# Patient Record
Sex: Female | Born: 1945 | Race: Black or African American | Hispanic: No | Marital: Married | Smoking: Never smoker
Health system: Southern US, Community
[De-identification: ages and names within clinical notes are randomized; demographics above are authoritative.]

## PROBLEM LIST (undated history)

## (undated) DIAGNOSIS — J189 Pneumonia, unspecified organism: Secondary | ICD-10-CM

## (undated) HISTORY — PX: ABDOMINAL HYSTERECTOMY: SHX81

---

## 1999-01-11 ENCOUNTER — Ambulatory Visit (HOSPITAL_COMMUNITY): Admission: RE | Admit: 1999-01-11 | Discharge: 1999-01-11 | Payer: Self-pay | Admitting: Family Medicine

## 1999-01-11 ENCOUNTER — Encounter: Payer: Self-pay | Admitting: Family Medicine

## 2013-07-12 ENCOUNTER — Emergency Department (HOSPITAL_COMMUNITY): Payer: Medicare Other

## 2013-07-12 ENCOUNTER — Inpatient Hospital Stay (HOSPITAL_COMMUNITY)
Admission: EM | Admit: 2013-07-12 | Discharge: 2013-07-14 | DRG: 195 | Disposition: A | Discharge status: Home / Self Care | Payer: Medicare Other | Attending: Internal Medicine | Admitting: Internal Medicine

## 2013-07-12 ENCOUNTER — Encounter (HOSPITAL_COMMUNITY): Payer: Self-pay | Admitting: Emergency Medicine

## 2013-07-12 ENCOUNTER — Observation Stay (HOSPITAL_COMMUNITY): Payer: Medicare Other

## 2013-07-12 DIAGNOSIS — Z8701 Personal history of pneumonia (recurrent): Secondary | ICD-10-CM

## 2013-07-12 DIAGNOSIS — I498 Other specified cardiac arrhythmias: Secondary | ICD-10-CM | POA: Diagnosis present

## 2013-07-12 DIAGNOSIS — Z8249 Family history of ischemic heart disease and other diseases of the circulatory system: Secondary | ICD-10-CM

## 2013-07-12 DIAGNOSIS — Z833 Family history of diabetes mellitus: Secondary | ICD-10-CM

## 2013-07-12 DIAGNOSIS — R7881 Bacteremia: Secondary | ICD-10-CM

## 2013-07-12 DIAGNOSIS — J189 Pneumonia, unspecified organism: Principal | ICD-10-CM

## 2013-07-12 DIAGNOSIS — Z823 Family history of stroke: Secondary | ICD-10-CM

## 2013-07-12 DIAGNOSIS — E876 Hypokalemia: Secondary | ICD-10-CM

## 2013-07-12 DIAGNOSIS — D649 Anemia, unspecified: Secondary | ICD-10-CM

## 2013-07-12 HISTORY — DX: Pneumonia, unspecified organism: J18.9

## 2013-07-12 LAB — COMPREHENSIVE METABOLIC PANEL
ALT: 12 U/L (ref 0–35)
AST: 17 U/L (ref 0–37)
Albumin: 3.2 g/dL — ABNORMAL LOW (ref 3.5–5.2)
Alkaline Phosphatase: 81 U/L (ref 39–117)
BUN: 18 mg/dL (ref 6–23)
CO2: 22 mEq/L (ref 19–32)
Calcium: 8.6 mg/dL (ref 8.4–10.5)
Chloride: 107 mEq/L (ref 96–112)
Creatinine, Ser: 0.75 mg/dL (ref 0.50–1.10)
GFR calc Af Amer: >90 mL/min (ref >90)
GFR calc non Af Amer: 87 mL/min — ABNORMAL LOW (ref >90)
Glucose, Bld: 142 mg/dL — ABNORMAL HIGH (ref 70–99)
Potassium: 3.3 mEq/L — ABNORMAL LOW (ref 3.5–5.1)
Sodium: 140 mEq/L (ref 135–145)
Total Bilirubin: 0.7 mg/dL (ref 0.3–1.2)
Total Protein: 7.3 g/dL (ref 6.0–8.3)

## 2013-07-12 LAB — CBC WITH DIFFERENTIAL/PLATELET
Basophils Absolute: 0 10*3/uL (ref 0.0–0.1)
Basophils Relative: 0 % (ref 0–1)
Eosinophils Absolute: 0 10*3/uL (ref 0.0–0.7)
Eosinophils Relative: 0 % (ref 0–5)
HCT: 32.6 % — ABNORMAL LOW (ref 36.0–46.0)
Hemoglobin: 11 g/dL — ABNORMAL LOW (ref 12.0–15.0)
Lymphocytes Relative: 6 % — ABNORMAL LOW (ref 12–46)
Lymphs Abs: 0.6 10*3/uL — ABNORMAL LOW (ref 0.7–4.0)
MCH: 29.3 pg (ref 26.0–34.0)
MCHC: 33.7 g/dL (ref 30.0–36.0)
MCV: 86.9 fL (ref 78.0–100.0)
Monocytes Absolute: 0.4 10*3/uL (ref 0.1–1.0)
Monocytes Relative: 4 % (ref 3–12)
Neutro Abs: 10.2 10*3/uL — ABNORMAL HIGH (ref 1.7–7.7)
Neutrophils Relative %: 91 % — ABNORMAL HIGH (ref 43–77)
Platelets: 192 10*3/uL (ref 150–400)
RBC: 3.75 MIL/uL — ABNORMAL LOW (ref 3.87–5.11)
RDW: 13.8 % (ref 11.5–15.5)
WBC: 11.2 10*3/uL — ABNORMAL HIGH (ref 4.0–10.5)

## 2013-07-12 LAB — INFLUENZA PANEL BY PCR (TYPE A & B)
Influenza A By PCR: NEGATIVE
Influenza B By PCR: NEGATIVE

## 2013-07-12 LAB — CG4 I-STAT (LACTIC ACID): Lactic Acid, Venous: 1.16 mmol/L (ref 0.5–2.2)

## 2013-07-12 LAB — HIV ANTIBODY (ROUTINE TESTING W REFLEX): HIV: NONREACTIVE

## 2013-07-12 LAB — TROPONIN I: Troponin I: <0.3 ng/mL (ref <0.30)

## 2013-07-12 MED ORDER — AZITHROMYCIN 250 MG PO TABS
500.0000 mg | ORAL_TABLET | Freq: Once | ORAL | Status: AC
  Administered 2013-07-12: 500 mg via ORAL
  Filled 2013-07-12: qty 2

## 2013-07-12 MED ORDER — SODIUM CHLORIDE 0.9 % IV SOLN
INTRAVENOUS | Status: AC
  Administered 2013-07-12: 23:00:00 via INTRAVENOUS

## 2013-07-12 MED ORDER — AZITHROMYCIN 500 MG PO TABS
500.0000 mg | ORAL_TABLET | ORAL | Status: DC
  Administered 2013-07-13 – 2013-07-14 (×2): 500 mg via ORAL
  Filled 2013-07-12 (×2): qty 1

## 2013-07-12 MED ORDER — SODIUM CHLORIDE 0.9 % IV BOLUS (SEPSIS)
500.0000 mL | Freq: Once | INTRAVENOUS | Status: AC
  Administered 2013-07-12: 500 mL via INTRAVENOUS

## 2013-07-12 MED ORDER — SODIUM CHLORIDE 0.9 % IV SOLN
INTRAVENOUS | Status: AC
  Administered 2013-07-12: 12:00:00 via INTRAVENOUS

## 2013-07-12 MED ORDER — NAPROXEN 500 MG PO TABS
500.0000 mg | ORAL_TABLET | Freq: Two times a day (BID) | ORAL | Status: DC
  Administered 2013-07-12 – 2013-07-14 (×5): 500 mg via ORAL
  Filled 2013-07-12 (×8): qty 1

## 2013-07-12 MED ORDER — ENOXAPARIN SODIUM 40 MG/0.4ML ~~LOC~~ SOLN
40.0000 mg | SUBCUTANEOUS | Status: DC
  Administered 2013-07-12 – 2013-07-14 (×3): 40 mg via SUBCUTANEOUS
  Filled 2013-07-12 (×3): qty 0.4

## 2013-07-12 MED ORDER — POTASSIUM CHLORIDE CRYS ER 20 MEQ PO TBCR
40.0000 meq | EXTENDED_RELEASE_TABLET | Freq: Once | ORAL | Status: AC
  Administered 2013-07-12: 40 meq via ORAL
  Filled 2013-07-12: qty 2

## 2013-07-12 MED ORDER — ONDANSETRON HCL 4 MG/2ML IJ SOLN
4.0000 mg | Freq: Once | INTRAMUSCULAR | Status: AC
  Administered 2013-07-12: 4 mg via INTRAVENOUS
  Filled 2013-07-12: qty 2

## 2013-07-12 MED ORDER — ALBUTEROL SULFATE HFA 108 (90 BASE) MCG/ACT IN AERS
2.0000 | INHALATION_SPRAY | Freq: Four times a day (QID) | RESPIRATORY_TRACT | Status: AC
  Administered 2013-07-12 (×2): 2 via RESPIRATORY_TRACT
  Filled 2013-07-12 (×2): qty 6.7

## 2013-07-12 MED ORDER — DEXTROSE 5 % IV SOLN
1.0000 g | INTRAVENOUS | Status: DC
  Filled 2013-07-12: qty 10

## 2013-07-12 MED ORDER — DEXTROSE 5 % IV SOLN
1.0000 g | Freq: Once | INTRAVENOUS | Status: AC
  Administered 2013-07-12: 1 g via INTRAVENOUS
  Filled 2013-07-12: qty 10

## 2013-07-12 MED ORDER — PNEUMOCOCCAL VAC POLYVALENT 25 MCG/0.5ML IJ INJ
0.5000 mL | INJECTION | INTRAMUSCULAR | Status: DC
  Filled 2013-07-12: qty 0.5

## 2013-07-12 MED ORDER — MORPHINE SULFATE 2 MG/ML IJ SOLN
2.0000 mg | Freq: Once | INTRAMUSCULAR | Status: AC
  Administered 2013-07-12: 2 mg via INTRAVENOUS
  Filled 2013-07-12: qty 1

## 2013-07-12 NOTE — Progress Notes (Signed)
Pharmacy consult to renal adjust antibiotics.  Patient currently on Rocephin and Azithromycin for pneumonia. No need for renal adjustment and SCr remains stable. All other medications are appropriately dosed.   Pharmacy will sign off. Please re- consult if needed.   Link Snuffer, PharmD, BCPS Clinical Pharmacist [phone] 07/12/2013, 11:24 AM

## 2013-07-12 NOTE — ED Provider Notes (Signed)
CSN: [id number]     Arrival date & time 07/12/13  0604 History   First MD Initiated Contact with Patient 07/12/13 [phone]     Chief Complaint  Patient presents with  . Chest Pain   (Consider location/radiation/quality/duration/timing/severity/associated sxs/prior Treatment) Patient is a 67 y.o. female presenting with chest pain. The history is provided by the patient.  Chest Pain Pain location:  Substernal area Pain quality: pressure and tightness   Pain radiates to:  Does not radiate Pain radiates to the back: no   Pain severity:  Moderate Onset quality:  Gradual Duration:  8 hours Timing:  Constant Progression:  Unchanged Chronicity:  New Context: breathing, movement and at rest   Relieved by:  Nothing Worsened by:  Nothing tried Ineffective treatments:  None tried Associated symptoms: cough, fever, nausea, shortness of breath and vomiting   Associated symptoms: no abdominal pain, no anxiety, no dizziness, no dysphagia, no headache, no numbness, no orthopnea, no syncope and no weakness    Patient brought to the ED by EMS with complains of chest pain with SOB, nausea and vomiting that started last night.  PMH is significant for abdominal hysterectomy. She denies having chronic medical conditions for which she takes medications for on a daily basis.  History reviewed. No pertinent past medical history. Past Surgical History  Procedure Laterality Date  . Abdominal hysterectomy     No family history on file. History  Substance Use Topics  . Smoking status: Never Smoker   . Smokeless tobacco: Not on file  . Alcohol Use: No   OB History   Grav Para Term Preterm Abortions TAB SAB Ect Mult Living                 Review of Systems  Constitutional: Positive for fever.  HENT: Negative for trouble swallowing.   Respiratory: Positive for cough and shortness of breath.   Cardiovascular: Positive for chest pain. Negative for orthopnea and syncope.  Gastrointestinal: Positive for  nausea and vomiting. Negative for abdominal pain.  Neurological: Negative for dizziness, weakness, numbness and headaches.    Allergies  Review of patient's allergies indicates no known allergies.  Home Medications  No current outpatient prescriptions on file. BP 112/61  Pulse 102  Temp(Src) 100.3 F (37.9 C) (Oral)  Resp 14  SpO2 92% Physical Exam  Nursing note and vitals reviewed. Constitutional: She appears well-developed and well-nourished. She appears distressed.  HENT:  Head: Normocephalic and atraumatic.  Eyes: Pupils are equal, round, and reactive to light.  Neck: Normal range of motion. Neck supple.  Cardiovascular: Regular rhythm.  Tachycardia present.   Abdominal: Soft. There is no tenderness. There is no guarding.  Neurological: She is alert.  Skin: Skin is warm and dry.    ED Course  Procedures (including critical care time) Labs Review Labs Reviewed  CBC WITH DIFFERENTIAL - Abnormal; Notable for the following:    WBC 11.2 (*)    RBC 3.75 (*)    Hemoglobin 11.0 (*)    HCT 32.6 (*)    Neutrophils Relative % 91 (*)    Neutro Abs 10.2 (*)    Lymphocytes Relative 6 (*)    Lymphs Abs 0.6 (*)    All other components within normal limits  COMPREHENSIVE METABOLIC PANEL - Abnormal; Notable for the following:    Potassium 3.3 (*)    Glucose, Bld 142 (*)    Albumin 3.2 (*)    GFR calc non Af Amer 87 (*)    All  other components within normal limits  PRO B NATRIURETIC PEPTIDE - Abnormal; Notable for the following:    Pro B Natriuretic peptide (BNP) 179.6 (*)    All other components within normal limits  CULTURE, BLOOD (ROUTINE X 2)  CULTURE, BLOOD (ROUTINE X 2)  TROPONIN I  URINALYSIS, ROUTINE W REFLEX MICROSCOPIC  INFLUENZA PANEL BY PCR  CG4 I-STAT (LACTIC ACID)   Imaging Review Dg Chest Port 1 View  07/12/2013   CLINICAL DATA:  Shortness of breath, cough, fever  EXAM: PORTABLE CHEST - 1 VIEW  COMPARISON:  None.  FINDINGS: Cardiac and mediastinal  silhouettes are within normal limits.  Lungs are normally inflated. Asymmetric parenchymal opacity is present within the left lower lobe, worrisome for pneumonia. A small left parapneumonic effusion is suspected. The right lung is clear. No pulmonary edema. No pneumothorax.  Osseous structures are within normal limits.  IMPRESSION: 1. Left lower lobe infiltrate, worrisome for pneumonia. 2. Suspect small left parapneumonic effusion.   Electronically Signed   By: Rise Mu M.D.   On: 07/12/2013 07:03    EKG Interpretation   None       MDM   1. CAP (community acquired pneumonia)    ALISA, STJAMES ZO:[id number] 12-Jul-2013 06:08:30 [facility]/ED ROUTINE RECORD Sinus tachycardia Otherwise normal ECG 43mm/s 56mm/mV 100Hz  8.0.1 12SL 237 CID: 2 Vent. rate 105 BPM PR interval 138 ms QRS duration 68 ms QT/QTc 318/420 ms P-R-T axes 72 63 74  Pt seen by Dr. Ranae Palms upon arrival and orders placed.  When I evaluated patient she was doing much better after treatment of Morphine, fluids, Zofran.  Labs and chest xray consistent with LLL pneumonia.   Patient looks significantly better after treatments. Admit as observations to Internal Medicine (unassigned) to Dr. Dalphine Handing. abx ordered in ED to cover for CAP.  Dorthula Matas, PA-C 07/12/13 [id number]  Dorthula Matas, PA-C 07/12/13 [id number]  Dorthula Matas, PA-C 07/12/13 [phone]

## 2013-07-12 NOTE — H&P (Signed)
Date: 07/12/2013               Patient Name:  Samantha [name] MRN: [id number]  DOB: 10/31/1945 Age / Sex: 67 y.o., female   PCP: No primary provider on file.         Medical Service: Internal Medicine Teaching Service         Attending Physician: Dr. Madalyn Rob    First Contact: Dr. Mariea Clonts Pager: [phone]  Second Contact: Dr. Zada Girt Pager: [phone]       After Hours (After 5p/  First Contact Pager: [phone]  weekends / holidays): Second Contact Pager: [phone]   Chief Complaint: Cough with chest pain  History of Present Illness:  Samantha [name] is a 67 yo woman without significant medical history who presents with clear sputum producing cough for the last 3-4 days accompanied with SOB and wheezing.  No fever or chills at home. No DOE. She vomited once yesterday after taking a "mixuture of meds" (lemon tea mixture for cold?), it was NB/NB.  No longer feels nauseous. No diarrhea/constipation.  No congestion or HA.  She notes associated worsening left sided, upper lumbar/lower thoracic back pain that started 2 days ago.  Not tender to palpation. Described as stabbing, 10/10 at worst, 7/10 now, tried tylenol & supporting back with pillow with minimal relief, worse with walking down the stairs.  She reports similar symptoms years ago when she had pneumonia.  She does not smoke. No flu shot this year.  Of note, the symptoms that she presented to me were described differently than symptoms presented to the EDP (substernal pressure/tightness that gradually worsened over the last 8 hours)  Meds: Current Facility-Administered Medications  Medication Dose Route Frequency Provider Last Rate Last Dose  . potassium chloride SA (K-DUR,KLOR-CON) CR tablet 40 mEq  40 mEq Oral Once Belia Heman, MD       Current Outpatient Prescriptions  Medication Sig Dispense Refill  . aspirin-sod bicarb-citric acid (ALKA-SELTZER) 325 MG TBEF tablet Take 325 mg by mouth every 6 (six) hours as  needed.        Allergies: Allergies as of 07/12/2013  . (No Known Allergies)   History reviewed. No pertinent past medical history.  Past Surgical History  Procedure Laterality Date  . Abdominal hysterectomy      20 years ago, due to fibroids    Family History  Problem Relation Age of Onset  . Stroke Mother     deceased  . Diabetes Mellitus II Mother     deceased  . Hypertension Mother   . Prostate cancer Father     History   Social History  . Marital Status: Married    Spouse Name: N/A    Number of Children: 3  . Years of Education: N/A   Occupational History  . Event Coordinator     [employer]   Social History Main Topics  . Smoking status: Never Smoker   . Smokeless tobacco: Not on file  . Alcohol Use: No  . Drug Use: No  . Sexual Activity: Not on file   Other Topics Concern  . Not on file   Social History Narrative   Has a biological son and daughter, also has an adopted son.  She lives with her husband. No pets.    Review of Systems: Constitutional: Denies appetite change; +fatigue.  HEENT: Denies photophobia, eye pain, redness, hearing loss, ear pain, congestion, sore throat, rhinorrhea, sneezing, mouth sores, trouble swallowing, neck  pain, neck stiffness and tinnitus.  Respiratory: per HPI Cardiovascular: Denies palpitations and leg swelling.  Gastrointestinal: per HPI Genitourinary: Denies dysuria, urgency, frequency, hematuria, flank pain and difficulty urinating.  Musculoskeletal: Denies myalgias, back pain, joint swelling, arthralgias and gait problem.  Skin: Denies pallor, rash and wound.  Neurological: Denies dizziness, seizures, syncope, weakness, lightheadedness, numbness and headaches.   Physical Exam: Blood pressure 112/61, pulse 102, temperature 100.3 F (37.9 C), temperature source Oral, resp. rate 14, SpO2 92.00%. General: resting in bed, appears as stated age  HEENT: PERRL, EOMI, no scleral icterus, dry MM Cardiac:  tachycardic, regular, no rubs, murmurs or gallops Pulm: soft upper airway expiratory wheezing with soft rales over LLL Abd: soft, nontender, nondistended, BS normoactive Ext/MSK: warm and well perfused, no pedal edema; no tenderness to palpation , palpable mass, or muscle contracture over left lower thoracic/upper lumbar region Neuro: alert and oriented X3, cranial nerves II-XII grossly intact   Lab results: Basic Metabolic Panel:  Recent Labs  16/10/96 0725  NA 140  K 3.3*  CL 107  CO2 22  GLUCOSE 142*  BUN 18  CREATININE 0.75  CALCIUM 8.6  AG 11  iStat Lactic Acid 1.16   Liver Function Tests:  Recent Labs  07/12/13 0725  AST 17  ALT 12  ALKPHOS 81  BILITOT 0.7  PROT 7.3  ALBUMIN 3.2*   CBC:  Recent Labs  07/12/13 0725  WBC 11.2*  NEUTROABS 10.2*  HGB 11.0*  HCT 32.6*  MCV 86.9  PLT 192   Cardiac Enzymes:  Recent Labs  07/12/13 0725  TROPONINI <0.30   BNP:  Recent Labs  07/12/13 0725  PROBNP 179.6*   Misc. Labs: Influenza panel - negative Blood cx - pending UA/micro: pending  Imaging results:  Dg Chest Port 1 View  07/12/2013   CLINICAL DATA:  Shortness of breath, cough, fever  EXAM: PORTABLE CHEST - 1 VIEW  COMPARISON:  None.  FINDINGS: Cardiac and mediastinal silhouettes are within normal limits.  Lungs are normally inflated. Asymmetric parenchymal opacity is present within the left lower lobe, worrisome for pneumonia. A small left parapneumonic effusion is suspected. The right lung is clear. No pulmonary edema. No pneumothorax.  Osseous structures are within normal limits.  IMPRESSION: 1. Left lower lobe infiltrate, worrisome for pneumonia. 2. Suspect small left parapneumonic effusion.   Electronically Signed   By: Rise Mu M.D.   On: 07/12/2013 07:03    Other results: EKG: sinus, regular, normal axis, HR 105, no significant ST/T wave changes, QTc 420, no prior for comparison  Assessment & Plan by Problem: 67 yo F admitted  on 07/12/13 without PMH with complaints of left back pain and cough.  #Community Acquired PNA: suggested by history, low grade fever & CXR.  Patient's PSI score is 65 (class II, 0.6% mortality) & CURB 65 is 1 (low severity, <3%).  Not hypoxic, but concerning pleural effusion on CXR. Patient with h/o PNA in the past.  No recent healthcare assoc exposures.  Influenza panel negative. -Admit to medsurg (obs) -Azithro/Ceftriaxone x 5d (consider d/c with just azithro) -Follow HIV & urine legionella/strep, blood cx, sputum gram stain results -Albuterol q6h x 2 given mild upper airway wheezing -Repeat 2V CXR, if pleural effusion persists, acquire lateral decubitus -Pneumovax  #Left sided back pain: Possibly referred from PNA/effusion.  Not TTP, so query MSK etiology.  UA pending to r/o UTI.  Symptoms have been persistent for >24h, and initial CE was negative, not suggestive of cardiac origin.  -  Follow UA results -Naproxen 500 BID  #Hypokalemia: mild. Not on medications that would cause this. No diarrhea. Possibly related to mild dehydration. Repleted. -Hydrate with NS @ 100cc/h -AM labs, if persistent, further workup with Mg  #VTE ppx: lovenox  Dispo: Disposition is deferred at this time, awaiting improvement of current medical problems. Anticipated discharge in approximately 1 day(s).   The patient does not have a current PCP (No primary provider on file.) and does need an [facility] hospital follow-up appointment after discharge.  The patient does not have transportation limitations that hinder transportation to clinic appointments.  Signed: Belia Heman, MD 07/12/2013, 8:33 AM

## 2013-07-12 NOTE — ED Notes (Signed)
Pt. reports mid chest pain onset last night with SOB , nausea and vomitting .

## 2013-07-12 NOTE — ED Notes (Signed)
Patient has been having a cough for several days.  Last night she started with pain to the left side up under her breast.  Respirations increased at times when she moves around.

## 2013-07-12 NOTE — H&P (Signed)
Internal Medicine Attending Admission Note Date: 07/12/2013  Patient name: Samantha [name] Medical record number: [id number] Date of birth: 12-24-1945 Age: 67 y.o. Gender: female  I saw and evaluated the patient and discussed her care with house staff. I reviewed the resident's note and I agree with the resident's findings and plan as documented in the resident's note, with the following additional comments.  Chief Complaint(s): Cough; left lateral chest pain aggravated by deep breathing or cough  History - key components related to admission: Patient is a 67 year old woman with past medical history of pneumonia admitted with three-day history of nonproductive cough with left-sided chest pain that started around 2 days ago.  The chest pain is aggravated by deep inspiration and cough; she has no pain when she is lying still.   Physical Exam - key components related to admission:  Filed Vitals:   07/12/13 0900 07/12/13 0916 07/12/13 0945 07/12/13 1351  BP: 119/65  134/70 99/45  Pulse: 89  90 92  Temp:  98.9 F (37.2 C) 99.5 F (37.5 C) 99.3 F (37.4 C)  TempSrc:  Oral Oral Oral  Resp: 32  20 22  SpO2: 96%  97% 96%    General: Alert, no distress Lungs: Few left basilar crackles Heart: Regular; no S3, no S4, no murmurs Abdomen: Bowel sounds present, soft, nontender Extremities: No edema  Lab results:   Basic Metabolic Panel:  Recent Labs  40/98/11 0725  NA 140  K 3.3*  CL 107  CO2 22  GLUCOSE 142*  BUN 18  CREATININE 0.75  CALCIUM 8.6    Liver Function Tests:  Recent Labs  07/12/13 0725  AST 17  ALT 12  ALKPHOS 81  BILITOT 0.7  PROT 7.3  ALBUMIN 3.2*    CBC:  Recent Labs  07/12/13 0725  WBC 11.2*  HGB 11.0*  HCT 32.6*  MCV 86.9  PLT 192    Recent Labs  07/12/13 0725  NEUTROABS 10.2*  LYMPHSABS 0.6*  MONOABS 0.4  EOSABS 0.0  BASOSABS 0.0    Cardiac Enzymes:  Recent Labs  07/12/13 0725  TROPONINI <0.30    BNP:  Recent Labs  07/12/13 0725  PROBNP 179.6*     Imaging results:  Dg Chest Port 1 View  07/12/2013   CLINICAL DATA:  Shortness of breath, cough, fever  EXAM: PORTABLE CHEST - 1 VIEW  COMPARISON:  None.  FINDINGS: Cardiac and mediastinal silhouettes are within normal limits.  Lungs are normally inflated. Asymmetric parenchymal opacity is present within the left lower lobe, worrisome for pneumonia. A small left parapneumonic effusion is suspected. The right lung is clear. No pulmonary edema. No pneumothorax.  Osseous structures are within normal limits.  IMPRESSION: 1. Left lower lobe infiltrate, worrisome for pneumonia. 2. Suspect small left parapneumonic effusion.   Electronically Signed   By: Rise Mu M.D.   On: 07/12/2013 07:03    Other results: EKG: Sinus tachycardia, rate 105, otherwise normal.  Assessment & Plan by Problem:  1.  Community-acquired pneumonia.  Plan is treat with ceftriaxone and azithromycin; await blood culture results; follow O2 saturations and supplement as indicated; get 2 view chest x-ray.  2.  Hypokalemia, mild.  Plan is replace potassium and follow.  3.  Left lateral chest pain.  Likely due to pneumonia.  Plan is treat symptomatically; repeat chest x-ray as above.

## 2013-07-13 LAB — URINALYSIS, ROUTINE W REFLEX MICROSCOPIC
Glucose, UA: NEGATIVE mg/dL
Hgb urine dipstick: NEGATIVE
Ketones, ur: 15 mg/dL — AB
Leukocytes, UA: NEGATIVE
Nitrite: NEGATIVE
Protein, ur: 30 mg/dL — AB
Specific Gravity, Urine: 1.035 — ABNORMAL HIGH (ref 1.005–1.030)
Urobilinogen, UA: 4 mg/dL — ABNORMAL HIGH (ref 0.0–1.0)
pH: 7 (ref 5.0–8.0)

## 2013-07-13 LAB — BASIC METABOLIC PANEL
CO2: 24 mEq/L (ref 19–32)
Chloride: 110 mEq/L (ref 96–112)
Creatinine, Ser: 0.76 mg/dL (ref 0.50–1.10)
Glucose, Bld: 96 mg/dL (ref 70–99)
Potassium: 4.6 mEq/L (ref 3.5–5.1)

## 2013-07-13 LAB — EXPECTORATED SPUTUM ASSESSMENT W GRAM STAIN, RFLX TO RESP C

## 2013-07-13 LAB — STREP PNEUMONIAE URINARY ANTIGEN: Strep Pneumo Urinary Antigen: POSITIVE — AB

## 2013-07-13 LAB — CBC
HCT: 29.9 % — ABNORMAL LOW (ref 36.0–46.0)
MCH: 29 pg (ref 26.0–34.0)
MCV: 89.5 fL (ref 78.0–100.0)
RDW: 14 % (ref 11.5–15.5)
WBC: 7.2 10*3/uL (ref 4.0–10.5)

## 2013-07-13 LAB — URINE MICROSCOPIC-ADD ON

## 2013-07-13 MED ORDER — DEXTROSE 5 % IV SOLN
2.0000 g | INTRAVENOUS | Status: DC
  Administered 2013-07-13 – 2013-07-14 (×2): 2 g via INTRAVENOUS
  Filled 2013-07-13 (×3): qty 2

## 2013-07-13 MED ORDER — VANCOMYCIN HCL IN DEXTROSE 1-5 GM/200ML-% IV SOLN
1000.0000 mg | Freq: Two times a day (BID) | INTRAVENOUS | Status: DC
  Administered 2013-07-13 – 2013-07-14 (×3): 1000 mg via INTRAVENOUS
  Filled 2013-07-13 (×4): qty 200

## 2013-07-13 NOTE — Progress Notes (Signed)
ANTIBIOTIC CONSULT NOTE - INITIAL  Pharmacy Consult for vancomycin Indication: rule out pneumonia  No Known Allergies  Patient Measurements: Height: 5' (152.4 cm) Weight: 160 lb (72.576 kg) IBW/kg (Calculated) : 45.5   Vital Signs: Temp: 97.6 F (36.4 C) (12/13 0544) Temp src: Oral (12/13 0544) BP: 108/56 mmHg (12/13 0544) Pulse Rate: 69 (12/13 0544) Intake/Output from previous day: 12/12 0701 - 12/13 0700 In: 850 [P.O.:120; IV Piggyback:50] Out: 500 [Urine:500] Intake/Output from this shift: Total I/O In: 240 [P.O.:240] Out: -   Labs:  Recent Labs  07/12/13 0725 07/13/13 0530  WBC 11.2* 7.2  HGB 11.0* 9.7*  PLT 192 208  CREATININE 0.75 0.76   Estimated Creatinine Clearance: 62.3 ml/min (by C-G formula based on Cr of 0.76). No results found for this basename: VANCOTROUGH, Leodis Binet, VANCORANDOM, GENTTROUGH, GENTPEAK, GENTRANDOM, TOBRATROUGH, TOBRAPEAK, TOBRARND, AMIKACINPEAK, AMIKACINTROU, AMIKACIN,  in the last 72 hours   Microbiology: Recent Results (from the past 720 hour(s))  CULTURE, BLOOD (ROUTINE X 2)     Status: None   Collection Time    07/12/13  7:15 AM      Result Value Range Status   Specimen Description BLOOD LEFT ANTECUBITAL   Final   Special Requests BOTTLES DRAWN AEROBIC AND ANAEROBIC   Final   Culture  Setup Time     Final   Value: 07/12/2013 13:56     Performed at [facility]   Culture     Final   Value: STREPTOCOCCUS SPECIES     0148 Note: Gram Stain Report Called to,Read Back By and Verified With: Leodis Binet 07/13/13 [facility]     Performed at [facility]   Report Status PENDING   Incomplete  CULTURE, BLOOD (ROUTINE X 2)     Status: None   Collection Time    07/12/13  7:30 AM      Result Value Range Status   Specimen Description BLOOD LEFT HAND   Final   Special Requests BOTTLES DRAWN AEROBIC AND ANAEROBIC   Final   Culture  Setup Time     Final   Value: 07/12/2013 13:56     Performed at [facility]   Culture     Final   Value: STREPTOCOCCUS SPECIES     0148 Note: Gram Stain Report Called to,Read Back By and Verified With: Leodis Binet 07/13/13 [facility]     Performed at [facility]   Report Status PENDING   Incomplete    Medical History: Past Medical History  Diagnosis Date  . Pneumonia 07/12/2013    Assessment: 67 YOF who was originally started on azithromycin 500mg  IV q24h and ceftriaxone 1g IV q24h for CAP. Now with positive blood cultures with strep species- IM spoke with ID and they recommended starting vancomycin and increasing ceftriaxone to 2g IV q24h and then narrowing as per sensitivities. SCr 0.76, CrCl ~45mL/min. WBC decr to 7.2 this morning from 11.2. Afebrile.  Goal of Therapy:  Vancomycin trough level 15-20 mcg/ml  Plan:  1. Vancomycin 1000mg  IV q12h 2. Azithromycin 500mg  PO q24 per MD 3. Ceftriaxone 2g IV q24 per MD 4. Follow up c/s, LOT, renal function, clinical progression, trough at [facility]  Shere Eisenhart D. Tyresa Prindiville, PharmD, BCPS Clinical Pharmacist Pager: [phone] 07/13/2013 11:37 AM

## 2013-07-13 NOTE — Progress Notes (Signed)
  I have seen and examined the patient myself, and I have reviewed the note by Floy Sabina, MS and was present during the interview and physical exam.  Please see my separate H&P for additional findings, assessment, and plan.   Signed: Kennis Carina, MD 07/13/2013, 12:30 PM

## 2013-07-13 NOTE — Progress Notes (Addendum)
Subjective: No complaints today. Lying in bed comfortably. Says chest pain has completely resolved. Cough has reduced in severity. No SOB or febrile episodes overnight. Slept well through the night.   Objective: Vital signs in last 24 hours: Filed Vitals:   07/12/13 1351 07/12/13 2124 07/12/13 2243 07/13/13 0544  BP: 99/45  113/58 108/56  Pulse: 92  78 69  Temp: 99.3 F (37.4 C)  97.8 F (36.6 C) 97.6 F (36.4 C)  TempSrc: Oral  Oral Oral  Resp: 22  20 20   Height:  5' (1.524 m)    Weight:  160 lb (72.576 kg)    SpO2: 96% 97% 98% 96%   Weight change:   Intake/Output Summary (Last 24 hours) at 07/13/13 1034 Last data filed at 07/13/13 0900  Gross per 24 hour  Intake   1090 ml  Output    500 ml  Net    590 ml   General appearance: alert, cooperative, appears as stated age and in no distress Lungs: clear to auscultation bilaterally Heart: regular rate and rhythm, S1, S2 normal, no murmur, click, rub or gallop Abdomen: soft, non-tender; bowel sounds normal; no masses,  no organomegaly Extremities: extremities normal, atraumatic, no cyanosis or edema Pulses: 2+ and symmetric, no pedal edema.  Lab Results: Basic Metabolic Panel:  Recent Labs Lab 07/12/13 0725 07/13/13 0530  NA 140 142  K 3.3* 4.6  CL 107 110  CO2 22 24  GLUCOSE 142* 96  BUN 18 18  CREATININE 0.75 0.76  CALCIUM 8.6 8.5   Liver Function Tests:  Recent Labs Lab 07/12/13 0725  AST 17  ALT 12  ALKPHOS 81  BILITOT 0.7  PROT 7.3  ALBUMIN 3.2*   CBC:  Recent Labs Lab 07/12/13 0725 07/13/13 0530  WBC 11.2* 7.2  NEUTROABS 10.2*  --   HGB 11.0* 9.7*  HCT 32.6* 29.9*  MCV 86.9 89.5  PLT 192 208   Cardiac Enzymes:  Recent Labs Lab 07/12/13 0725  TROPONINI <0.30   BNP:  Recent Labs Lab 07/12/13 0725  PROBNP 179.6*   Micro Results: Recent Results (from the past 240 hour(s))  CULTURE, BLOOD (ROUTINE X 2)     Status: None   Collection Time    07/12/13  7:15 AM      Result  Value Range Status   Specimen Description BLOOD LEFT ANTECUBITAL   Final   Special Requests BOTTLES DRAWN AEROBIC AND ANAEROBIC   Final   Culture  Setup Time     Final   Value: 07/12/2013 13:56     Performed at [facility]   Culture     Final   Value: STREPTOCOCCUS SPECIES     0148 Note: Gram Stain Report Called to,Read Back By and Verified With: Leodis Binet 07/13/13 [facility]     Performed at [facility]   Report Status PENDING   Incomplete  CULTURE, BLOOD (ROUTINE X 2)     Status: None   Collection Time    07/12/13  7:30 AM      Result Value Range Status   Specimen Description BLOOD LEFT HAND   Final   Special Requests BOTTLES DRAWN AEROBIC AND ANAEROBIC   Final   Culture  Setup Time     Final   Value: 07/12/2013 13:56     Performed at [facility]   Culture     Final   Value: STREPTOCOCCUS SPECIES     0148 Note: Gram Stain Report Called  to,Read Back By and Verified With: Leodis Binet 07/13/13 [facility]     Performed at [facility]   Report Status PENDING   Incomplete   Studies/Results: Dg Chest 2 View  07/12/2013   CLINICAL DATA:  Cough, pneumonia, pleural effusion  EXAM: CHEST  2 VIEW  COMPARISON.  IMPRESSION: Left lower lobe pneumonia with suspect small left pleural effusion.   Electronically Signed   By: Ulyses Southward M.D.   On: 07/12/2013 16:10   Dg Chest Port 1 View  07/12/2013   CLINICAL DATA:  Shortness of breath, cough, fever  EXAM: PORTABLE CHEST -   IMPRESSION: 1. Left lower lobe infiltrate, worrisome for pneumonia. 2. Suspect small left parapneumonic effusion.   Electronically Signed   By: Rise Mu M.D.   On: 07/12/2013 07:03   Medications: I have reviewed the patient's current medications. Scheduled Meds: . azithromycin  500 mg Oral Q24H  . cefTRIAXone (ROCEPHIN)  IV  2 g Intravenous Q24H  . enoxaparin (LOVENOX) injection  40 mg Subcutaneous Q24H  . naproxen  500 mg Oral BID WC  . pneumococcal 23 valent vaccine   0.5 mL Intramuscular Tomorrow-1000   Continuous Infusions:  PRN Meds:. Assessment/Plan: Principal Problem:   Community acquired pneumonia Active Problems:   Hypokalemia   Normocytic anemia   CAP (community acquired pneumonia)  #Community Acquired PNA: Supported by low grade fever, cough and chest xray findings. Patient's PSI score is 65 (class II, 0.6% mortality) & CURB 65 is 1 (low severity, <3%). Not hypoxic. Patient with h/o PNA in the past- ~15 years ago. No recent healthcare assoc exposures. Influenza panel negative. Blood cultures were positive for Gram positives in pairs. Pt was started on IV ceftriaxone-1g daily and oral Azithromycin -500mg  daily. But this was changed in the light of positive blood cultures and therefore bacteremia- Increased dose of ceftriazone to 2g daily. ID was also called to ask their opinion on antibiotics, recs were to commence Vanc, and then await sensitivity results to aid in narrowing coverage. SPO2- 96 % on room air.  -Continue Azithro, 500mg  daily, increase dose of Ceftriaxone to 2 g daily. - Start Vanc- as per pharmacy. - Repeat blood cultures tomorrow, and consider if TTE will be necessary though no murmurs heard on exam. - HIV  Neg. - Urine legionella/strep- Pending - Sputum gram stain pending.  - Albuterol q6h x 2 given mild upper airway wheezing  -Pneumovax  Vaccine before discharge. - Regular diet.  #Left sided back pain: Possibly referred from PNA . Resolved.. Symptoms have been persistent for >24h, and initial CE was negative, not suggestive of cardiac origin.  -Follow UA results- Pending. -Naproxen 500 BID   #Hypokalemia: mild, K- 3.3. Not on medications that would cause this. No diarrhea. Possibly related to mild dehydration. Repleted. BMP today- K- 4.6. -Taking orally so D/c NS @ 100cc/h.   #VTE ppx: lovenox  Dispo: Disposition is deferred at this time, awaiting improvement of current medical problems.   The patient does not know have  a current PCP (No primary provider on file.) and does not know need an [facility] hospital follow-up appointment after discharge.  The patient does not know have transportation limitations that hinder transportation to clinic appointments.  .Services Needed at time of discharge: Y = Yes, Blank = No PT:   OT:   RN:   Equipment:   Other:     LOS: 1 day   Samantha Carina, MD 07/13/2013, 10:34 AM

## 2013-07-13 NOTE — Progress Notes (Signed)
Subjective: Patient reports that she is feeling better today. She slept well overnight and reports that pain in left side has improved since yesterday. She she denies fever, chills, cough, SOB or chest pain. No dysuria, urinary frequency or urgency. No sore throat. BM are normal.   Objective: Vital signs in last 24 hours: Filed Vitals:   07/12/13 1351 07/12/13 2124 07/12/13 2243 07/13/13 0544  BP: 99/45  113/58 108/56  Pulse: 92  78 69  Temp: 99.3 F (37.4 C)  97.8 F (36.6 C) 97.6 F (36.4 C)  TempSrc: Oral  Oral Oral  Resp: 22  20 20   Height:  5' (1.524 m)    Weight:  72.576 kg (160 lb)    SpO2: 96% 97% 98% 96%   Weight change:   Intake/Output Summary (Last 24 hours) at 07/13/13 0947 Last data filed at 07/13/13 0600  Gross per 24 hour  Intake    850 ml  Output    500 ml  Net    350 ml   Physical Exam:  General: no acute distress. Resting in bed and eating breakfast. HEENT: pupils equal and reactive to light. Pink conjunctiva. Moist mucus membranes. No tosillar erythema or exudate.  CV: regular rate and rhythm. No murmurs.  Resp: Lungs clear to ascultation with crackles at bases of LLL. Breath sounds equal throughout. No wheezing or rhonchi. No increased work of breathing noted.  Abd: soft and non-distended. Non-tender. Bowel sounds present.  Skin: warm, dry and intact  Ext: no pretibial edema. 2+ radial or dorsalis pedis pulses bilaterally.   Lab Results: CBC    Component Value Date/Time   WBC 7.2 07/13/2013 0530   RBC 3.34* 07/13/2013 0530   HGB 9.7* 07/13/2013 0530   HCT 29.9* 07/13/2013 0530   PLT 208 07/13/2013 0530   MCV 89.5 07/13/2013 0530   MCH 29.0 07/13/2013 0530   MCHC 32.4 07/13/2013 0530   RDW 14.0 07/13/2013 0530   LYMPHSABS 0.6* 07/12/2013 0725   MONOABS 0.4 07/12/2013 0725   EOSABS 0.0 07/12/2013 0725   BASOSABS 0.0 07/12/2013 0725   BMET    Component Value Date/Time   NA 142 07/13/2013 0530   K 4.6 07/13/2013 0530   CL 110 07/13/2013  0530   CO2 24 07/13/2013 0530   GLUCOSE 96 07/13/2013 0530   BUN 18 07/13/2013 0530   CREATININE 0.76 07/13/2013 0530   CALCIUM 8.5 07/13/2013 0530   GFRNONAA 87* 07/13/2013 0530   GFRAA >90 07/13/2013 0530    Micro Results: Recent Results (from the past 240 hour(s))  CULTURE, BLOOD (ROUTINE X 2)     Status: None   Collection Time    07/12/13  7:15 AM      Result Value Range Status   Specimen Description BLOOD LEFT ANTECUBITAL   Final   Special Requests BOTTLES DRAWN AEROBIC AND ANAEROBIC   Final   Culture  Setup Time     Final   Value: 07/12/2013 13:56     Performed at [facility]   Culture     Final   Value: GRAM POSITIVE COCCI IN PAIRS     0148 Note: Gram Stain Report Called to,Read Back By and Verified With: Leodis Binet 07/13/13 [facility]     Performed at [facility]   Report Status PENDING   Incomplete  CULTURE, BLOOD (ROUTINE X 2)     Status: None   Collection Time    07/12/13  7:30 AM  Result Value Range Status   Specimen Description BLOOD LEFT HAND   Final   Special Requests BOTTLES DRAWN AEROBIC AND ANAEROBIC   Final   Culture  Setup Time     Final   Value: 07/12/2013 13:56     Performed at [facility]   Culture     Final   Value: GRAM POSITIVE COCCI IN PAIRS     0148 Note: Gram Stain Report Called to,Read Back By and Verified With: Leodis Binet 07/13/13 [facility]     Performed at [facility]   Report Status PENDING   Incomplete   Studies/Results: Dg Chest 2 View  07/12/2013   CLINICAL DATA:  Cough, pneumonia, pleural effusion  EXAM: CHEST  2 VIEW  COMPARISON:  Earlier portable exam of 07/12/2013 at 0638 hr  FINDINGS: Normal heart size, mediastinal contours, and pulmonary vascularity.  Mild peribronchial thickening.  Left lower lobe consolidation consistent with pneumonia.  Probable coexistent small left pleural effusion.  Remaining lungs clear.  No pneumothorax or acute osseous findings.  IMPRESSION: Left lower lobe  pneumonia with suspect small left pleural effusion.   Electronically Signed   By: Ulyses Southward M.D.   On: 07/12/2013 16:10   Dg Chest Port 1 View  07/12/2013   CLINICAL DATA:  Shortness of breath, cough, fever  EXAM: PORTABLE CHEST - 1 VIEW  COMPARISON:  None.  FINDINGS: Cardiac and mediastinal silhouettes are within normal limits.  Lungs are normally inflated. Asymmetric parenchymal opacity is present within the left lower lobe, worrisome for pneumonia. A small left parapneumonic effusion is suspected. The right lung is clear. No pulmonary edema. No pneumothorax.  Osseous structures are within normal limits.  IMPRESSION: 1. Left lower lobe infiltrate, worrisome for pneumonia. 2. Suspect small left parapneumonic effusion.   Electronically Signed   By: Rise Mu M.D.   On: 07/12/2013 07:03   Medications: I have reviewed the patient's current medications. Scheduled Meds: . azithromycin  500 mg Oral Q24H  . cefTRIAXone (ROCEPHIN)  IV  2 g Intravenous Q24H  . enoxaparin (LOVENOX) injection  40 mg Subcutaneous Q24H  . naproxen  500 mg Oral BID WC  . pneumococcal 23 valent vaccine  0.5 mL Intramuscular Tomorrow-1000   Continuous Infusions:  PRN Meds:. Assessment/Plan:  Samantha [name] is 67 y/o female with no significant PMH who presented with left sided pleuritic chest pain and cough, SOB of 3 days duration.   1) Community Acquired PNA: This is suggested by history of cough, SOB, low grade fever. CXR with left lower lobe infiltrate supports this diagnosis. Patient PSI score was 65 (class II, 0.6% mortality) & CURB 65 is 1 (low severity, <3%). Patient with has history PNA in the past. She has no recent healthcare assoc exposures. Influenza panel was negative. Patient is not hypoxic and remains on room air. She is not hypotensive and appears to be improving clinically.  - Continue azithromycin PO and ceftriaxone IV (started 07/13/13). See below. - HIV test was negative - Sputum gram stain  results pending  -Repeat 2V CXR, if pleural effusion persists, acquire lateral decubitus  -Pneumovax   2) Bacteremia: Preliminary blood culture results show gram positive cocci in pairs (x2). This suggests pneumococcal bacteremia. Speciation and antibiotic sensitivities are still pending. Patient had mild leukocytosis (11.2) on admission that has improved today (7.2)  after 2 doses ceftriaxone IV and 2 doses azithromycin PO. Patient remains afebrile, normotensive and clinically stable. Will continue supportive care and current antibiotic regimen as  patient continues to improve. Will follow blood cultures and sensitives and adjust antibiotics as necessary.  -Continue ceftriaxone IV 2 g/day (day 2)  -Continue azithromycin 500 mg/day (day 2) - Will add vancomycin pending sensitivities   3) Hypokalemia: Resolved. Potassium 3.3 on admission and has improved to 4.6 today after 40 mEq of potassium chloride (K-DUR) yesterday . Likely related to mild dehydration prior to admission. Not on medications (insulin, diuretics) that would cause this. No diarrhea.   FEN:  - Regular diet - Replete electrolytes as needed.   VTE ppx: lovenox  Dispo: Disposition is deferred at this time, awaiting improvement of current medical problems and need for IV antibiotics. Anticipated discharge in 1-3 days.   This is a Psychologist, occupational Note.  The care of the patient was discussed with Dr. Mariea Clonts and the assessment and plan formulated with their assistance.  Please see their attached note for official documentation of the daily encounter.   LOS: 1 day   Floy Sabina, Med Student 07/13/2013, 9:47 AM

## 2013-07-13 NOTE — Progress Notes (Signed)
    Day 1 of stay      Patient name: Samantha [name]  Medical record number: [id number]  Date of birth: [birth year]  67 y.o. female with left lower lobe pneumonia, with bacteremia (gram positive cocci in pairs). The patient appears to be doing well. She has no distress, no shortness of breath, not febrile, eating breakfast. Lungs have crackles over LLL. Heart not tachycardic, no murmurs. In view of bacteremia, I would continue IV antibiotics, and continue to keep her in the hospital, at least until the final results show up. I have seen and evaluated this patient and discussed it with my IM resident team.  Please see the rest of the plan per resident note from today.   Astria Jordahl 07/13/2013, 9:18 AM.

## 2013-07-13 NOTE — Progress Notes (Signed)
[facility] called to notify that blood cultures came back with gram + cocci in pairs.  2 aerobic and 2 anaerobic.  Teaching service made aware and gave orders to start Rocephin at 0300.

## 2013-07-13 NOTE — Care Management Note (Signed)
CARE MANAGEMENT NOTE 07/13/2013  Patient:  Samantha [name], Samantha [name]   Account Number:  [id number]  Date Initiated:  07/13/2013  Documentation initiated by:  Ricahrd Schwager  Subjective/Objective Assessment:   67 yo female admitted with CAP. No PCP on record.     Action/Plan:   Home when stable   Anticipated DC Date:     Anticipated DC Plan:        DC Planning Services  CM consult      Choice offered to / List presented to:     DME arranged  NA      DME agency  NA     HH arranged  NA      HH agency  NA   Status of service:  In process, will continue to follow Medicare Important Message given?   (If response is "NO", the following Medicare IM given date fields will be blank) Date Medicare IM given:   Date Additional Medicare IM given:    Discharge Disposition:    Per UR Regulation:  Reviewed for med. necessity/level of care/duration of stay  If discussed at Long Length of Stay Meetings, dates discussed:    Comments:  07/13/13 1257 Maryn Freelove,RN,MSN [phone] Chart reviewed for utilization of services.

## 2013-07-14 LAB — CULTURE, BLOOD (ROUTINE X 2)

## 2013-07-14 MED ORDER — CEFUROXIME AXETIL 500 MG PO TABS: 500.0000 mg | ORAL_TABLET | Freq: Two times a day (BID) | ORAL | Status: DC

## 2013-07-14 MED ORDER — PNEUMOCOCCAL VAC POLYVALENT 25 MCG/0.5ML IJ INJ
0.5000 mL | INJECTION | Freq: Once | INTRAMUSCULAR | Status: AC
  Administered 2013-07-14: 0.5 mL via INTRAMUSCULAR
  Filled 2013-07-14: qty 0.5

## 2013-07-14 NOTE — Discharge Summary (Signed)
Internal Medicine Teaching [facility] [place] Discharge Note  Name: Samantha [name] MRN: [id number] DOB: [birth year] 67 y.o.  Date of Admission: 07/12/2013  6:20 AM Date of Discharge: 07/14/2013 Attending Physician: Aletta Edouard, MD  Discharge Diagnosis: Principal Problem:   Community acquired pneumonia Active Problems:   Hypokalemia   Normocytic anemia   CAP (community acquired pneumonia)   Discharge Medications:   Medication List    STOP taking these medications       aspirin-sod bicarb-citric acid 325 MG Tbef tablet  Commonly known as:  ALKA-SELTZER      TAKE these medications       cefUROXime 500 MG tablet  Commonly known as:  CEFTIN  Take 1 tablet (500 mg total) by mouth 2 (two) times daily with a meal.        Disposition and follow-up:   [name] was discharged from [facility] in Good condition.  At the hospital follow up visit please address:  1. Please evaluate compliance with antibiotics 2. Consider chest x ray after 6 weeks around 08/22/2013. 3. Please follow up repeat blood cultures    Follow-up Appointments:  Discharge Orders   Future Orders Complete By Expires   Call MD for:  difficulty breathing, headache or visual disturbances  As directed    Call MD for:  persistant nausea and vomiting  As directed    Call MD for:  redness, tenderness, or signs of infection (pain, swelling, redness, odor or green/yellow discharge around incision site)  As directed    Call MD for:  severe uncontrolled pain  As directed    Call MD for:  temperature >100.4  As directed    Diet - low sodium heart healthy  As directed    Increase activity slowly  As directed       Consultations:    Procedures Performed:  Dg Chest 2 View  07/12/2013   CLINICAL DATA:  Cough, pneumonia, pleural effusion  EXAM: CHEST  2 VIEW  COMPARISON:  Earlier portable exam of 07/12/2013 at 0638 hr  FINDINGS: Normal heart size, mediastinal contours, and pulmonary  vascularity.  Mild peribronchial thickening.  Left lower lobe consolidation consistent with pneumonia.  Probable coexistent small left pleural effusion.  Remaining lungs clear.  No pneumothorax or acute osseous findings.  IMPRESSION: Left lower lobe pneumonia with suspect small left pleural effusion.   Electronically Signed   By: Ulyses Southward M.D.   On: 07/12/2013 16:10   Dg Chest Port 1 View  07/12/2013   CLINICAL DATA:  Shortness of breath, cough, fever  EXAM: PORTABLE CHEST - 1 VIEW  COMPARISON:  None.  FINDINGS: Cardiac and mediastinal silhouettes are within normal limits.  Lungs are normally inflated. Asymmetric parenchymal opacity is present within the left lower lobe, worrisome for pneumonia. A small left parapneumonic effusion is suspected. The right lung is clear. No pulmonary edema. No pneumothorax.  Osseous structures are within normal limits.  IMPRESSION: 1. Left lower lobe infiltrate, worrisome for pneumonia. 2. Suspect small left parapneumonic effusion.   Electronically Signed   By: Rise Mu M.D.   On: 07/12/2013 07:03     Admission HPI: Chief Complaint: Cough with chest pain  History of Present Illness:  Ms. Samantha [name] is a 67 yo woman without significant medical history who presents with clear sputum producing cough for the last 3-4 days accompanied with SOB and wheezing. No fever or chills at home. No DOE. She vomited once yesterday after taking a "mixuture  of meds" (lemon tea mixture for cold?), it was NB/NB. No longer feels nauseous. No diarrhea/constipation. No congestion or HA. She notes associated worsening left sided, upper lumbar/lower thoracic back pain that started 2 days ago. Not tender to palpation. Described as stabbing, 10/10 at worst, 7/10 now, tried tylenol & supporting back with pillow with minimal relief, worse with walking down the stairs. She reports similar symptoms years ago when she had pneumonia. She does not smoke. No flu shot this year.  Of note,  the symptoms that she presented to me were described differently than symptoms presented to the EDP (substernal pressure/tightness that gradually worsened over the last 8 hours)   Review of Systems:  Constitutional: Denies appetite change; +fatigue.  HEENT: Denies photophobia, eye pain, redness, hearing loss, ear pain, congestion, sore throat, rhinorrhea, sneezing, mouth sores, trouble swallowing, neck pain, neck stiffness and tinnitus.  Respiratory: per HPI  Cardiovascular: Denies palpitations and leg swelling.  Gastrointestinal: per HPI  Genitourinary: Denies dysuria, urgency, frequency, hematuria, flank pain and difficulty urinating.  Musculoskeletal: Denies myalgias, back pain, joint swelling, arthralgias and gait problem.  Skin: Denies pallor, rash and wound.  Neurological: Denies dizziness, seizures, syncope, weakness, lightheadedness, numbness and headaches.  Physical Exam:  Blood pressure 112/61, pulse 102, temperature 100.3 F (37.9 C), temperature source Oral, resp. rate 14, SpO2 92.00%.  General: resting in bed, appears as stated age  HEENT: PERRL, EOMI, no scleral icterus, dry MM  Cardiac: tachycardic, regular, no rubs, murmurs or gallops  Pulm: soft upper airway expiratory wheezing with soft rales over LLL  Abd: soft, nontender, nondistended, BS normoactive  Ext/MSK: warm and well perfused, no pedal edema; no tenderness to palpation , palpable mass, or muscle contracture over left lower thoracic/upper lumbar region  Neuro: alert and oriented X3, cranial nerves II-XII grossly intact  Hospital Course by problem list: Principal Problem:   Community acquired pneumonia Active Problems:   Hypokalemia   Normocytic anemia   CAP (community acquired pneumonia)  # Bacteremic CAP: Patient presented with low grade fever, cough and chest x-ray findings with left lower lobar infiltrates. She was not hypoxic. No recent healthcare assoc exposures. Influenza panel negative. Blood cultures  positive for S. pneumoniae which was pansensitive. Pt had been started on IV ceftriaxone-1g daily and oral Azithromycin 500mg  daily prior to blood culture results. Following blood culture results ceftriazone was increased to 2g daily and vancomycin was added after consultation with Dr Drue Second of infectious diseases. Patient clinically improved. Repeat blood cultures on day of discharge are pending. Again, discussed with Dr Drue Second after sensitivity results and she recommended switching to oral Cefuroxime 500 mg bid for a 14 days course. End day 07/23/2013. Repeat chest x ray for complete resolution of pneumonia can be done in outpatient in mid January 2015. She will follow up in Internal Medicine within 5 days. I will send a message to the front desk to contact patient for appointment.   Discharge Vitals:  BP 131/69  Pulse 76  Temp(Src) 98.6 F (37 C) (Oral)  Resp 20  Ht 5' (1.524 m)  Wt 160 lb (72.576 kg)  BMI 31.25 kg/m2  SpO2 97%   Signed: Dow Adolph 07/14/2013, 3:25 PM   Time Spent on Discharge: 45 minutes Services Ordered on Discharge: None  Equipment Ordered on Discharge: none

## 2013-07-14 NOTE — Progress Notes (Signed)
Subjective: No complaints today. Feeling better.   Objective: Vital signs in last 24 hours: Filed Vitals:   07/13/13 1409 07/13/13 2216 07/14/13 0634 07/14/13 1318  BP: 108/56 124/54 136/67 131/69  Pulse: 71 78 65 76  Temp: 98.2 F (36.8 C) 98.8 F (37.1 C) 98.3 F (36.8 C) 98.6 F (37 C)  TempSrc: Oral Oral Oral Oral  Resp: 18 20 22 20   Height:      Weight:      SpO2: 98% 96% 99% 97%   Weight change:   Intake/Output Summary (Last 24 hours) at 07/14/13 1524 Last data filed at 07/14/13 1400  Gross per 24 hour  Intake   1010 ml  Output      0 ml  Net   1010 ml   General appearance: alert, cooperative, appears as stated age and in no distress Lungs: clear to auscultation bilaterally Heart: regular rate and rhythm, S1, S2 normal, no murmur, click, rub or gallop Abdomen: soft, non-tender; bowel sounds normal; no masses,  no organomegaly Extremities: extremities normal, atraumatic, no cyanosis or edema Pulses: 2+ and symmetric, no pedal edema.  Lab Results: Basic Metabolic Panel:  Recent Labs Lab 07/12/13 0725 07/13/13 0530  NA 140 142  K 3.3* 4.6  CL 107 110  CO2 22 24  GLUCOSE 142* 96  BUN 18 18  CREATININE 0.75 0.76  CALCIUM 8.6 8.5   Liver Function Tests:  Recent Labs Lab 07/12/13 0725  AST 17  ALT 12  ALKPHOS 81  BILITOT 0.7  PROT 7.3  ALBUMIN 3.2*   CBC:  Recent Labs Lab 07/12/13 0725 07/13/13 0530  WBC 11.2* 7.2  NEUTROABS 10.2*  --   HGB 11.0* 9.7*  HCT 32.6* 29.9*  MCV 86.9 89.5  PLT 192 208   Cardiac Enzymes:  Recent Labs Lab 07/12/13 0725  TROPONINI <0.30   BNP:  Recent Labs Lab 07/12/13 0725  PROBNP 179.6*   Micro Results: Recent Results (from the past 240 hour(s))  CULTURE, BLOOD (ROUTINE X 2)     Status: None   Collection Time    07/12/13  7:15 AM      Result Value Range Status   Specimen Description BLOOD LEFT ANTECUBITAL   Final   Special Requests BOTTLES DRAWN AEROBIC AND ANAEROBIC   Final   Culture  Setup Time     Final   Value: 07/12/2013 13:56     Performed at [facility]   Culture     Final   Value: STREPTOCOCCUS PNEUMONIAE     Note: SUSCEPTIBILITIES PERFORMED ON PREVIOUS CULTURE WITHIN THE LAST 5 DAYS.     0148 Note: Gram Stain Report Called to,Read Back By and Verified With: Leodis Binet 07/13/13 [facility]     Performed at [facility]   Report Status 07/14/2013 FINAL   Final  CULTURE, BLOOD (ROUTINE X 2)     Status: None   Collection Time    07/12/13  7:30 AM      Result Value Range Status   Specimen Description BLOOD LEFT HAND   Final   Special Requests BOTTLES DRAWN AEROBIC AND ANAEROBIC   Final   Culture  Setup Time     Final   Value: 07/12/2013 13:56     Performed at [facility]   Culture     Final   Value: STREPTOCOCCUS PNEUMONIAE     0148 Note: Gram Stain Report Called to,Read Back By and Verified With: Leodis Binet 07/13/13 [facility]  Performed at [facility]   Report Status 07/14/2013 FINAL   Final   Organism ID, Bacteria STREPTOCOCCUS PNEUMONIAE   Final  CULTURE, EXPECTORATED SPUTUM-ASSESSMENT     Status: None   Collection Time    07/13/13 10:07 AM      Result Value Range Status   Specimen Description SPUTUM   Final   Special Requests NONE   Final   Sputum evaluation     Final   Value: THIS SPECIMEN IS ACCEPTABLE. RESPIRATORY CULTURE REPORT TO FOLLOW.   Report Status 07/13/2013 FINAL   Final  CULTURE, RESPIRATORY (NON-EXPECTORATED)     Status: None   Collection Time    07/13/13 10:07 AM      Result Value Range Status   Specimen Description SPUTUM   Final   Special Requests NONE   Final   Gram Stain PENDING   Incomplete   Culture     Final   Value: Culture reincubated for better growth     Performed at [facility]   Report Status PENDING   Incomplete   Studies/Results: Dg Chest 2 View  07/12/2013   CLINICAL DATA:  Cough, pneumonia, pleural effusion  EXAM: CHEST  2 VIEW  COMPARISON.  IMPRESSION: Left  lower lobe pneumonia with suspect small left pleural effusion.   Electronically Signed   By: Ulyses Southward M.D.   On: 07/12/2013 16:10   Dg Chest Port 1 View  07/12/2013   CLINICAL DATA:  Shortness of breath, cough, fever  EXAM: PORTABLE CHEST -   IMPRESSION: 1. Left lower lobe infiltrate, worrisome for pneumonia. 2. Suspect small left parapneumonic effusion.   Electronically Signed   By: Rise Mu M.D.   On: 07/12/2013 07:03   Medications: I have reviewed the patient's current medications. Scheduled Meds: . cefTRIAXone (ROCEPHIN)  IV  2 g Intravenous Q24H  . enoxaparin (LOVENOX) injection  40 mg Subcutaneous Q24H  . naproxen  500 mg Oral BID WC  . pneumococcal 23 valent vaccine  0.5 mL Intramuscular Once   Continuous Infusions:  PRN Meds:. Assessment/Plan: Principal Problem:   Community acquired pneumonia Active Problems:   Hypokalemia   Normocytic anemia   CAP (community acquired pneumonia)  # Bacteremic Community Acquired PNA: Supported by low grade fever, cough and chest xray findings. Patient's PSI score is 65 (class II, 0.6% mortality) & CURB 65 is 1 (low severity, <3%). Not hypoxic. Patient with h/o PNA in the past- ~15 years ago. No recent healthcare assoc exposures. Influenza panel negative. Blood cultures S. pneumoniae which was pansensitive. Pt had been started on IV ceftriaxone-1g daily and oral Azithromycin 500mg  daily prior to blood culture results. Following blood culture results ceftriazone was increased to 2g daily on per recommendation from vancomycin was added. Patient clinically improved after one day of antibiotics.   Plan  -discussed with Dr Anne Hahn of ID who recommended switching to oral Cefuroxiome for 14 days today course  - pt will be discharged  - Repeat blood cultures pending  - Urine strep- Positive - Albuterol q6h x 2 given mild upper airway wheezing  -Pneumovax  Vaccine before discharge. - Regular diet.  #Left sided back pain: Possibly referred  from PNA . Resolved.. Symptoms have been persistent for >24h, and initial CE was negative, not suggestive of cardiac origin.  -Follow UA results- Pending. -Naproxen 500 BID   #Hypokalemia- resolved. mild, K- 3.3. Not on medications that would cause this. No diarrhea. Possibly related to mild dehydration. Repleted.  #VTE ppx:  lovenox  Dispo: Disposition is deferred at this time, awaiting improvement of current medical problems.   The patient does not know have a current PCP (No primary provider on file.) and does not know need an [facility] hospital follow-up appointment after discharge.  The patient does not know have transportation limitations that hinder transportation to clinic appointments.  .Services Needed at time of discharge: Y = Yes, Blank = No PT:   OT:   RN:   Equipment:   Other:     LOS: 2 days   Dow Adolph, MD 07/14/2013, 3:24 PM

## 2013-07-15 LAB — CULTURE, RESPIRATORY

## 2013-07-15 LAB — LEGIONELLA ANTIGEN, URINE: Legionella Antigen, Urine: NEGATIVE

## 2013-07-15 LAB — CULTURE, RESPIRATORY W GRAM STAIN: Culture: NORMAL

## 2013-07-17 NOTE — Discharge Summary (Signed)
I evaluated Samantha [name] on the discharge day. I agree with the exam, assessment and plan of Dr. Zada Girt. I agree with the documentation in the note. Dr. Zada Girt has called the [facility] for setting up a discharge follow up appointment for the patient since she does not have a current PCP.

## 2013-07-20 LAB — CULTURE, BLOOD (ROUTINE X 2): Culture: NO GROWTH

## 2013-07-20 NOTE — ED Provider Notes (Signed)
Medical screening examination/treatment/procedure(s) were conducted as a shared visit with non-physician practitioner(s) and myself.  I personally evaluated the patient during the encounter 67 year old female presenting with chest pain, productive cough, fever and shortness of breath. Workup initiated for community acquired pneumonia. Will require hospitalization  Loren Racer, MD 07/20/13 [phone]

## 2013-07-23 ENCOUNTER — Encounter: Payer: Self-pay | Admitting: Internal Medicine

## 2013-07-23 ENCOUNTER — Ambulatory Visit (INDEPENDENT_AMBULATORY_CARE_PROVIDER_SITE_OTHER): Payer: Medicare Other | Admitting: Internal Medicine

## 2013-07-23 VITALS — BP 148/84 | HR 78 | Temp 97.3°F | Ht 60.0 in | Wt 162.1 lb

## 2013-07-23 DIAGNOSIS — D649 Anemia, unspecified: Secondary | ICD-10-CM

## 2013-07-23 DIAGNOSIS — J189 Pneumonia, unspecified organism: Secondary | ICD-10-CM

## 2013-07-23 NOTE — Patient Instructions (Signed)
-  You're doing great! Return in 1 month for routine follow up for regular health maintenance.  At that time, we will order a repeat Chest X-ray to make sure your pneumonia cleared.  Please be sure to bring all of your medications with you to every visit.  Should you have any new or worsening symptoms, please be sure to call the clinic at [phone].

## 2013-07-23 NOTE — Assessment & Plan Note (Signed)
Refer for colonoscopy at routine visit.

## 2013-07-23 NOTE — Assessment & Plan Note (Addendum)
Was complicated by strep pneum bacteremia, but repeat cx were negative.  ID rec ceftin, patient to complete tx tomorrow. Symptoms improving.  Return in 1 month for routine health maintenance and follow up CXR

## 2013-07-23 NOTE — Progress Notes (Signed)
   Subjective:   Patient ID: Samantha [name] female   DOB: 1946-02-22 67 y.o.   MRN: [id number]  HPI: [name] is a 67 y.o. woman with no PMH that was recently admitted 07/12/13-07/14/13 for LLL CAP.  Some persistent coughing with minimal sputum production, but improving overall. No night sweats, fevers or chills. Feels back to baseline.  Review of Systems: Constitutional: Denies diaphoresis, appetite change and fatigue.  HEENT: Denies photophobia, eye pain, redness, hearing loss, ear pain, congestion, sore throat, rhinorrhea, sneezing, mouth sores, trouble swallowing, neck pain, neck stiffness and tinnitus.  Respiratory: Denies SOB, DOE, chest tightness, and wheezing.  Cardiovascular: Denies chest pain, palpitations and leg swelling.  Gastrointestinal: Denies nausea, vomiting, abdominal pain, diarrhea, constipation,blood in stool and abdominal distention.  Genitourinary: Denies dysuria, urgency, frequency, hematuria, flank pain and difficulty urinating.  Musculoskeletal: Denies myalgias, back pain, joint swelling, arthralgias and gait problem.  Skin: Denies pallor, rash and wound.  Neurological: Denies dizziness, seizures, syncope, weakness, lightheadedness, numbness and headaches.    Past Medical History  Diagnosis Date  . Pneumonia 07/12/2013   Current Outpatient Prescriptions  Medication Sig Dispense Refill  . cefUROXime (CEFTIN) 500 MG tablet Take 1 tablet (500 mg total) by mouth 2 (two) times daily with a meal.  22 tablet  0   No current facility-administered medications for this visit.   Family History  Problem Relation Age of Onset  . Stroke Mother     deceased  . Diabetes Mellitus II Mother     deceased  . Hypertension Mother   . Prostate cancer Father    History   Social History  . Marital Status: Married    Spouse Name: N/A    Number of Children: 3  . Years of Education: N/A   Occupational History  . Event Coordinator     [employer]    Social History Main Topics  . Smoking status: Never Smoker   . Smokeless tobacco: Never Used  . Alcohol Use: No  . Drug Use: No  . Sexual Activity: None   Other Topics Concern  . None   Social History Narrative   Has a biological son and daughter, also has an adopted son.  She lives with her husband. No pets.     Objective:  Physical Exam: Filed Vitals:   07/23/13 1633  BP: 148/84  Pulse: 78  Temp: 97.3 F (36.3 C)  TempSrc: Oral  Height: 5' (1.524 m)  Weight: 162 lb 1.6 oz (73.528 kg)  SpO2: 99%   General: NAD, appears younger than stated age HEENT: PERRL, EOMI, no scleral icterus Cardiac: RRR, no rubs, murmurs or gallops Pulm: clear to auscultation bilaterally, moving normal volumes of air Abd: soft, nontender, nondistended, BS normoactive Ext: warm and well perfused, no pedal edema Neuro: alert and oriented X3, cranial nerves II-XII grossly intact  Assessment & Plan:  Case and care discussed with Dr. Josem Kaufmann.  Please see problem oriented charting for further details. Patient to return in 1 month for routine health maintenance follow up and CXR repeat.

## 2013-07-28 NOTE — Progress Notes (Signed)
Case discussed with Dr. Sharda soon after the resident saw the patient.  We reviewed the resident's history and exam and pertinent patient test results.  I agree with the assessment, diagnosis and plan of care documented in the resident's note. 

## 2016-05-18 ENCOUNTER — Emergency Department (HOSPITAL_COMMUNITY)
Admission: EM | Admit: 2016-05-18 | Discharge: 2016-05-18 | Disposition: A | Discharge status: Home / Self Care | Payer: Medicare HMO | Attending: Emergency Medicine | Admitting: Emergency Medicine

## 2016-05-18 ENCOUNTER — Encounter (HOSPITAL_COMMUNITY): Payer: Self-pay | Admitting: Emergency Medicine

## 2016-05-18 DIAGNOSIS — R112 Nausea with vomiting, unspecified: Secondary | ICD-10-CM | POA: Insufficient documentation

## 2016-05-18 DIAGNOSIS — R030 Elevated blood-pressure reading, without diagnosis of hypertension: Secondary | ICD-10-CM | POA: Diagnosis not present

## 2016-05-18 DIAGNOSIS — R42 Dizziness and giddiness: Secondary | ICD-10-CM | POA: Diagnosis not present

## 2016-05-18 MED ORDER — MECLIZINE HCL 25 MG PO TABS
25.0000 mg | ORAL_TABLET | Freq: Once | ORAL | Status: AC
  Administered 2016-05-18: 25 mg via ORAL
  Filled 2016-05-18: qty 1

## 2016-05-18 MED ORDER — MECLIZINE HCL 25 MG PO TABS: 25.0000 mg | ORAL_TABLET | Freq: Three times a day (TID) | ORAL | 0 refills | Status: DC | PRN

## 2016-05-18 NOTE — ED Provider Notes (Signed)
[place] DEPT Provider Note   CSN: [id number] Arrival date & time: 05/18/16  1054     History   Chief Complaint Chief Complaint  Patient presents with  . Vomiting    HPI RUE VALLADARES is a 70 y.o. female.  HPI complains of dizziness i.e. sensation of room spinning similar to vertigo that she's had in the past. Symptoms onset 6:30 AM today Symptoms accompanied by one episode of vomiting this morning. And nausea present. Symptoms worse with changing positions improved with remaining still. No treatment prior to coming here no other associated symptoms no treatment prior to coming here. No visual changes no focal numbness or weakness no difficulty with speech  Past Medical History:  Diagnosis Date  . Pneumonia 07/12/2013    Patient Active Problem List   Diagnosis Date Noted  . Community acquired pneumonia 07/12/2013  . Normocytic anemia 07/12/2013    Past Surgical History:  Procedure Laterality Date  . ABDOMINAL HYSTERECTOMY     20 years ago, due to fibroids     OB History    No data available       Home Medications    Prior to Admission medications   Medication Sig Start Date End Date Taking? Authorizing Provider  cefUROXime (CEFTIN) 500 MG tablet Take 1 tablet (500 mg total) by mouth 2 (two) times daily with a meal. 07/14/13   Jessee Avers, MD    Family History Family History  Problem Relation Age of Onset  . Stroke Mother     deceased  . Diabetes Mellitus II Mother     deceased  . Hypertension Mother   . Prostate cancer Father     Social History Social History  Substance Use Topics  . Smoking status: Never Smoker  . Smokeless tobacco: Never Used  . Alcohol use No     Allergies   Review of patient's allergies indicates no known allergies.   Review of Systems Review of Systems  Constitutional: Negative.   HENT: Negative.   Respiratory: Negative.   Cardiovascular: Negative.   Gastrointestinal: Positive for nausea and vomiting.    Musculoskeletal: Negative.   Skin: Negative.   Neurological: Positive for dizziness.  Psychiatric/Behavioral: Negative.   All other systems reviewed and are negative.    Physical Exam Updated Vital Signs BP 167/81 (BP Location: Right Arm)   Pulse 71   Temp 97.8 F (36.6 C) (Oral)   Resp 16   Ht '5\' 1"'$  (1.549 m)   Wt 173 lb (78.5 kg)   SpO2 97%   BMI 32.69 kg/m   Physical Exam  Constitutional: She is oriented to person, place, and time. She appears well-developed and well-nourished. No distress.  HENT:  Head: Normocephalic and atraumatic.  Bilateral tympanic membranes normal  Eyes: Conjunctivae are normal. Pupils are equal, round, and reactive to light.  Neck: Neck supple. No tracheal deviation present. No thyromegaly present.  Cardiovascular: Normal rate and regular rhythm.   No murmur heard. Pulmonary/Chest: Effort normal and breath sounds normal.  Abdominal: Soft. Bowel sounds are normal. She exhibits no distension. There is no tenderness.  Obese  Musculoskeletal: Normal range of motion. She exhibits no edema or tenderness.  Neurological: She is alert and oriented to person, place, and time. No cranial nerve deficit. Coordination normal.  DTRs symmetric bilaterally at knee jerk ankle jerk and biceps historical and bilaterally. Gait normal Romberg normal pronator drift normal finger to nose normal. Motor strength 5 over 5 overall  Skin: Skin is warm and dry.  No rash noted.  Psychiatric: She has a normal mood and affect.  Nursing note and vitals reviewed.    ED Treatments / Results  Labs (all labs ordered are listed, but only abnormal results are displayed) Labs Reviewed - No data to display  EKG  EKG Interpretation None       Radiology No results found.  Procedures Procedures (including critical care time)  Medications Ordered in ED Medications  meclizine (ANTIVERT) tablet 25 mg (25 mg Oral Given 05/18/16 1127)     Initial Impression / Assessment  and Plan / ED Course  I have reviewed the triage vital signs and the nursing notes.  Pertinent labs & imaging results that were available during my care of the patient were reviewed by me and considered in my medical decision making (see chart for details).  Clinical Course    Symptoms consistent with vertigo, peripheral in etiology Plan prescription meclizine. Referral Dr.Wolicki,ENT, referral [place]. Blood pressure recheck 3 weeks Final Clinical Impressions(s) / ED Diagnoses  Diagnosis #1 vertigo #2 elevated blood pressure Final diagnoses:  None    New Prescriptions New Prescriptions   No medications on file     Orlie Dakin, MD 05/18/16 1137

## 2016-05-18 NOTE — Discharge Instructions (Signed)
Take the medication prescribed as needed for dizziness. Call Dr.Wolicki to arrange to be seen in the office if not feeling better by next week. Call the [facility] today to get a primary care physician. Your blood pressure should be rechecked within the next 3 weeks. Today's was elevated at 162/63

## 2016-05-18 NOTE — ED Triage Notes (Signed)
States feels dizzyzy after vomiting today x 1 has an inner ear problem she states, no blood in vomit  Had eaten chips and crackers

## 2017-07-29 ENCOUNTER — Ambulatory Visit (HOSPITAL_COMMUNITY)
Admission: EM | Admit: 2017-07-29 | Discharge: 2017-07-29 | Disposition: A | Discharge status: Home / Self Care | Payer: Medicare HMO | Attending: Orthopedic Surgery | Admitting: Orthopedic Surgery

## 2017-07-29 ENCOUNTER — Encounter (HOSPITAL_COMMUNITY): Payer: Self-pay | Admitting: Emergency Medicine

## 2017-07-29 ENCOUNTER — Other Ambulatory Visit: Payer: Self-pay

## 2017-07-29 ENCOUNTER — Ambulatory Visit (HOSPITAL_COMMUNITY): Payer: Medicare HMO

## 2017-07-29 DIAGNOSIS — M25511 Pain in right shoulder: Secondary | ICD-10-CM

## 2017-07-29 DIAGNOSIS — M899 Disorder of bone, unspecified: Secondary | ICD-10-CM

## 2017-07-29 MED ORDER — TRAMADOL HCL 50 MG PO TABS: 50.0000 mg | ORAL_TABLET | Freq: Four times a day (QID) | ORAL | 0 refills | Status: DC | PRN

## 2017-07-29 NOTE — ED Triage Notes (Signed)
Pt states one week ago she woke up and her R arm was hurting. Painful with ROM, pain from wrist up to shoulder. No numbness.

## 2017-07-29 NOTE — ED Provider Notes (Signed)
[facility]    CSN: [id number] Arrival date & time: 07/29/17  1201     History   Chief Complaint Chief Complaint  Patient presents with  . Arm Pain    HPI Samantha [name] is a 71 y.o. female presents to the urgent care facility for evaluation of right shoulder pain.  She describes nontraumatic right shoulder pain is been present for 2 weeks.  She describes the pain as a deep ache from the shoulder down to the elbow.  No numbness tingling or radicular symptoms.  Patient has not been taking any medications for pain.  She decided to come in today because of pain last night.  She denies any chest pain, shortness of breath, fevers, redness or swelling.  No history of diabetes.  HPI  Past Medical History:  Diagnosis Date  . Pneumonia 07/12/2013    Patient Active Problem List   Diagnosis Date Noted  . Community acquired pneumonia 07/12/2013  . Normocytic anemia 07/12/2013    Past Surgical History:  Procedure Laterality Date  . ABDOMINAL HYSTERECTOMY     20 years ago, due to fibroids     OB History    No data available       Home Medications    Prior to Admission medications   Medication Sig Start Date End Date Taking? Authorizing Provider  cefUROXime (CEFTIN) 500 MG tablet Take 1 tablet (500 mg total) by mouth 2 (two) times daily with a meal. 07/14/13   Jessee Avers, MD  meclizine (ANTIVERT) 25 MG tablet Take 1 tablet (25 mg total) by mouth 3 (three) times daily as needed for dizziness. 05/18/16   Orlie Dakin, MD  traMADol (ULTRAM) 50 MG tablet Take 1 tablet (50 mg total) by mouth every 6 (six) hours as needed. 07/29/17   Duanne Guess, PA-C    Family History Family History  Problem Relation Age of Onset  . Stroke Mother        deceased  . Diabetes Mellitus II Mother        deceased  . Hypertension Mother   . Prostate cancer Father     Social History Social History   Tobacco Use  . Smoking status: Never Smoker  . Smokeless tobacco:  Never Used  Substance Use Topics  . Alcohol use: No  . Drug use: No     Allergies   Patient has no known allergies.   Review of Systems Review of Systems  Constitutional: Negative for fever.  Respiratory: Negative for shortness of breath.   Cardiovascular: Negative for chest pain.  Gastrointestinal: Negative for abdominal pain.  Genitourinary: Negative for difficulty urinating, dysuria and urgency.  Musculoskeletal: Positive for arthralgias. Negative for back pain and myalgias.  Skin: Negative for rash.  Neurological: Negative for dizziness and headaches.     Physical Exam Triage Vital Signs ED Triage Vitals [07/29/17 1214]  Enc Vitals Group     BP (!) 157/83     Pulse Rate 88     Resp 14     Temp 98.3 F (36.8 C)     Temp src      SpO2 97 %     Weight      Height      Head Circumference      Peak Flow      Pain Score 10     Pain Loc      Pain Edu?      Excl. in [place]?    No data found.  Updated Vital Signs BP (!) 157/83   Pulse 88   Temp 98.3 F (36.8 C)   Resp 14   SpO2 97%   Visual Acuity Right Eye Distance:   Left Eye Distance:   Bilateral Distance:    Right Eye Near:   Left Eye Near:    Bilateral Near:     Physical Exam  Constitutional: She appears well-developed and well-nourished.  HENT:  Head: Normocephalic and atraumatic.  Eyes: Conjunctivae and EOM are normal. Right eye exhibits no discharge. Left eye exhibits no discharge.  Neck: Normal range of motion.  Cardiovascular: Normal rate.  Pulmonary/Chest: Effort normal. No stridor. No respiratory distress. She has no wheezes. She has no rales. She exhibits no tenderness.  Musculoskeletal:  Examination of the right shoulder shows patient has tenderness along the shoulder, subacromial space and lateral deltoid.  She has limited active range of motion with full passive range of motion.  She has a positive drop arm test.  She has no warmth erythema noted.  She has painful and limited external  rotation.  She has full range of motion of the elbow wrist and digits.  She is neurovascular intact right upper extremity with normal sensation and 2+ radial pulse.  No swelling or edema throughout the upper extremity.  Nursing note and vitals reviewed.    UC Treatments / Results  Labs (all labs ordered are listed, but only abnormal results are displayed) Labs Reviewed - No data to display  EKG  EKG Interpretation None       Radiology Dg Shoulder Right  Result Date: 07/29/2017 CLINICAL DATA:  Right shoulder pain and limited range of motion for 2 weeks. No known injury. EXAM: RIGHT SHOULDER - 2+ VIEW COMPARISON:  None. FINDINGS: There is no evidence of acute fracture or dislocation. Generalized osteopenia is noted. A lytic bone lesion is seen involving the proximal humeral metadiaphysis, suspicious for bone metastasis. Lateral downsloping and degenerative spurring is seen involving the acromion process. 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 Electronically Signed   By: Earle Gell M.D.   On: 07/29/2017 12:58    Procedures Procedures (including critical care time)  Medications Ordered in UC Medications - No data to display   Initial Impression / Assessment and Plan / UC Course  I have reviewed the triage vital signs and the nursing notes.  Pertinent labs & imaging results that were available during my care of the patient were reviewed by me and considered in my medical decision making (see chart for details).     71 year old female with lytic lesion to the right proximal humerus.  No history of cancer.  Patient will contact oncologist Monday morning to schedule follow-up appointment.  Concerns and questions were addressed today in the urgent care facility.  She will return for any worsening symptoms or urgent changes in her health.  Final Clinical Impressions(s) / UC Diagnoses   Final diagnoses:  Acute pain of  right shoulder  Lytic lesion of bone on x-ray    ED Discharge Orders        Ordered    traMADol (ULTRAM) 50 MG tablet  Every 6 hours PRN     07/29/17 1308        Duanne Guess, [place] 07/29/17 1312

## 2017-07-29 NOTE — Discharge Instructions (Signed)
Please call Dr. Lindi Adie office first thing Monday morning to schedule follow-up appointment.  Please take tramadol as needed for pain.  Return to the emergency department or urgent care for any worsening symptoms or urgent changes in your health.

## 2017-08-07 ENCOUNTER — Other Ambulatory Visit: Payer: Self-pay

## 2017-08-07 ENCOUNTER — Encounter (HOSPITAL_COMMUNITY): Payer: Self-pay

## 2017-08-07 ENCOUNTER — Emergency Department (HOSPITAL_COMMUNITY)
Admission: EM | Admit: 2017-08-07 | Discharge: 2017-08-07 | Disposition: A | Discharge status: Home / Self Care | Payer: Medicare HMO | Attending: Emergency Medicine | Admitting: Emergency Medicine

## 2017-08-07 DIAGNOSIS — N390 Urinary tract infection, site not specified: Secondary | ICD-10-CM | POA: Insufficient documentation

## 2017-08-07 DIAGNOSIS — R110 Nausea: Secondary | ICD-10-CM | POA: Diagnosis present

## 2017-08-07 DIAGNOSIS — M899 Disorder of bone, unspecified: Secondary | ICD-10-CM | POA: Insufficient documentation

## 2017-08-07 LAB — CBC WITH DIFFERENTIAL/PLATELET
BASOS ABS: 0 10*3/uL (ref 0.0–0.1)
BASOS PCT: 0 %
Eosinophils Absolute: 0.1 10*3/uL (ref 0.0–0.7)
Eosinophils Relative: 1 %
HCT: 38.9 % (ref 36.0–46.0)
HEMOGLOBIN: 12.7 g/dL (ref 12.0–15.0)
Lymphocytes Relative: 36 %
Lymphs Abs: 2.7 10*3/uL (ref 0.7–4.0)
MCH: 28.8 pg (ref 26.0–34.0)
MCHC: 32.6 g/dL (ref 30.0–36.0)
MCV: 88.2 fL (ref 78.0–100.0)
Monocytes Absolute: 0.7 10*3/uL (ref 0.1–1.0)
Monocytes Relative: 9 %
NEUTROS PCT: 54 %
Neutro Abs: 4.1 10*3/uL (ref 1.7–7.7)
Platelets: 251 10*3/uL (ref 150–400)
RBC: 4.41 MIL/uL (ref 3.87–5.11)
RDW: 13.5 % (ref 11.5–15.5)
WBC: 7.6 10*3/uL (ref 4.0–10.5)

## 2017-08-07 LAB — I-STAT CHEM 8, ED
BUN: 17 mg/dL (ref 6–20)
CALCIUM ION: 1.21 mmol/L (ref 1.15–1.40)
CHLORIDE: 106 mmol/L (ref 101–111)
Creatinine, Ser: 0.6 mg/dL (ref 0.44–1.00)
GLUCOSE: 108 mg/dL — AB (ref 65–99)
HCT: 40 % (ref 36.0–46.0)
Hemoglobin: 13.6 g/dL (ref 12.0–15.0)
POTASSIUM: 3.9 mmol/L (ref 3.5–5.1)
Sodium: 141 mmol/L (ref 135–145)
TCO2: 22 mmol/L (ref 22–32)

## 2017-08-07 LAB — COMPREHENSIVE METABOLIC PANEL
ALT: 15 U/L (ref 14–54)
AST: 17 U/L (ref 15–41)
Albumin: 4 g/dL (ref 3.5–5.0)
Alkaline Phosphatase: 107 U/L (ref 38–126)
Anion gap: 7 (ref 5–15)
BUN: 17 mg/dL (ref 6–20)
CALCIUM: 9.6 mg/dL (ref 8.9–10.3)
CO2: 22 mmol/L (ref 22–32)
CREATININE: 0.74 mg/dL (ref 0.44–1.00)
Chloride: 108 mmol/L (ref 101–111)
GFR calc Af Amer: >60 mL/min (ref >60)
GFR calc non Af Amer: >60 mL/min (ref >60)
Glucose, Bld: 109 mg/dL — ABNORMAL HIGH (ref 65–99)
Potassium: 3.9 mmol/L (ref 3.5–5.1)
Sodium: 137 mmol/L (ref 135–145)
Total Bilirubin: 0.6 mg/dL (ref 0.3–1.2)
Total Protein: 7.9 g/dL (ref 6.5–8.1)

## 2017-08-07 LAB — URINALYSIS, ROUTINE W REFLEX MICROSCOPIC
Bilirubin Urine: NEGATIVE
GLUCOSE, UA: NEGATIVE mg/dL
Ketones, ur: 5 mg/dL — AB
NITRITE: NEGATIVE
PROTEIN: NEGATIVE mg/dL
Specific Gravity, Urine: 1.027 (ref 1.005–1.030)
pH: 5 (ref 5.0–8.0)

## 2017-08-07 LAB — I-STAT TROPONIN, ED: Troponin i, poc: 0 ng/mL (ref 0.00–0.08)

## 2017-08-07 MED ORDER — CEPHALEXIN 500 MG PO CAPS: 500.0000 mg | ORAL_CAPSULE | Freq: Three times a day (TID) | ORAL | 0 refills | Status: AC

## 2017-08-07 MED ORDER — ONDANSETRON 4 MG PO TBDP
4.0000 mg | ORAL_TABLET | Freq: Once | ORAL | Status: AC
  Administered 2017-08-07: 4 mg via ORAL
  Filled 2017-08-07: qty 1

## 2017-08-07 MED ORDER — ONDANSETRON 4 MG PO TBDP: 4.0000 mg | ORAL_TABLET | Freq: Three times a day (TID) | ORAL | 0 refills | Status: AC | PRN

## 2017-08-07 NOTE — ED Provider Notes (Signed)
[facility] DEPT Provider Note   CSN: [id number] Arrival date & time: 08/07/17  1011     History   Chief Complaint Chief Complaint  Patient presents with  . Arm Pain  . Nausea    HPI Samantha [name] is a 72 y.o. female.  HPI   72 year old female who presents with a concern for right shoulder pain and nausea.  She presented approximately 1 week ago to urgent care with the same concern.  Given x-ray at that time that had showed lytic lesion of the bone, for which they recommended outpatient work up given concern for possible metastatic disease. Reports pain has been present for 2-3 weeks, worse with palpation and movement.  Beginning approx 3 days ago she has had nausea.  No vomiting or diarrhea. No fevers. No neck pain. No weakness.  Tried tramadol but it made her feel sick.  No abdominal pain, chest pain or dyspnea.   Past Medical History:  Diagnosis Date  . Pneumonia 07/12/2013    Patient Active Problem List   Diagnosis Date Noted  . Community acquired pneumonia 07/12/2013  . Normocytic anemia 07/12/2013    Past Surgical History:  Procedure Laterality Date  . ABDOMINAL HYSTERECTOMY     20 years ago, due to fibroids     OB History    No data available       Home Medications    Prior to Admission medications   Medication Sig Start Date End Date Taking? Authorizing Provider  acetaminophen (TYLENOL) 500 MG tablet Take 500 mg by mouth every 6 (six) hours as needed for moderate pain.   Yes [provider]  cefUROXime (CEFTIN) 500 MG tablet Take 1 tablet (500 mg total) by mouth 2 (two) times daily with a meal. Patient not taking: Reported on 08/07/2017 07/14/13   Jessee Avers, MD  cephALEXin (KEFLEX) 500 MG capsule Take 1 capsule (500 mg total) by mouth 3 (three) times daily for 7 days. 08/07/17 08/14/17  Gareth Morgan, MD  meclizine (ANTIVERT) 25 MG tablet Take 1 tablet (25 mg total) by mouth 3 (three) times daily as needed  for dizziness. Patient not taking: Reported on 08/07/2017 05/18/16   Orlie Dakin, MD  ondansetron (ZOFRAN ODT) 4 MG disintegrating tablet Take 1 tablet (4 mg total) by mouth every 8 (eight) hours as needed for nausea or vomiting. 08/07/17   Gareth Morgan, MD  traMADol (ULTRAM) 50 MG tablet Take 1 tablet (50 mg total) by mouth every 6 (six) hours as needed. Patient not taking: Reported on 08/07/2017 07/29/17   Duanne Guess, PA-C    Family History Family History  Problem Relation Age of Onset  . Stroke Mother        deceased  . Diabetes Mellitus II Mother        deceased  . Hypertension Mother   . Prostate cancer Father     Social History Social History   Tobacco Use  . Smoking status: Never Smoker  . Smokeless tobacco: Never Used  Substance Use Topics  . Alcohol use: No  . Drug use: No     Allergies   Patient has no known allergies.   Review of Systems Review of Systems  Constitutional: Negative for fever.  HENT: Negative for sore throat.   Eyes: Negative for visual disturbance.  Respiratory: Negative for cough and shortness of breath.   Cardiovascular: Negative for chest pain.  Gastrointestinal: Positive for nausea and vomiting (on saturday). Negative for abdominal pain.  Genitourinary: Negative for difficulty urinating.  Musculoskeletal: Positive for arthralgias. Negative for back pain and neck pain.  Skin: Negative for rash.  Neurological: Negative for syncope and headaches.     Physical Exam Updated Vital Signs BP (!) 141/69   Pulse 86   Temp 98.7 F (37.1 C) (Oral)   Resp (!) 28   Ht 5\' 1"  (1.549 m)   Wt 78.5 kg (173 lb)   SpO2 100%   BMI 32.69 kg/m   Physical Exam  Constitutional: She is oriented to person, place, and time. She appears well-developed and well-nourished. No distress.  HENT:  Head: Normocephalic and atraumatic.  Eyes: Conjunctivae and EOM are normal.  Neck: Normal range of motion.  Cardiovascular: Normal rate, regular  rhythm, normal heart sounds and intact distal pulses. Exam reveals no gallop and no friction rub.  No murmur heard. Pulmonary/Chest: Effort normal and breath sounds normal. No respiratory distress. She has no wheezes. She has no rales.  Abdominal: Soft. She exhibits no distension. There is no tenderness. There is no guarding.  Musculoskeletal: She exhibits no edema or tenderness.       Right shoulder: She exhibits bony tenderness.  Neurological: She is alert and oriented to person, place, and time.  Normal UE strength and sensation  Skin: Skin is warm and dry. No rash noted. She is not diaphoretic. No erythema.  Nursing note and vitals reviewed.    ED Treatments / Results  Labs (all labs ordered are listed, but only abnormal results are displayed) Labs Reviewed  COMPREHENSIVE METABOLIC PANEL - Abnormal; Notable for the following components:      Result Value   Glucose, Bld 109 (*)    All other components within normal limits  URINALYSIS, ROUTINE W REFLEX MICROSCOPIC - Abnormal; Notable for the following components:   Hgb urine dipstick MODERATE (*)    Ketones, ur 5 (*)    Leukocytes, UA SMALL (*)    Bacteria, UA RARE (*)    Squamous Epithelial / LPF 0-5 (*)    All other components within normal limits  I-STAT CHEM 8, ED - Abnormal; Notable for the following components:   Glucose, Bld 108 (*)    All other components within normal limits  URINE CULTURE  CBC WITH DIFFERENTIAL/PLATELET  I-STAT TROPONIN, ED    EKG  EKG Interpretation  Date/Time:  Monday August 07 2017 19:52:11 EST Ventricular Rate:  84 PR Interval:    QRS Duration: 78 QT Interval:  360 QTC Calculation: 426 R Axis:   47 Text Interpretation:  Sinus rhythm Abnormal R-wave progression, early transition No significant change since last tracing Confirmed by Gareth Morgan [phone]) on 08/07/2017 8:20:11 PM       Radiology No results found.  Procedures Procedures (including critical care time)  Medications  Ordered in ED Medications  ondansetron (ZOFRAN-ODT) disintegrating tablet 4 mg (4 mg Oral Given 08/07/17 2141)     Initial Impression / Assessment and Plan / ED Course  I have reviewed the triage vital signs and the nursing notes.  Pertinent labs & imaging results that were available during my care of the patient were reviewed by me and considered in my medical decision making (see chart for details).     72 year old female presents with a concern for right shoulder pain and nausea.  No abdominal pain to suggest intraabdominal etiology of shoulder pain. No chest pain, no dyspnea, EKG without acute changes and troponin negative and doubt ACS. No headache to suggest intracranial etiology of nausea.  XR last week showed concern for possible metastatic disease with lytic lesion to shoulder and feel this is likely etiology of the pain.  Labs show no other acute process.  Urinalysis concerning for UTI as etiology of nausea.  Recommend outpatient follow up for lytic lesion, arm pain and nausea. Given rx for keflex and zofran. Patient discharged in stable condition with understanding of reasons to return.   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

## 2017-08-07 NOTE — ED Triage Notes (Signed)
Patient c/o right arm pain x 3 weeks. fpatient states she woke up 3 weeks ago and arm has been hurting since. Patient denies any injury or heavy lifting. Patient also c/o nausea, but no vomiting x 3 days.

## 2017-08-07 NOTE — ED Notes (Addendum)
Pt aware of need for urine specimen but does not need to go at this time. RN aware.

## 2017-08-09 LAB — URINE CULTURE

## 2017-08-21 ENCOUNTER — Other Ambulatory Visit: Payer: Self-pay | Admitting: Internal Medicine

## 2017-08-24 ENCOUNTER — Other Ambulatory Visit: Payer: Self-pay | Admitting: Internal Medicine

## 2017-08-24 DIAGNOSIS — M899 Disorder of bone, unspecified: Secondary | ICD-10-CM

## 2017-08-30 ENCOUNTER — Telehealth: Payer: Self-pay | Admitting: Oncology

## 2017-08-30 ENCOUNTER — Encounter: Payer: Self-pay | Admitting: Oncology

## 2017-08-30 NOTE — Telephone Encounter (Signed)
Spoke to the pt's granddaughter to schedule an appt for her grandmother to see Dr. Alen Blew on 2/20 at 2pm. She's aware to arrive 30 minutes early. Letter mailed to the pt and faxed to the referring office.

## 2017-08-31 ENCOUNTER — Other Ambulatory Visit: Payer: Self-pay

## 2017-08-31 ENCOUNTER — Other Ambulatory Visit: Payer: Medicare HMO

## 2017-08-31 ENCOUNTER — Inpatient Hospital Stay (HOSPITAL_COMMUNITY)
Admission: EM | Admit: 2017-08-31 | Discharge: 2017-09-15 | DRG: 181 | Disposition: A | Discharge status: Hospice | Payer: Medicare HMO | Attending: Internal Medicine | Admitting: Internal Medicine

## 2017-08-31 ENCOUNTER — Encounter (HOSPITAL_COMMUNITY): Payer: Self-pay | Admitting: Emergency Medicine

## 2017-08-31 ENCOUNTER — Emergency Department (HOSPITAL_COMMUNITY): Payer: Medicare HMO

## 2017-08-31 DIAGNOSIS — Z823 Family history of stroke: Secondary | ICD-10-CM

## 2017-08-31 DIAGNOSIS — C3491 Malignant neoplasm of unspecified part of right bronchus or lung: Principal | ICD-10-CM | POA: Diagnosis present

## 2017-08-31 DIAGNOSIS — Z515 Encounter for palliative care: Secondary | ICD-10-CM | POA: Diagnosis present

## 2017-08-31 DIAGNOSIS — Z09 Encounter for follow-up examination after completed treatment for conditions other than malignant neoplasm: Secondary | ICD-10-CM

## 2017-08-31 DIAGNOSIS — Z8042 Family history of malignant neoplasm of prostate: Secondary | ICD-10-CM | POA: Diagnosis not present

## 2017-08-31 DIAGNOSIS — C7931 Secondary malignant neoplasm of brain: Secondary | ICD-10-CM | POA: Diagnosis present

## 2017-08-31 DIAGNOSIS — R112 Nausea with vomiting, unspecified: Secondary | ICD-10-CM | POA: Diagnosis not present

## 2017-08-31 DIAGNOSIS — Z8249 Family history of ischemic heart disease and other diseases of the circulatory system: Secondary | ICD-10-CM | POA: Diagnosis not present

## 2017-08-31 DIAGNOSIS — Z90710 Acquired absence of both cervix and uterus: Secondary | ICD-10-CM | POA: Diagnosis not present

## 2017-08-31 DIAGNOSIS — Z8701 Personal history of pneumonia (recurrent): Secondary | ICD-10-CM

## 2017-08-31 DIAGNOSIS — J90 Pleural effusion, not elsewhere classified: Secondary | ICD-10-CM

## 2017-08-31 DIAGNOSIS — M8580 Other specified disorders of bone density and structure, unspecified site: Secondary | ICD-10-CM | POA: Diagnosis not present

## 2017-08-31 DIAGNOSIS — R51 Headache: Secondary | ICD-10-CM | POA: Diagnosis not present

## 2017-08-31 DIAGNOSIS — C7949 Secondary malignant neoplasm of other parts of nervous system: Secondary | ICD-10-CM | POA: Diagnosis present

## 2017-08-31 DIAGNOSIS — Z9889 Other specified postprocedural states: Secondary | ICD-10-CM | POA: Diagnosis not present

## 2017-08-31 DIAGNOSIS — M899 Disorder of bone, unspecified: Secondary | ICD-10-CM | POA: Diagnosis not present

## 2017-08-31 DIAGNOSIS — G939 Disorder of brain, unspecified: Secondary | ICD-10-CM | POA: Diagnosis not present

## 2017-08-31 DIAGNOSIS — C7951 Secondary malignant neoplasm of bone: Secondary | ICD-10-CM | POA: Diagnosis present

## 2017-08-31 DIAGNOSIS — C779 Secondary and unspecified malignant neoplasm of lymph node, unspecified: Secondary | ICD-10-CM | POA: Diagnosis not present

## 2017-08-31 DIAGNOSIS — R918 Other nonspecific abnormal finding of lung field: Secondary | ICD-10-CM | POA: Diagnosis not present

## 2017-08-31 DIAGNOSIS — C3490 Malignant neoplasm of unspecified part of unspecified bronchus or lung: Secondary | ICD-10-CM | POA: Diagnosis not present

## 2017-08-31 DIAGNOSIS — R0902 Hypoxemia: Secondary | ICD-10-CM | POA: Diagnosis not present

## 2017-08-31 DIAGNOSIS — Z79899 Other long term (current) drug therapy: Secondary | ICD-10-CM | POA: Diagnosis not present

## 2017-08-31 DIAGNOSIS — J910 Malignant pleural effusion: Secondary | ICD-10-CM | POA: Diagnosis present

## 2017-08-31 DIAGNOSIS — Z7901 Long term (current) use of anticoagulants: Secondary | ICD-10-CM

## 2017-08-31 DIAGNOSIS — G9619 Other disorders of meninges, not elsewhere classified: Secondary | ICD-10-CM | POA: Diagnosis not present

## 2017-08-31 DIAGNOSIS — Z66 Do not resuscitate: Secondary | ICD-10-CM | POA: Diagnosis present

## 2017-08-31 DIAGNOSIS — G44209 Tension-type headache, unspecified, not intractable: Secondary | ICD-10-CM

## 2017-08-31 DIAGNOSIS — I82409 Acute embolism and thrombosis of unspecified deep veins of unspecified lower extremity: Secondary | ICD-10-CM | POA: Diagnosis not present

## 2017-08-31 DIAGNOSIS — Z7189 Other specified counseling: Secondary | ICD-10-CM

## 2017-08-31 DIAGNOSIS — Z86718 Personal history of other venous thrombosis and embolism: Secondary | ICD-10-CM

## 2017-08-31 DIAGNOSIS — R599 Enlarged lymph nodes, unspecified: Secondary | ICD-10-CM | POA: Diagnosis not present

## 2017-08-31 DIAGNOSIS — C3431 Malignant neoplasm of lower lobe, right bronchus or lung: Secondary | ICD-10-CM | POA: Diagnosis not present

## 2017-08-31 DIAGNOSIS — C799 Secondary malignant neoplasm of unspecified site: Secondary | ICD-10-CM | POA: Diagnosis not present

## 2017-08-31 DIAGNOSIS — R519 Headache, unspecified: Secondary | ICD-10-CM

## 2017-08-31 LAB — COMPREHENSIVE METABOLIC PANEL
ALT: 14 U/L (ref 14–54)
ANION GAP: 13 (ref 5–15)
AST: 20 U/L (ref 15–41)
Albumin: 3.9 g/dL (ref 3.5–5.0)
Alkaline Phosphatase: 107 U/L (ref 38–126)
BUN: 12 mg/dL (ref 6–20)
CHLORIDE: 103 mmol/L (ref 101–111)
CO2: 20 mmol/L — AB (ref 22–32)
Calcium: 9.6 mg/dL (ref 8.9–10.3)
Creatinine, Ser: 0.79 mg/dL (ref 0.44–1.00)
GFR calc Af Amer: >60 mL/min (ref >60)
Glucose, Bld: 115 mg/dL — ABNORMAL HIGH (ref 65–99)
POTASSIUM: 4.1 mmol/L (ref 3.5–5.1)
SODIUM: 136 mmol/L (ref 135–145)
Total Bilirubin: 0.6 mg/dL (ref 0.3–1.2)
Total Protein: 7.3 g/dL (ref 6.5–8.1)

## 2017-08-31 LAB — CBC
HCT: 39.1 % (ref 36.0–46.0)
HEMOGLOBIN: 13.1 g/dL (ref 12.0–15.0)
MCH: 29.6 pg (ref 26.0–34.0)
MCHC: 33.5 g/dL (ref 30.0–36.0)
MCV: 88.3 fL (ref 78.0–100.0)
Platelets: 229 10*3/uL (ref 150–400)
RBC: 4.43 MIL/uL (ref 3.87–5.11)
RDW: 13.5 % (ref 11.5–15.5)
WBC: 7.9 10*3/uL (ref 4.0–10.5)

## 2017-08-31 LAB — I-STAT CG4 LACTIC ACID, ED: Lactic Acid, Venous: 0.76 mmol/L (ref 0.5–1.9)

## 2017-08-31 LAB — LIPASE, BLOOD: Lipase: 26 U/L (ref 11–51)

## 2017-08-31 MED ORDER — OXYCODONE HCL 5 MG PO TABS
5.0000 mg | ORAL_TABLET | ORAL | Status: DC | PRN
  Administered 2017-09-01 – 2017-09-07 (×13): 5 mg via ORAL
  Filled 2017-08-31 (×13): qty 1

## 2017-08-31 MED ORDER — ONDANSETRON 4 MG PO TBDP
4.0000 mg | ORAL_TABLET | Freq: Once | ORAL | Status: AC | PRN
  Administered 2017-08-31: 4 mg via ORAL
  Filled 2017-08-31: qty 1

## 2017-08-31 MED ORDER — ALBUTEROL SULFATE (2.5 MG/3ML) 0.083% IN NEBU
2.5000 mg | INHALATION_SOLUTION | Freq: Four times a day (QID) | RESPIRATORY_TRACT | Status: DC
  Filled 2017-08-31: qty 3

## 2017-08-31 MED ORDER — IOPAMIDOL (ISOVUE-370) INJECTION 76%
INTRAVENOUS | Status: AC
  Administered 2017-08-31: 100 mL
  Filled 2017-08-31: qty 100

## 2017-08-31 MED ORDER — ACETAMINOPHEN 650 MG RE SUPP: 650.0000 mg | Freq: Four times a day (QID) | RECTAL | Status: DC | PRN

## 2017-08-31 MED ORDER — SODIUM CHLORIDE 0.9% FLUSH
3.0000 mL | INTRAVENOUS | Status: DC | PRN
  Administered 2017-09-14: 3 mL via INTRAVENOUS
  Filled 2017-08-31: qty 3

## 2017-08-31 MED ORDER — ALBUTEROL SULFATE (2.5 MG/3ML) 0.083% IN NEBU: 2.5000 mg | INHALATION_SOLUTION | RESPIRATORY_TRACT | Status: DC | PRN

## 2017-08-31 MED ORDER — ONDANSETRON HCL 4 MG/2ML IJ SOLN
4.0000 mg | Freq: Four times a day (QID) | INTRAMUSCULAR | Status: DC | PRN
  Filled 2017-08-31 (×2): qty 2

## 2017-08-31 MED ORDER — IPRATROPIUM BROMIDE 0.02 % IN SOLN
0.5000 mg | Freq: Four times a day (QID) | RESPIRATORY_TRACT | Status: DC
  Filled 2017-08-31: qty 2.5

## 2017-08-31 MED ORDER — HYDROMORPHONE HCL 1 MG/ML IJ SOLN
0.5000 mg | Freq: Once | INTRAMUSCULAR | Status: AC
  Administered 2017-08-31: 0.5 mg via INTRAVENOUS
  Filled 2017-08-31: qty 1

## 2017-08-31 MED ORDER — SODIUM CHLORIDE 0.9 % IV SOLN: 250.0000 mL | INTRAVENOUS | Status: DC | PRN

## 2017-08-31 MED ORDER — MORPHINE SULFATE (PF) 4 MG/ML IV SOLN
2.0000 mg | INTRAVENOUS | Status: DC | PRN
  Administered 2017-09-02 – 2017-09-10 (×15): 2 mg via INTRAVENOUS
  Filled 2017-08-31 (×15): qty 1

## 2017-08-31 MED ORDER — ONDANSETRON 4 MG PO TBDP
4.0000 mg | ORAL_TABLET | Freq: Three times a day (TID) | ORAL | Status: DC | PRN
  Filled 2017-08-31: qty 1

## 2017-08-31 MED ORDER — ZOLPIDEM TARTRATE 5 MG PO TABS
5.0000 mg | ORAL_TABLET | Freq: Every evening | ORAL | Status: DC | PRN
Start: 2017-08-31 — End: 2017-09-08
  Administered 2017-09-07: 5 mg via ORAL
  Filled 2017-08-31: qty 1

## 2017-08-31 MED ORDER — ACETAMINOPHEN 500 MG PO TABS: 500.0000 mg | ORAL_TABLET | Freq: Four times a day (QID) | ORAL | Status: DC | PRN

## 2017-08-31 MED ORDER — ACETAMINOPHEN 325 MG PO TABS: 650.0000 mg | ORAL_TABLET | Freq: Four times a day (QID) | ORAL | Status: DC | PRN

## 2017-08-31 MED ORDER — ONDANSETRON HCL 4 MG PO TABS: 4.0000 mg | ORAL_TABLET | Freq: Four times a day (QID) | ORAL | Status: DC | PRN

## 2017-08-31 MED ORDER — SODIUM CHLORIDE 0.9% FLUSH
3.0000 mL | Freq: Two times a day (BID) | INTRAVENOUS | Status: DC
  Administered 2017-09-01 – 2017-09-12 (×19): 3 mL via INTRAVENOUS

## 2017-08-31 NOTE — ED Notes (Signed)
Samantha [name] family member phone number [phone] call if pt gets a bed.

## 2017-08-31 NOTE — ED Triage Notes (Signed)
Pt was sent here from MRI -- was supposed to have on e on right arm, -- pt is vomiting, has had chills since this am.

## 2017-08-31 NOTE — H&P (Signed)
[facility]                                                                                    Patient Demographics  Samantha [name], is a 72 y.o. female  CSN: [id number]  MRN: [id number]  DOB - Dec 15, 1945  Admit Date - 08/31/2017  Outpatient Primary MD for the patient is Patient, No Pcp Per   With History of -  Past Medical History:  Diagnosis Date  . Pneumonia 07/12/2013      Past Surgical History:  Procedure Laterality Date  . ABDOMINAL HYSTERECTOMY     20 years ago, due to fibroids     in for   Chief Complaint  Patient presents with  . Emesis     HPI  Samantha [name]  is a 71 y.o. 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  Patient could not make it to her MRI appointment due to the pain and weakness and had a CT of the chest and abdomen and the emergency room which showed right pleural effusion probably obscuring bronchogenic cancer which is the source of the right shoulder lytic lesion and other lesions noted in the spine and the pelvis.  I was called to admit for pain control and intractable nausea and vomiting.    Review of Systems    In addition to the HPI above,  No Fever  No Headache, No changes with Vision or hearing, No problems swallowing food or Liquids, No Chest pain,  No Abdominal pain, Bowel movements are regular, No Blood in stool or Urine, No dysuria, No new skin rashes or bruises,  No new weakness, tingling, numbness in any extremity, No recent weight gain or loss, No polyuria, polydypsia or polyphagia, No significant Mental Stressors.  A full 10 point Review of Systems was done, except as stated above, all other Review of Systems were negative.   Social History Social History   Tobacco Use  . Smoking status: Never Smoker  . Smokeless tobacco: Never Used  Substance Use Topics  . Alcohol use: No      Family History Family History  Problem Relation Age of Onset  . Stroke Mother        deceased  . Diabetes Mellitus II Mother        deceased  . Hypertension Mother   . Prostate cancer Father      Prior to Admission medications   Medication Sig Start Date End Date Taking? Authorizing Provider  acetaminophen (TYLENOL) 500 MG tablet Take 500 mg by mouth every 6 (six) hours as needed for moderate pain.   Yes [provider]  ondansetron (ZOFRAN ODT) 4 MG disintegrating tablet Take 1 tablet (4 mg total) by mouth every 8 (eight) hours as needed for nausea or vomiting. 08/07/17  Yes Gareth Morgan, MD  oxyCODONE-acetaminophen (PERCOCET/ROXICET) 5-325 MG tablet Take 1 tablet by mouth 2 (two) times daily as needed for moderate pain or severe pain.   Yes [provider]  Rivaroxaban 15 & 20 MG TBPK Take 1 tablet by mouth 2 (two)  times daily. Take as directed on package: Start with one 15mg  tablet by mouth twice a day with food. On Day 22, switch to one 20mg  tablet once a day with food.   Yes [provider]    No Known Allergies  Physical Exam  Vitals  Blood pressure (!) 151/77, pulse 81, temperature 98.2 F (36.8 C), temperature source Oral, resp. rate (!) 23, height 5\' 1"  (1.549 m), weight 78.5 kg (173 lb), SpO2 98 %.   1. General well-developed, extremely pleasant female  2. Normal affect and insight, Not Suicidal or Homicidal, Awake Alert, Oriented X 3.  3. No F.N deficits, grossly, patient moving all extremities.  4. Ears and Eyes appear Normal, Conjunctivae clear, PERRLA. Moist Oral Mucosa.  5. Supple Neck, No JVD, No cervical lymphadenopathy appriciated, No Carotid Bruits.  6. Symmetrical Chest wall movement, Good air movement bilaterally, CTAB.  7. RRR, No Gallops, Rubs or Murmurs, No Parasternal Heave.  8. Positive Bowel Sounds, Abdomen Soft, Non tender, No organomegaly appriciated,No rebound -guarding or rigidity.  9.  No Cyanosis,  Normal Skin Turgor, No Skin Rash or Bruise.  10. Good muscle tone,  joints appear normal , no effusions, Normal ROM.    Data Review  CBC Recent Labs  Lab 08/31/17 1446  WBC 7.9  HGB 13.1  HCT 39.1  PLT 229  MCV 88.3  MCH 29.6  MCHC 33.5  RDW 13.5   ------------------------------------------------------------------------------------------------------------------  Chemistries  Recent Labs  Lab 08/31/17 1446  NA 136  K 4.1  CL 103  CO2 20*  GLUCOSE 115*  BUN 12  CREATININE 0.79  CALCIUM 9.6  AST 20  ALT 14  ALKPHOS 107  BILITOT 0.6   ------------------------------------------------------------------------------------------------------------------ estimated creatinine clearance is 63 mL/min (by C-G formula based on SCr of 0.79 mg/dL). ------------------------------------------------------------------------------------------------------------------ No results for input(s): TSH, T4TOTAL, T3FREE, THYROIDAB in the last 72 hours.  Invalid input(s): FREET3   Coagulation profile No results for input(s): INR, PROTIME in the last 168 hours. ------------------------------------------------------------------------------------------------------------------- No results for input(s): DDIMER in the last 72 hours. -------------------------------------------------------------------------------------------------------------------  Cardiac Enzymes No results for input(s): CKMB, TROPONINI, MYOGLOBIN in the last 168 hours.  Invalid input(s): CK ------------------------------------------------------------------------------------------------------------------ Invalid input(s): POCBNP   ---------------------------------------------------------------------------------------------------------------  Urinalysis    Component Value Date/Time   COLORURINE YELLOW 08/07/2017 2134   APPEARANCEUR CLEAR 08/07/2017 2134   LABSPEC 1.027 08/07/2017 2134   PHURINE 5.0 08/07/2017 2134    GLUCOSEU NEGATIVE 08/07/2017 2134   HGBUR MODERATE (A) 08/07/2017 2134   BILIRUBINUR NEGATIVE 08/07/2017 2134   KETONESUR 5 (A) 08/07/2017 2134   PROTEINUR NEGATIVE 08/07/2017 2134   UROBILINOGEN 4.0 (H) 07/13/2013 0634   NITRITE NEGATIVE 08/07/2017 2134   LEUKOCYTESUR SMALL (A) 08/07/2017 2134    ----------------------------------------------------------------------------------------------------------------  Imaging results:   Ct Angio Chest Pe W And/or Wo Contrast  Result Date: 08/31/2017 CLINICAL DATA:  Vomiting, chills since this morning. Right-sided body pain. Pulmonary embolism suspected, high pretest probability. EXAM: CT ANGIOGRAPHY CHEST CT ABDOMEN AND PELVIS WITH CONTRAST TECHNIQUE: Multidetector CT imaging of the chest was performed using the standard protocol during bolus administration of intravenous contrast. Multiplanar CT image reconstructions and MIPs were obtained to evaluate the vascular anatomy. Multidetector CT imaging of the abdomen and pelvis was performed using the standard protocol during bolus administration of intravenous contrast. CONTRAST:  164mL ISOVUE-370 IOPAMIDOL (ISOVUE-370) INJECTION 76% COMPARISON:  None. FINDINGS: CTA CHEST FINDINGS Cardiovascular: Some of the most peripheral segmental and subsegmental pulmonary arteries are difficult to definitively characterize due to mild patient  breathing motion artifact, however, there is no pulmonary embolism identified within the main, lobar or central segmental pulmonary arteries bilaterally. Heart size is normal. No pericardial effusion. Thoracic aorta is normal in caliber and configuration. No aneurysm or evidence of dissection. Mediastinum/Nodes: Scattered small and mildly prominent lymph nodes within the mediastinum and right hilum. Esophagus appears normal. Trachea and central bronchi are unremarkable. Lungs/Pleura: Right pleural effusion, moderate to large in size, with adjacent compressive atelectasis. Associated  edema within the right upper lobe and right lower lobe. Left lung is clear. Musculoskeletal: Destructive changes of the proximal right humerus, incompletely imaged. Additional lytic foci within the T6 and T7 vertebral bodies, consistent with metastases. Probable additional lytic lesion within the medial aspects of the left tenth rib. Review of the MIP images confirms the above findings. CT ABDOMEN and PELVIS FINDINGS Hepatobiliary: Tiny hypodense foci within the left liver lobe, much too small to definitively characterize, most likely small cysts. Gallbladder appears normal. No bile duct dilatation. Pancreas: Unremarkable. No pancreatic ductal dilatation or surrounding inflammatory changes. Spleen: Normal in size without focal abnormality. Adrenals/Urinary Tract: Adrenal glands are unremarkable. Kidneys are normal, without renal calculi, focal lesion, or hydronephrosis. Bladder is unremarkable. Stomach/Bowel: Bowel is normal in caliber. No bowel wall thickening or evidence of bowel wall inflammation. Appendix is normal. Stomach is unremarkable, partially decompressed. Vascular/Lymphatic: No significant vascular findings are present. No enlarged abdominal or pelvic lymph nodes. Reproductive: Status post hysterectomy. No adnexal masses. Other: No free fluid or abscess collection. No free intraperitoneal air. No soft tissue mass seen. Musculoskeletal: Subtle small lucency within the right inferior pubic ramus, suspicious for additional metastasis. Similar lytic lesion within the upper sacrum, near the midline, again suspicious for metastasis. More convincing metastases are seen within the L4 vertebral body and right iliac bone. Review of the MIP images confirms the above findings. IMPRESSION: 1. Right pleural effusion, moderate to large in size, with adjacent compressive atelectasis. Associated pulmonary edema within the right upper lobe and right lower lobe. Left lung is clear. 2. Heterogeneous low-density areas  within the atelectasis at the right lung base, suspicious for neoplastic mass obscured by the surrounding atelectasis, a possible primary carcinoma for the osseous metastases described below. 3. No pulmonary embolism. 4. Destructive changes of the proximal right humerus, almost certainly neoplastic, metastatic versus primary carcinoma, incompletely imaged. Additional metastases identified within the thoracic spine, lumbar spine and osseous pelvis, as detailed above. 5. Mild lymphadenopathy within the mediastinum and right hilum, most likely reactive, but metastatic lymphadenopathy certainly not excluded given the osseous metastases described above. 6. No acute intra-abdominal or intrapelvic abnormality. No bowel obstruction or evidence of bowel wall inflammation. No evidence of acute solid organ abnormality. Appendix is normal. These results were called by telephone at the time of interpretation on 08/31/2017 at 8:14 pm to Dr. Blanchie Dessert , who verbally acknowledged these results. Electronically Signed   By: Franki Cabot M.D.   On: 08/31/2017 20:14   Ct Abdomen Pelvis W Contrast  Result Date: 08/31/2017 CLINICAL DATA:  Vomiting, chills since this morning. Right-sided body pain. Pulmonary embolism suspected, high pretest probability. EXAM: CT ANGIOGRAPHY CHEST CT ABDOMEN AND PELVIS WITH CONTRAST TECHNIQUE: Multidetector CT imaging of the chest was performed using the standard protocol during bolus administration of intravenous contrast. Multiplanar CT image reconstructions and MIPs were obtained to evaluate the vascular anatomy. Multidetector CT imaging of the abdomen and pelvis was performed using the standard protocol during bolus administration of intravenous contrast. CONTRAST:  1107mL ISOVUE-370 IOPAMIDOL (  ISOVUE-370) INJECTION 76% COMPARISON:  None. FINDINGS: CTA CHEST FINDINGS Cardiovascular: Some of the most peripheral segmental and subsegmental pulmonary arteries are difficult to definitively  characterize due to mild patient breathing motion artifact, however, there is no pulmonary embolism identified within the main, lobar or central segmental pulmonary arteries bilaterally. Heart size is normal. No pericardial effusion. Thoracic aorta is normal in caliber and configuration. No aneurysm or evidence of dissection. Mediastinum/Nodes: Scattered small and mildly prominent lymph nodes within the mediastinum and right hilum. Esophagus appears normal. Trachea and central bronchi are unremarkable. Lungs/Pleura: Right pleural effusion, moderate to large in size, with adjacent compressive atelectasis. Associated edema within the right upper lobe and right lower lobe. Left lung is clear. Musculoskeletal: Destructive changes of the proximal right humerus, incompletely imaged. Additional lytic foci within the T6 and T7 vertebral bodies, consistent with metastases. Probable additional lytic lesion within the medial aspects of the left tenth rib. Review of the MIP images confirms the above findings. CT ABDOMEN and PELVIS FINDINGS Hepatobiliary: Tiny hypodense foci within the left liver lobe, much too small to definitively characterize, most likely small cysts. Gallbladder appears normal. No bile duct dilatation. Pancreas: Unremarkable. No pancreatic ductal dilatation or surrounding inflammatory changes. Spleen: Normal in size without focal abnormality. Adrenals/Urinary Tract: Adrenal glands are unremarkable. Kidneys are normal, without renal calculi, focal lesion, or hydronephrosis. Bladder is unremarkable. Stomach/Bowel: Bowel is normal in caliber. No bowel wall thickening or evidence of bowel wall inflammation. Appendix is normal. Stomach is unremarkable, partially decompressed. Vascular/Lymphatic: No significant vascular findings are present. No enlarged abdominal or pelvic lymph nodes. Reproductive: Status post hysterectomy. No adnexal masses. Other: No free fluid or abscess collection. No free intraperitoneal  air. No soft tissue mass seen. Musculoskeletal: Subtle small lucency within the right inferior pubic ramus, suspicious for additional metastasis. Similar lytic lesion within the upper sacrum, near the midline, again suspicious for metastasis. More convincing metastases are seen within the L4 vertebral body and right iliac bone. Review of the MIP images confirms the above findings. IMPRESSION: 1. Right pleural effusion, moderate to large in size, with adjacent compressive atelectasis. Associated pulmonary edema within the right upper lobe and right lower lobe. Left lung is clear. 2. Heterogeneous low-density areas within the atelectasis at the right lung base, suspicious for neoplastic mass obscured by the surrounding atelectasis, a possible primary carcinoma for the osseous metastases described below. 3. No pulmonary embolism. 4. Destructive changes of the proximal right humerus, almost certainly neoplastic, metastatic versus primary carcinoma, incompletely imaged. Additional metastases identified within the thoracic spine, lumbar spine and osseous pelvis, as detailed above. 5. Mild lymphadenopathy within the mediastinum and right hilum, most likely reactive, but metastatic lymphadenopathy certainly not excluded given the osseous metastases described above. 6. No acute intra-abdominal or intrapelvic abnormality. No bowel obstruction or evidence of bowel wall inflammation. No evidence of acute solid organ abnormality. Appendix is normal. These results were called by telephone at the time of interpretation on 08/31/2017 at 8:14 pm to Dr. Blanchie Dessert , who verbally acknowledged these results. Electronically Signed   By: Franki Cabot M.D.   On: 08/31/2017 20:14      Assessment & Plan  1.  Right pleural effusion, with right lung lesion 2.  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  Intractable nausea and vomiting 4.  Uncontrolled pain 5.   Right arm DVT on [place] for control of pain and nausea Schedule patient  for thoracentesis of right lung for diagnosis and relief purposes Hold Xarelto for now N.p.o. after midnight  DVT Prophylaxis Xarelto  AM Labs Ordered, also please review Full Orders  Family Communication: Admission, patients condition and plan of care including tests being ordered have been discussed with the patient and cousin who indicate understanding and agree with the plan and Code Status.  Code Status full  Disposition Plan: Home  Time spent in minutes : 43 minutes  Condition GUARDED   @SIGNATURE @

## 2017-08-31 NOTE — ED Notes (Signed)
ED Provider at bedside. 

## 2017-08-31 NOTE — ED Provider Notes (Signed)
[facility] EMERGENCY DEPARTMENT Provider Note   CSN: [id number] Arrival date & time: 08/31/17  1432     History   Chief Complaint Chief Complaint  Patient presents with  . Emesis    HPI Samantha [name] is a 72 y.o. female.  Patient presents after attempting to get MRI today of her right arm.  She has a history of arm pain and is being worked up for a lytic lesion.  No primary source of cancer has been identified.  The patient was also recently started on Xarelto for a blood clot to the right arm.  The pain in her arm became more intense today and the patient has generalized weakness.  She has also had nausea without emesis for approximately 1 month.  She denies chest pain, shortness of breath, abdominal pain, dysuria, constipation or diarrhea.  She states she is attempted to control her pain at home with Percocet but has had no improvement.      Past Medical History:  Diagnosis Date  . Pneumonia 07/12/2013    Patient Active Problem List   Diagnosis Date Noted  . Community acquired pneumonia 07/12/2013  . Normocytic anemia 07/12/2013    Past Surgical History:  Procedure Laterality Date  . ABDOMINAL HYSTERECTOMY     20 years ago, due to fibroids     OB History    No data available       Home Medications    Prior to Admission medications   Medication Sig Start Date End Date Taking? Authorizing Provider  acetaminophen (TYLENOL) 500 MG tablet Take 500 mg by mouth every 6 (six) hours as needed for moderate pain.    [provider]  cefUROXime (CEFTIN) 500 MG tablet Take 1 tablet (500 mg total) by mouth 2 (two) times daily with a meal. Patient not taking: Reported on 08/07/2017 07/14/13   Jessee Avers, MD  meclizine (ANTIVERT) 25 MG tablet Take 1 tablet (25 mg total) by mouth 3 (three) times daily as needed for dizziness. Patient not taking: Reported on 08/07/2017 05/18/16   Orlie Dakin, MD  ondansetron (ZOFRAN ODT) 4 MG  disintegrating tablet Take 1 tablet (4 mg total) by mouth every 8 (eight) hours as needed for nausea or vomiting. 08/07/17   Gareth Morgan, MD  traMADol (ULTRAM) 50 MG tablet Take 1 tablet (50 mg total) by mouth every 6 (six) hours as needed. Patient not taking: Reported on 08/07/2017 07/29/17   Duanne Guess, PA-C    Family History Family History  Problem Relation Age of Onset  . Stroke Mother        deceased  . Diabetes Mellitus II Mother        deceased  . Hypertension Mother   . Prostate cancer Father     Social History Social History   Tobacco Use  . Smoking status: Never Smoker  . Smokeless tobacco: Never Used  Substance Use Topics  . Alcohol use: No  . Drug use: No     Allergies   Patient has no known allergies.   Review of Systems Review of Systems  Constitutional: Negative for chills and fever.  HENT: Negative for ear pain and sore throat.   Eyes: Negative for pain and visual disturbance.  Respiratory: Negative for cough and shortness of breath.   Cardiovascular: Negative for chest pain and palpitations.  Gastrointestinal: Positive for nausea. Negative for abdominal pain and vomiting.  Genitourinary: Negative for dysuria and hematuria.  Musculoskeletal: Negative for arthralgias and back  pain.  Skin: Negative for color change and rash.  Neurological: Negative for seizures and syncope.  All other systems reviewed and are negative.    Physical Exam Updated Vital Signs BP (!) 158/81 (BP Location: Right Arm)   Pulse 91   Temp 98.2 F (36.8 C) (Oral)   Resp 18   SpO2 100%   Physical Exam  Constitutional: She is oriented to person, place, and time. She appears well-developed and well-nourished.  HENT:  Head: Normocephalic and atraumatic.  Eyes: Conjunctivae and EOM are normal. Pupils are equal, round, and reactive to light.  Neck: Neck supple.  Cardiovascular: Normal rate and regular rhythm.  Pulmonary/Chest: Effort normal and breath sounds normal.    Abdominal: Soft. There is no tenderness.  Musculoskeletal: She exhibits tenderness (to rue from elbow to shoulder). She exhibits no edema or deformity.  Neurological: She is alert and oriented to person, place, and time.  Skin: Skin is warm and dry.  Psychiatric: She has a normal mood and affect.  Nursing note and vitals reviewed.    ED Treatments / Results  Labs (all labs ordered are listed, but only abnormal results are displayed) Labs Reviewed  COMPREHENSIVE METABOLIC PANEL - Abnormal; Notable for the following components:      Result Value   CO2 20 (*)    Glucose, Bld 115 (*)    All other components within normal limits  LIPASE, BLOOD  CBC  URINALYSIS, ROUTINE W REFLEX MICROSCOPIC    EKG  EKG Interpretation None       Radiology No results found.  Procedures Procedures (including critical care time)  Medications Ordered in ED Medications  ondansetron (ZOFRAN-ODT) disintegrating tablet 4 mg (4 mg Oral Given 08/31/17 1453)     Initial Impression / Assessment and Plan / ED Course  I have reviewed the triage vital signs and the nursing notes.  Pertinent labs & imaging results that were available during my care of the patient were reviewed by me and considered in my medical decision making (see chart for details).     Samantha [name] is a 72 year old female with past medical history significant for right arm lytic lesion who presents for uncontrolled pain.  Patient's home pain medication, Percocet, has been insufficient in managing her pain.  The patient is currently being worked up for the arm lesion.  She was at MRI today and was unable to proceed due to pain.  Of note she also has recently been started on Xarelto for blood clot to her arm.  Labs are significant for mild metabolic acidosis, no leukocytosis.  IV Dilaudid provided with excellent pain improvement.  CT chest/abdomen/pelvis obtained and demonstrates right pleural effusion, atelectasis at the right  lung base suspicious for neoplasm, no PE, lytic lesions to the right humerus, thoracic spine, lumbar spine, and osseous pelvis.  Patient is admitted for further pain control and new neoplasm workup.  Final Clinical Impressions(s) / ED Diagnoses   Final diagnoses:  Lytic bone lesions on xray  Pleural effusion on right  Pleural effusion    ED Discharge Orders    None       Elveria Rising, MD 09-17-17 [id number]    Blanchie Dessert, MD 2017-09-17 1303

## 2017-09-01 ENCOUNTER — Inpatient Hospital Stay (HOSPITAL_COMMUNITY): Payer: Medicare HMO

## 2017-09-01 ENCOUNTER — Encounter (HOSPITAL_COMMUNITY): Payer: Self-pay | Admitting: General Surgery

## 2017-09-01 ENCOUNTER — Telehealth: Payer: Self-pay | Admitting: Hematology and Oncology

## 2017-09-01 ENCOUNTER — Encounter: Payer: Self-pay | Admitting: *Deleted

## 2017-09-01 DIAGNOSIS — M899 Disorder of bone, unspecified: Secondary | ICD-10-CM

## 2017-09-01 DIAGNOSIS — J90 Pleural effusion, not elsewhere classified: Secondary | ICD-10-CM

## 2017-09-01 DIAGNOSIS — R918 Other nonspecific abnormal finding of lung field: Secondary | ICD-10-CM

## 2017-09-01 DIAGNOSIS — R0902 Hypoxemia: Secondary | ICD-10-CM

## 2017-09-01 HISTORY — PX: IR THORACENTESIS ASP PLEURAL SPACE W/IMG GUIDE: IMG5380

## 2017-09-01 LAB — CBC
HCT: 37.5 % (ref 36.0–46.0)
Hemoglobin: 12.3 g/dL (ref 12.0–15.0)
MCH: 29 pg (ref 26.0–34.0)
MCHC: 32.8 g/dL (ref 30.0–36.0)
MCV: 88.4 fL (ref 78.0–100.0)
PLATELETS: 223 10*3/uL (ref 150–400)
RBC: 4.24 MIL/uL (ref 3.87–5.11)
RDW: 13.8 % (ref 11.5–15.5)
WBC: 6.3 10*3/uL (ref 4.0–10.5)

## 2017-09-01 LAB — BODY FLUID CELL COUNT WITH DIFFERENTIAL
EOS FL: 1 %
Lymphs, Fluid: 82 %
MONOCYTE-MACROPHAGE-SEROUS FLUID: 6 % — AB (ref 50–90)
Neutrophil Count, Fluid: 11 % (ref 0–25)
Total Nucleated Cell Count, Fluid: 605 cu mm (ref 0–1000)

## 2017-09-01 LAB — GRAM STAIN

## 2017-09-01 LAB — AMYLASE: Amylase: 351 U/L — ABNORMAL HIGH (ref 28–100)

## 2017-09-01 LAB — AMYLASE, PLEURAL OR PERITONEAL FLUID: Amylase, Fluid: 3035 U/L

## 2017-09-01 LAB — PROTEIN, PLEURAL OR PERITONEAL FLUID: TOTAL PROTEIN, FLUID: 4.9 g/dL

## 2017-09-01 LAB — GLUCOSE, PLEURAL OR PERITONEAL FLUID: Glucose, Fluid: 91 mg/dL

## 2017-09-01 LAB — HEPARIN LEVEL (UNFRACTIONATED): Heparin Unfractionated: 0.41 IU/mL (ref 0.30–0.70)

## 2017-09-01 LAB — APTT: APTT: 57 s — AB (ref 24–36)

## 2017-09-01 MED ORDER — LIDOCAINE HCL (PF) 2 % IJ SOLN
INTRAMUSCULAR | Status: DC | PRN
  Administered 2017-09-01: 10 mL

## 2017-09-01 MED ORDER — SENNA 8.6 MG PO TABS
1.0000 | ORAL_TABLET | Freq: Every day | ORAL | Status: DC
  Administered 2017-09-01 – 2017-09-08 (×7): 8.6 mg via ORAL
  Filled 2017-09-01 (×7): qty 1

## 2017-09-01 MED ORDER — HEPARIN BOLUS VIA INFUSION
3000.0000 [IU] | Freq: Once | INTRAVENOUS | Status: DC
  Filled 2017-09-01: qty 3000

## 2017-09-01 MED ORDER — ENSURE ENLIVE PO LIQD
237.0000 mL | Freq: Two times a day (BID) | ORAL | Status: DC
  Administered 2017-09-02 – 2017-09-13 (×14): 237 mL via ORAL

## 2017-09-01 MED ORDER — LIDOCAINE 2% (20 MG/ML) 5 ML SYRINGE
INTRAMUSCULAR | Status: AC
  Filled 2017-09-01: qty 10

## 2017-09-01 MED ORDER — HEPARIN (PORCINE) IN NACL 100-0.45 UNIT/ML-% IJ SOLN
750.0000 [IU]/h | INTRAMUSCULAR | Status: DC
  Administered 2017-09-01 – 2017-09-02 (×2): 1100 [IU]/h via INTRAVENOUS
  Filled 2017-09-01 (×2): qty 250

## 2017-09-01 MED ORDER — POLYETHYLENE GLYCOL 3350 17 G PO PACK
17.0000 g | PACK | Freq: Two times a day (BID) | ORAL | Status: DC
  Administered 2017-09-01 – 2017-09-13 (×20): 17 g via ORAL
  Filled 2017-09-01 (×18): qty 1

## 2017-09-02 ENCOUNTER — Inpatient Hospital Stay (HOSPITAL_COMMUNITY): Payer: Medicare HMO

## 2017-09-02 DIAGNOSIS — C779 Secondary and unspecified malignant neoplasm of lymph node, unspecified: Secondary | ICD-10-CM

## 2017-09-02 DIAGNOSIS — M8580 Other specified disorders of bone density and structure, unspecified site: Secondary | ICD-10-CM

## 2017-09-02 DIAGNOSIS — C7949 Secondary malignant neoplasm of other parts of nervous system: Secondary | ICD-10-CM

## 2017-09-02 DIAGNOSIS — Z8042 Family history of malignant neoplasm of prostate: Secondary | ICD-10-CM

## 2017-09-02 DIAGNOSIS — C3431 Malignant neoplasm of lower lobe, right bronchus or lung: Secondary | ICD-10-CM

## 2017-09-02 DIAGNOSIS — Z79899 Other long term (current) drug therapy: Secondary | ICD-10-CM

## 2017-09-02 DIAGNOSIS — C7931 Secondary malignant neoplasm of brain: Secondary | ICD-10-CM

## 2017-09-02 DIAGNOSIS — Z90710 Acquired absence of both cervix and uterus: Secondary | ICD-10-CM

## 2017-09-02 DIAGNOSIS — C7951 Secondary malignant neoplasm of bone: Secondary | ICD-10-CM

## 2017-09-02 DIAGNOSIS — I82409 Acute embolism and thrombosis of unspecified deep veins of unspecified lower extremity: Secondary | ICD-10-CM

## 2017-09-02 LAB — APTT
APTT: 122 s — AB (ref 24–36)
aPTT: 114 seconds — ABNORMAL HIGH (ref 24–36)

## 2017-09-02 LAB — CBC
HCT: 37.9 % (ref 36.0–46.0)
Hemoglobin: 12.2 g/dL (ref 12.0–15.0)
MCH: 28.8 pg (ref 26.0–34.0)
MCHC: 32.2 g/dL (ref 30.0–36.0)
MCV: 89.6 fL (ref 78.0–100.0)
Platelets: 207 10*3/uL (ref 150–400)
RBC: 4.23 MIL/uL (ref 3.87–5.11)
RDW: 14 % (ref 11.5–15.5)
WBC: 5.6 10*3/uL (ref 4.0–10.5)

## 2017-09-02 LAB — HEPARIN LEVEL (UNFRACTIONATED)
HEPARIN UNFRACTIONATED: 0.95 [IU]/mL — AB (ref 0.30–0.70)
Heparin Unfractionated: 1.12 IU/mL — ABNORMAL HIGH (ref 0.30–0.70)

## 2017-09-02 LAB — BASIC METABOLIC PANEL
Anion gap: 12 (ref 5–15)
BUN: 14 mg/dL (ref 6–20)
CHLORIDE: 103 mmol/L (ref 101–111)
CO2: 21 mmol/L — AB (ref 22–32)
CREATININE: 0.8 mg/dL (ref 0.44–1.00)
Calcium: 9.2 mg/dL (ref 8.9–10.3)
GFR calc Af Amer: >60 mL/min (ref >60)
GFR calc non Af Amer: >60 mL/min (ref >60)
Glucose, Bld: 111 mg/dL — ABNORMAL HIGH (ref 65–99)
Potassium: 4.6 mmol/L (ref 3.5–5.1)
Sodium: 136 mmol/L (ref 135–145)

## 2017-09-02 LAB — ACID FAST SMEAR (AFB)

## 2017-09-02 LAB — ACID FAST SMEAR (AFB, MYCOBACTERIA): Acid Fast Smear: NEGATIVE

## 2017-09-02 MED ORDER — GADOBENATE DIMEGLUMINE 529 MG/ML IV SOLN
15.0000 mL | Freq: Once | INTRAVENOUS | Status: AC
  Administered 2017-09-02: 15 mL via INTRAVENOUS

## 2017-09-02 MED ORDER — DEXAMETHASONE SODIUM PHOSPHATE 4 MG/ML IJ SOLN
8.0000 mg | Freq: Three times a day (TID) | INTRAMUSCULAR | Status: DC
  Administered 2017-09-02 – 2017-09-11 (×26): 8 mg via INTRAVENOUS
  Filled 2017-09-02 (×27): qty 2

## 2017-09-02 NOTE — Consult Note (Signed)
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 Female currently admitted to the hospital after presenting to the emergency room on 08/31/17 with increasing pain in the right arm, nausea, and vomiting.  Prior to the presentation, patient was found to have a lytic lesion in the right humerus as well as a deep vein thrombosis in the lower extremities, currently receiving rivaroxaban for therapy.  On presentation to the emergency room, CT of the chest/abdomen/pelvis was obtained demonstrating multifocal skeletal lesions including the right humerus, several levels of the spine.  In addition, a large pulmonary mass with associated pleural effusion was discovered on the right side. Patient was admitted for pain and nausea control.  Patient underwent diagnostic and therapeutic thoracentesis on the right yesterday.  Results are still pending.   Since admission, patient underwent an additional evaluations including MRI of the brain and cervical spine.  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  There was no midline shift and no parenchymal lesions.  At the present time, patient reports mild headache, but no visual, sensory, or strength deficits.  She denies any involuntary movements in the face, or extremities.  Pain is better controlled overall, although the discomfort in the right shoulder remains severe.  Patient denies any nausea or abdominal pain at this time.  Oncological History: **Metastatic Malignancy, NOS: --Xray Rt Shoulder, 07/29/17: There is no evidence of acute fracture or dislocation. Generalized osteopenia is noted. A lytic bone lesion is  seen involving the proximal humeral metadiaphysis, suspicious for bone metastasis. --CT C/A/P, 08/31/17: No pulmonary embolism identified within the main, lobar or central segmental pulmonary arteries bilaterally. Heart size is normal. No pericardial effusion. Thoracic aorta is normal in caliber and configuration. No aneurysm or evidence of dissection. Mediastinum/Nodes: Scattered small and mildly prominent lymph nodes within the mediastinum and right hilum. Esophagus appears normal. Trachea and central bronchi are unremarkable. Lungs/Pleura: Right pleural effusion, moderate to large in size, with adjacent compressive atelectasis. Associated edema within the right upper lobe and right lower lobe. Left lung is clear. Musculoskeletal: Destructive changes of the proximal right humerus, incompletely imaged. Additional lytic foci within the T6 and T7 vertebral bodies, consistent with metastases. Probable additional lytic lesion within the medial aspects of the left tenth rib. Hepatobiliary: Tiny hypodense foci within the left liver lobe, much too small to definitively characterize, most likely small cysts. Gallbladder appears normal. No bile duct dilatation.  Pancreas: Unremarkable. No pancreatic ductal dilatation or surrounding inflammatory changes. Spleen: Normal in size without focal abnormality. Adrenals/Urinary Tract: Adrenal glands are unremarkable. Kidneys are normal, without renal calculi, focal lesion, or hydronephrosis. Bladder is unremarkable. Stomach/Bowel: Bowel is normal in caliber. No bowel wall thickening or evidence of bowel wall inflammation. Appendix is normal. Stomach is unremarkable, partially decompressed. Vascular/Lymphatic: No significant vascular findings are present. No enlarged abdominal or pelvic lymph nodes. Reproductive: Status post hysterectomy. No adnexal masses.  Other: No free fluid or abscess collection. No free intraperitoneal air. No soft tissue mass  seen. Musculoskeletal: Subtle small lucency within the right inferior pubic ramus, suspicious for additional metastasis. Similar lytic lesion within the upper sacrum, near the midline, again suspicious for metastasis. More convincing metastases are seen within the L4  vertebral body and right iliac bone. --CT Chest wo C (post-thoracentesis), 09/27/2017: Mediastinum/Nodes: Mediastinal and right hilar lymphadenopathy. Anterior mediastinal node measures 7 mm. 10 mm pretracheal lymph node. 9 mm aorticopulmonary window node. 13 mm subcarinal lymph node on the right side. The esophagus is grossly normal. Lungs/Pleura: Status post right-sided thoracentesis with a small residual pleural effusion. There is a 6 x 5 cm masslike lesion in the right upper lobe worrisome for neoplasm given the adenopathy and the destructive metastatic bone disease. 4 mm nodule noted at the left lung base. Do not see any other obvious pulmonary nodules to suggest pulmonary metastatic disease. --MRI Brain/C-spine, 09/02/17: Brain: No restricted diffusion, or hemorrhage.  No intra-axial mass. Communicating hydrocephalus, with mild enlargement of the lateral, third, and fourth ventricles. Abnormal FLAIR signal in the CSF over the convexity, RIGHT greater than LEFT, is accompanied by abnormal enhancement in the subarachnoid space, consistent with leptomeningeal spread of tumor. Similar findings in the posterior fossa are less impressive. There is mild superimposed atrophy. There is mild chronic microvascular ischemic change in addition to transependymal absorption. No midline shift. No extra-axial fluid. No definite discrete parenchymal metastases although post infusion imaging is mildly motion degraded. C-spine without worrisome osseous lesions. Cord: Normal signal and morphology. No abnormal enhancement of the cord or surrounding meninges. Posterior Fossa, vertebral arteries, paraspinal tissues: Vertebral arteries are patent. LEFT greater than  RIGHT thyroid gland enlargement, likely goiter. Consider CT neck with contrast for further characterization. Abnormal enhancement in the posterior fossa, particularly surrounding the LEFT cerebellar tonsil. No disc protrusion or spinal stenosis.      Past Medical History:  Diagnosis Date  . Pneumonia 07/12/2013  :  Past Surgical History:  Procedure Laterality Date  . ABDOMINAL HYSTERECTOMY     20 years ago, due to fibroids   . IR THORACENTESIS ASP PLEURAL SPACE W/IMG GUIDE  09/27/17  :   Current Facility-Administered Medications:  .  0.9 %  sodium chloride infusion, 250 mL, Intravenous, PRN, Merton Border, MD .  acetaminophen (TYLENOL) tablet 650 mg, 650 mg, Oral, Q6H PRN **OR** acetaminophen (TYLENOL) suppository 650 mg, 650 mg, Rectal, Q6H PRN, Merton Border, MD .  albuterol (PROVENTIL) (2.5 MG/3ML) 0.083% nebulizer solution 2.5 mg, 2.5 mg, Nebulization, Q2H PRN, Merton Border, MD .  dexamethasone (DECADRON) injection 8 mg, 8 mg, Intravenous, Q8H, Perlov, Marinell Blight, MD, 8 mg at 09/02/17 1634 .  feeding supplement (ENSURE ENLIVE) (ENSURE ENLIVE) liquid 237 mL, 237 mL, Oral, BID BM, Regalado, Belkys A, MD, 237 mL at 09/02/17 1407 .  heparin ADULT infusion 100 units/mL (25000 units/258mL sodium chloride 0.45%), 900 Units/hr, Intravenous, Continuous, Regalado, Belkys A, MD, Last Rate: 9 mL/hr at 09/02/17 1610, 900 Units/hr at 09/02/17 1610 .  lidocaine (XYLOCAINE) 2 % injection, , Infiltration, PRN, Saverio Danker, PA-C, 10 mL at September 27, 2017 0905 .  morphine 4 MG/ML injection 2 mg, 2 mg, Intravenous, Q4H PRN, Merton Border, MD .  ondansetron (ZOFRAN) tablet 4 mg, 4 mg, Oral, Q6H PRN **OR** ondansetron (ZOFRAN) injection 4 mg, 4 mg, Intravenous, Q6H PRN, Merton Border, MD .  oxyCODONE (Oxy IR/ROXICODONE) immediate release tablet 5 mg, 5 mg, Oral, Q4H PRN, Merton Border, MD, 5 mg at 09/02/17 1407 .  polyethylene glycol (MIRALAX / GLYCOLAX) packet 17 g, 17 g, Oral, BID, Regalado, Belkys A, MD, 17 g at  09/02/17 0846 .  senna (SENOKOT) tablet 8.6 mg, 1 tablet, Oral, Daily, Regalado, Belkys A, MD, 8.6 mg at 09/02/17 0845 .  sodium chloride flush (NS) 0.9 %  injection 3 mL, 3 mL, Intravenous, Q12H, Merton Border, MD, 3 mL at 09/02/17 0848 .  sodium chloride flush (NS) 0.9 % injection 3 mL, 3 mL, Intravenous, PRN, Merton Border, MD .  zolpidem (AMBIEN) tablet 5 mg, 5 mg, Oral, QHS PRN, Merton Border, MD:  . dexamethasone  8 mg Intravenous Q8H  . feeding supplement (ENSURE ENLIVE)  237 mL Oral BID BM  . polyethylene glycol  17 g Oral BID  . senna  1 tablet Oral Daily  . sodium chloride flush  3 mL Intravenous Q12H  :  No Known Allergies:   Family History  Problem Relation Age of Onset  . Stroke Mother        deceased  . Diabetes Mellitus II Mother        deceased  . Hypertension Mother   . Prostate cancer Father    Social History   Socioeconomic History  . Marital status: Married    Spouse name: Not on file  . Number of children: 3  . Years of education: Not on file  . Highest education level: Not on file  Social Needs  . Financial resource strain: Not on file  . Food insecurity - worry: Not on file  . Food insecurity - inability: Not on file  . Transportation needs - medical: Not on file  . Transportation needs - non-medical: Not on file  Occupational History  . Occupation: Event Coordinator    Comment: [employer]  Tobacco Use  . Smoking status: Never Smoker  . Smokeless tobacco: Never Used  Substance and Sexual Activity  . Alcohol use: No  . Drug use: No  . Sexual activity: Not on file  Other Topics Concern  . Not on file  Social History Narrative   Has a biological son and daughter, also has an adopted son.  She lives with her husband. No pets.     Review of Systems: As per HPI.  All other systems are negative  Physical Exam:  Blood pressure 132/70, pulse 85, temperature 98.4 F (36.9 C), temperature source Oral, resp. rate 17, height 5\' 1"  (1.549 m),  weight 173 lb (78.5 kg), SpO2 100 %.  Patient is alert, awake, oriented x3 appears to be somewhat uncomfortable with reduced movement in bed apparently due to pain in the right shoulder HEENT: Anicteric sclerae, moist mucous membranes. Lungs: Clear to auscultation in the upper portions of both lungs.  Reduced breath sounds in the right lower lung fields without expiratory wheezing or significant dullness to percussion Cardiac: S1/S2, regular, no murmurs, rubs, gallops. Abdomen: Soft, nontender nondistended.  Positive bowel sounds no hepatosplenomegaly. Vascular: No palpable vascular cords in the lower extremities bilaterally Lymph nodes: No palpable lymphadenopathy in the cervical, supraclavicular, or inguinal regions Neurologic: No gross focal neurological deficits Skin: No skin rash, petechiae, or ecchymosis Musculoskeletal: Unremarkable other than significant tenderness to palpation around the upper portion of the right humerus and clavicle laterally  LABS:  Recent Labs    15-Sep-2017 0551 09/02/17 0432  WBC 6.3 5.6  HGB 12.3 12.2  HCT 37.5 37.9  PLT 223 207    Recent Labs    08/31/17 1446 09/02/17 0432  NA 136 136  K 4.1 4.6  CL 103 103  CO2 20* 21*  GLUCOSE 115* 111*  BUN 12 14  CREATININE 0.79 0.80  CALCIUM 9.6 9.2      RADIOLOGY:  Dg Chest 1 View  Result Date: 2017/09/15 CLINICAL DATA:  Thoracentesis. EXAM: CHEST 1 VIEW  COMPARISON:  CT 08/31/2016. FINDINGS: Mediastinum and hilar structures are normal. Heart size normal. Atelectatic changes right lung base. Small right pleural effusion. No pneumothorax destructive right humeral lesion best identified on CT. IMPRESSION: 1. Persistent atelectasis/mass right lung base. Reference is made to prior CT report 08/31/2016. 2.  Destructive right humeral lesion best identified on prior CT. Electronically Signed   By: Marcello Moores  Register   On: 2017/09/11 09:39   Ct Chest Wo Contrast  Result Date: 2017-09-11 CLINICAL DATA:  Status  post thoracentesis. Evaluate right lower lobe process. EXAM: CT CHEST WITHOUT CONTRAST TECHNIQUE: Multidetector CT imaging of the chest was performed following the standard protocol without IV contrast. COMPARISON:  CT chest, abdomen and pelvis 08/31/2017 FINDINGS: Cardiovascular: The heart is normal in size. No pericardial effusion. The aorta is normal in caliber. No atherosclerotic calcifications. No significant coronary artery calcifications. Mediastinum/Nodes: Mediastinal and right hilar lymphadenopathy. Anterior mediastinal node on image number 40 measures 7 mm. 10 mm pretracheal lymph node on image number 43. 9 mm aorticopulmonary window node on image number 43. 13 mm subcarinal lymph node on the right side on image number 57. The esophagus is grossly normal. Lungs/Pleura: Status post right-sided thoracentesis with a small residual pleural effusion. There is a 6 x 5 cm masslike lesion in the right upper lobe worrisome for neoplasm given the adenopathy and the destructive metastatic bone disease. 4 mm nodule noted at the left lung base on image number 86. Do not see any other obvious pulmonary nodules to suggest pulmonary metastatic disease. Upper Abdomen: No obvious hepatic or adrenal gland metastasis. Contrast in the gallbladder related to vicarious excretion. Musculoskeletal: No obvious breast masses, supraclavicular or axillary adenopathy. Enlarged multinodular thyroid goiter is noted. Again demonstrated is lytic metastatic disease involving the thoracic vertebral bodies, a few ribs and possibly the sternum. T7 is the most affected but no definite canal encroachment. MRI thoracic spine may be helpful for further evaluation. IMPRESSION: 1. Status post right thoracentesis. 6 x 5 cm masslike lesion in the right lower lobe worrisome for primary lung neoplasm given the mediastinal and right hilar adenopathy and diffuse osseous metastatic disease. Recommend correlation with recent right pleural fluid. PET-CT may  be helpful to assess for safest biopsy route. 2. 4 mm left lower lobe pulmonary nodules but no other definite pulmonary lesions. 3. No definite upper abdominal metastatic disease. 4. Multinodular thyroid goiter. 5. No obvious breast masses. Electronically Signed   By: Marijo Sanes M.D.   On: 2017/09/11 16:27   Ct Angio Chest Pe W And/or Wo Contrast  Result Date: 08/31/2017 CLINICAL DATA:  Vomiting, chills since this morning. Right-sided body pain. Pulmonary embolism suspected, high pretest probability. EXAM: CT ANGIOGRAPHY CHEST CT ABDOMEN AND PELVIS WITH CONTRAST TECHNIQUE: Multidetector CT imaging of the chest was performed using the standard protocol during bolus administration of intravenous contrast. Multiplanar CT image reconstructions and MIPs were obtained to evaluate the vascular anatomy. Multidetector CT imaging of the abdomen and pelvis was performed using the standard protocol during bolus administration of intravenous contrast. CONTRAST:  157mL ISOVUE-370 IOPAMIDOL (ISOVUE-370) INJECTION 76% COMPARISON:  None. FINDINGS: CTA CHEST FINDINGS Cardiovascular: Some of the most peripheral segmental and subsegmental pulmonary arteries are difficult to definitively characterize due to mild patient breathing motion artifact, however, there is no pulmonary embolism identified within the main, lobar or central segmental pulmonary arteries bilaterally. Heart size is normal. No pericardial effusion. Thoracic aorta is normal in caliber and configuration. No aneurysm or evidence of dissection. Mediastinum/Nodes: Scattered small  and mildly prominent lymph nodes within the mediastinum and right hilum. Esophagus appears normal. Trachea and central bronchi are unremarkable. Lungs/Pleura: Right pleural effusion, moderate to large in size, with adjacent compressive atelectasis. Associated edema within the right upper lobe and right lower lobe. Left lung is clear. Musculoskeletal: Destructive changes of the proximal  right humerus, incompletely imaged. Additional lytic foci within the T6 and T7 vertebral bodies, consistent with metastases. Probable additional lytic lesion within the medial aspects of the left tenth rib. Review of the MIP images confirms the above findings. CT ABDOMEN and PELVIS FINDINGS Hepatobiliary: Tiny hypodense foci within the left liver lobe, much too small to definitively characterize, most likely small cysts. Gallbladder appears normal. No bile duct dilatation. Pancreas: Unremarkable. No pancreatic ductal dilatation or surrounding inflammatory changes. Spleen: Normal in size without focal abnormality. Adrenals/Urinary Tract: Adrenal glands are unremarkable. Kidneys are normal, without renal calculi, focal lesion, or hydronephrosis. Bladder is unremarkable. Stomach/Bowel: Bowel is normal in caliber. No bowel wall thickening or evidence of bowel wall inflammation. Appendix is normal. Stomach is unremarkable, partially decompressed. Vascular/Lymphatic: No significant vascular findings are present. No enlarged abdominal or pelvic lymph nodes. Reproductive: Status post hysterectomy. No adnexal masses. Other: No free fluid or abscess collection. No free intraperitoneal air. No soft tissue mass seen. Musculoskeletal: Subtle small lucency within the right inferior pubic ramus, suspicious for additional metastasis. Similar lytic lesion within the upper sacrum, near the midline, again suspicious for metastasis. More convincing metastases are seen within the L4 vertebral body and right iliac bone. Review of the MIP images confirms the above findings. IMPRESSION: 1. Right pleural effusion, moderate to large in size, with adjacent compressive atelectasis. Associated pulmonary edema within the right upper lobe and right lower lobe. Left lung is clear. 2. Heterogeneous low-density areas within the atelectasis at the right lung base, suspicious for neoplastic mass obscured by the surrounding atelectasis, a possible  primary carcinoma for the osseous metastases described below. 3. No pulmonary embolism. 4. Destructive changes of the proximal right humerus, almost certainly neoplastic, metastatic versus primary carcinoma, incompletely imaged. Additional metastases identified within the thoracic spine, lumbar spine and osseous pelvis, as detailed above. 5. Mild lymphadenopathy within the mediastinum and right hilum, most likely reactive, but metastatic lymphadenopathy certainly not excluded given the osseous metastases described above. 6. No acute intra-abdominal or intrapelvic abnormality. No bowel obstruction or evidence of bowel wall inflammation. No evidence of acute solid organ abnormality. Appendix is normal. These results were called by telephone at the time of interpretation on 08/31/2017 at 8:14 pm to Dr. Blanchie Dessert , who verbally acknowledged these results. Electronically Signed   By: Franki Cabot M.D.   On: 08/31/2017 20:14   Mr Jeri Cos RX Contrast  Result Date: 09/02/2017 CLINICAL DATA:  Neck pain. Possible thoracic neoplasm. Pleural effusion. Lytic bone lesions. Intractable nausea and vomiting. EXAM: MRI HEAD WITHOUT AND WITH CONTRAST MRI CERVICAL SPINE WITHOUT AND WITH CONTRAST TECHNIQUE: Multiplanar, multiecho pulse sequences of the brain and surrounding structures, and cervical spine, to include the craniocervical junction and cervicothoracic junction, were obtained without and with intravenous contrast. CONTRAST:  57mL MULTIHANCE GADOBENATE DIMEGLUMINE 529 MG/ML IV SOLN COMPARISON:  CT chest 08/31/2017. FINDINGS: MRI HEAD FINDINGS Brain: No restricted diffusion, or hemorrhage.  No intra-axial mass. Communicating hydrocephalus, with mild enlargement of the lateral, third, and fourth ventricles. Abnormal FLAIR signal in the CSF over the convexity, RIGHT greater than LEFT, is accompanied by abnormal enhancement in the subarachnoid space, consistent with leptomeningeal spread of  tumor. Reference image 43  series 23; similar findings in the posterior fossa are less impressive, for instance on image 11 series 23. There is mild superimposed atrophy. There is mild chronic microvascular ischemic change in addition to transependymal absorption. No midline shift. No extra-axial fluid. No definite discrete parenchymal metastases although post infusion imaging is mildly motion degraded. Vascular: Flow voids are maintained. Skull and upper cervical spine: Normal marrow signal. Sinuses/Orbits: Slight layering fluid in the sphenoid. Negative orbits. Other: None. MRI CERVICAL SPINE FINDINGS Alignment: Anatomic. Vertebrae: No worrisome osseous lesion. Cord: Normal signal and morphology. No abnormal enhancement of the cord or surrounding meninges. Posterior Fossa, vertebral arteries, paraspinal tissues: Vertebral arteries are patent. LEFT greater than RIGHT thyroid gland enlargement, likely goiter. Consider CT neck with contrast for further characterization. Abnormal enhancement in the posterior fossa, particularly surrounding the LEFT cerebellar tonsil. Disc levels: No disc protrusion or spinal stenosis. 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 This is most prominent over the cerebral convexity on the RIGHT. See discussion above. No definite parenchymal metastases, increased pressure, or midline shift. Unremarkable cervical spine MRI imaging. Electronically Signed   By: Staci Righter M.D.   On: 09/02/2017 14:50   Mr Cervical Spine W Wo Contrast  Result Date: 09/02/2017 CLINICAL DATA:  Neck pain. Possible thoracic neoplasm. Pleural effusion. Lytic bone lesions. Intractable nausea and vomiting. EXAM: MRI HEAD WITHOUT AND WITH CONTRAST MRI CERVICAL SPINE WITHOUT AND WITH CONTRAST TECHNIQUE: Multiplanar, multiecho pulse sequences of the brain and surrounding structures, and cervical spine, to include the craniocervical junction and cervicothoracic junction, were obtained  without and with intravenous contrast. CONTRAST:  60mL MULTIHANCE GADOBENATE DIMEGLUMINE 529 MG/ML IV SOLN COMPARISON:  CT chest 08/31/2017. FINDINGS: MRI HEAD FINDINGS Brain: No restricted diffusion, or hemorrhage.  No intra-axial mass. Communicating hydrocephalus, with mild enlargement of the lateral, third, and fourth ventricles. Abnormal FLAIR signal in the CSF over the convexity, RIGHT greater than LEFT, is accompanied by abnormal enhancement in the subarachnoid space, consistent with leptomeningeal spread of tumor. Reference image 43 series 23; similar findings in the posterior fossa are less impressive, for instance on image 11 series 23. There is mild superimposed atrophy. There is mild chronic microvascular ischemic change in addition to transependymal absorption. No midline shift. No extra-axial fluid. No definite discrete parenchymal metastases although post infusion imaging is mildly motion degraded. Vascular: Flow voids are maintained. Skull and upper cervical spine: Normal marrow signal. Sinuses/Orbits: Slight layering fluid in the sphenoid. Negative orbits. Other: None. MRI CERVICAL SPINE FINDINGS Alignment: Anatomic. Vertebrae: No worrisome osseous lesion. Cord: Normal signal and morphology. No abnormal enhancement of the cord or surrounding meninges. Posterior Fossa, vertebral arteries, paraspinal tissues: Vertebral arteries are patent. LEFT greater than RIGHT thyroid gland enlargement, likely goiter. Consider CT neck with contrast for further characterization. Abnormal enhancement in the posterior fossa, particularly surrounding the LEFT cerebellar tonsil. Disc levels: No disc protrusion or spinal stenosis. 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 This is most prominent over the cerebral convexity on the RIGHT. See discussion above. No definite parenchymal metastases, increased pressure, or midline shift. Unremarkable cervical spine MRI  imaging. Electronically Signed   By: Staci Righter M.D.   On: 09/02/2017 14:50   Ct Abdomen Pelvis W Contrast  Result Date: 08/31/2017 CLINICAL DATA:  Vomiting, chills since this morning. Right-sided body pain. Pulmonary embolism suspected, high pretest probability. EXAM: CT ANGIOGRAPHY CHEST CT ABDOMEN AND PELVIS WITH CONTRAST TECHNIQUE: Multidetector CT imaging of  the chest was performed using the standard protocol during bolus administration of intravenous contrast. Multiplanar CT image reconstructions and MIPs were obtained to evaluate the vascular anatomy. Multidetector CT imaging of the abdomen and pelvis was performed using the standard protocol during bolus administration of intravenous contrast. CONTRAST:  15mL ISOVUE-370 IOPAMIDOL (ISOVUE-370) INJECTION 76% COMPARISON:  None. FINDINGS: CTA CHEST FINDINGS Cardiovascular: Some of the most peripheral segmental and subsegmental pulmonary arteries are difficult to definitively characterize due to mild patient breathing motion artifact, however, there is no pulmonary embolism identified within the main, lobar or central segmental pulmonary arteries bilaterally. Heart size is normal. No pericardial effusion. Thoracic aorta is normal in caliber and configuration. No aneurysm or evidence of dissection. Mediastinum/Nodes: Scattered small and mildly prominent lymph nodes within the mediastinum and right hilum. Esophagus appears normal. Trachea and central bronchi are unremarkable. Lungs/Pleura: Right pleural effusion, moderate to large in size, with adjacent compressive atelectasis. Associated edema within the right upper lobe and right lower lobe. Left lung is clear. Musculoskeletal: Destructive changes of the proximal right humerus, incompletely imaged. Additional lytic foci within the T6 and T7 vertebral bodies, consistent with metastases. Probable additional lytic lesion within the medial aspects of the left tenth rib. Review of the MIP images confirms the  above findings. CT ABDOMEN and PELVIS FINDINGS Hepatobiliary: Tiny hypodense foci within the left liver lobe, much too small to definitively characterize, most likely small cysts. Gallbladder appears normal. No bile duct dilatation. Pancreas: Unremarkable. No pancreatic ductal dilatation or surrounding inflammatory changes. Spleen: Normal in size without focal abnormality. Adrenals/Urinary Tract: Adrenal glands are unremarkable. Kidneys are normal, without renal calculi, focal lesion, or hydronephrosis. Bladder is unremarkable. Stomach/Bowel: Bowel is normal in caliber. No bowel wall thickening or evidence of bowel wall inflammation. Appendix is normal. Stomach is unremarkable, partially decompressed. Vascular/Lymphatic: No significant vascular findings are present. No enlarged abdominal or pelvic lymph nodes. Reproductive: Status post hysterectomy. No adnexal masses. Other: No free fluid or abscess collection. No free intraperitoneal air. No soft tissue mass seen. Musculoskeletal: Subtle small lucency within the right inferior pubic ramus, suspicious for additional metastasis. Similar lytic lesion within the upper sacrum, near the midline, again suspicious for metastasis. More convincing metastases are seen within the L4 vertebral body and right iliac bone. Review of the MIP images confirms the above findings. IMPRESSION: 1. Right pleural effusion, moderate to large in size, with adjacent compressive atelectasis. Associated pulmonary edema within the right upper lobe and right lower lobe. Left lung is clear. 2. Heterogeneous low-density areas within the atelectasis at the right lung base, suspicious for neoplastic mass obscured by the surrounding atelectasis, a possible primary carcinoma for the osseous metastases described below. 3. No pulmonary embolism. 4. Destructive changes of the proximal right humerus, almost certainly neoplastic, metastatic versus primary carcinoma, incompletely imaged. Additional  metastases identified within the thoracic spine, lumbar spine and osseous pelvis, as detailed above. 5. Mild lymphadenopathy within the mediastinum and right hilum, most likely reactive, but metastatic lymphadenopathy certainly not excluded given the osseous metastases described above. 6. No acute intra-abdominal or intrapelvic abnormality. No bowel obstruction or evidence of bowel wall inflammation. No evidence of acute solid organ abnormality. Appendix is normal. These results were called by telephone at the time of interpretation on 08/31/2017 at 8:14 pm to Dr. Blanchie Dessert , who verbally acknowledged these results. Electronically Signed   By: Franki Cabot M.D.   On: 08/31/2017 20:14   Ir Thoracentesis Asp Pleural Space W/img Guide  Result Date: 2017/09/14  INDICATION: Patient with a right humeral lytic lesion along with a right lung lesion and a right pleural effusion. Request is made for diagnostic and therapeutic thoracentesis. EXAM: ULTRASOUND GUIDED DIAGNOSTIC AND THERAPEUTIC THORACENTESIS MEDICATIONS: 2% lidocaine COMPLICATIONS: None immediate. PROCEDURE: An ultrasound guided thoracentesis was thoroughly discussed with the patient and questions answered. The benefits, risks, alternatives and complications were also discussed. The patient understands and wishes to proceed with the procedure. Written consent was obtained. Ultrasound was performed to localize and mark an adequate pocket of fluid in the right chest. The area was then prepped and draped in the normal sterile fashion. 2% Lidocaine was used for local anesthesia. Under ultrasound guidance a Safe-T-Centesis catheter was introduced. Thoracentesis was performed. The catheter was removed and a dressing applied. FINDINGS: A total of approximately 0.45 L of serous fluid was removed. Samples were sent to the laboratory as requested by the clinical team. IMPRESSION: Successful ultrasound guided right thoracentesis yielding 0.45 L of pleural fluid.  Read by: Saverio Danker, PA-C Electronically Signed   By: Sandi Mariscal M.D.   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  No focal CNS disease at this time.  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 - Our service will continue following patient and will update our recommendations once pathological information becomes available from thoracentesis obtained on 2017/09/22.    Ardath Sax, MD 09/02/2017, 6:38 PM

## 2017-09-02 NOTE — Progress Notes (Signed)
ANTICOAGULATION CONSULT NOTE - Follow Up Consult  Pharmacy Consult for Heparin  Indication: DVT  No Known Allergies  Patient Measurements: Height: 5\' 1"  (154.9 cm) Weight: 173 lb (78.5 kg) IBW/kg (Calculated) : 47.8 Heparin Dosing Weight: 65.4 kg  Vital Signs: Temp: 98.3 F (36.8 C) (02/02 0558) Temp Source: Oral (02/02 0558) BP: 129/69 (02/02 0558) Pulse Rate: 82 (02/02 0558)  Labs: Recent Labs    08/31/17 1446 09/22/17 0551 2017/09/22 2054 09/02/17 0432  HGB 13.1 12.3  --  12.2  HCT 39.1 37.5  --  37.9  PLT 229 223  --  207  APTT  --   --  57* 114*  HEPARINUNFRC  --   --  0.41 0.95*  CREATININE 0.79  --   --  0.80    Estimated Creatinine Clearance: 63 mL/min (by C-G formula based on SCr of 0.8 mg/dL).  Assessment:  73 yo F presents on 1/31 with R pleural effusion and lung lesion. Was on Xarelto PTA for R arm DVT. Only on the second week of treatment dosing. Last Xarelto dose was 1/31 at 0800. Held for thoracentesis on 2/1.   On heparin while Xarelto on hold. Both aPTT and heparin level are elevated this AM and appear to correlate. Will now use heparin level only to dose.  Goal of Therapy:  Heparin level 0.3-0.7 units/mL Monitor platelets by anticoagulation protocol: Yes   Plan:  Dec heparin to 1100 units/hr [place], PharmD, BCPS Clinical Pharmacist Phone: [phone]

## 2017-09-02 NOTE — Progress Notes (Signed)
Pt to transfer to [facility] oncology), called and gave report to [place], Therapist, sports. Set-up transport ASAP via [place]. Pt's son at bedside, updated about progress on Pt's transfer. Pt quietly resting on bed, all questions and concerns addressed. Endorsed accordingly.

## 2017-09-02 NOTE — Progress Notes (Addendum)
PT Cancellation Note  Patient Details Name: Samantha [name] MRN: [id number] DOB: Sep 28, 1945   Cancelled Treatment:     Attempted to evaluate patient at 6, MD visiting with patient and large number of family members to discuss diagnosis of CA. PT and family very emotional and MD advises that now is not appropriate time to try and work with patient. MD also states she will likely be transferring patient to [facility] for continuation of care.  PT will hold for now and follow, please re-order when appropriate.    Reinaldo Berber, PT, DPT Acute Rehab Services Pager: [phone]     Reinaldo Berber 09/02/2017, 3:30 PM

## 2017-09-02 NOTE — Progress Notes (Signed)
ANTICOAGULATION CONSULT NOTE - Follow Up Consult  Pharmacy Consult for Heparin  Indication: DVT  No Known Allergies  Patient Measurements: Height: 5\' 1"  (154.9 cm) Weight: 173 lb (78.5 kg) IBW/kg (Calculated) : 47.8 Heparin Dosing Weight: 65.4 kg  Vital Signs: Temp: 98.4 F (36.9 C) (02/02 1501) Temp Source: Oral (02/02 1501) BP: 132/70 (02/02 1501) Pulse Rate: 85 (02/02 1501)  Labs: Recent Labs    08/31/17 1446 09-23-17 0551 23-Sep-2017 2054 09/02/17 0432 09/02/17 1431  HGB 13.1 12.3  --  12.2  --   HCT 39.1 37.5  --  37.9  --   PLT 229 223  --  207  --   APTT  --   --  57* 114* 122*  HEPARINUNFRC  --   --  0.41 0.95* 1.12*  CREATININE 0.79  --   --  0.80  --     Estimated Creatinine Clearance: 63 mL/min (by C-G formula based on SCr of 0.8 mg/dL).  Assessment:  72 yo F presents on 1/31 with R pleural effusion and lung lesion. Was on Xarelto PTA for R arm DVT. Only on the second week of treatment dosing. Last Xarelto dose was 1/31 at 0800. Held for thoracentesis on 2/1.   On heparin while Xarelto on hold. Both aPTT and heparin level are elevated this AM and appear to correlate. Will now use heparin level only to dose.  Heparin level elevated at 1.12  Goal of Therapy:  Heparin level 0.3-0.7 units/mL Monitor platelets by anticoagulation protocol: Yes   Plan:  Hold heparin x 30 mintues Dec heparin to 900 units/hr Heparin level at midnight  Thank you Anette Guarneri, PharmD [phone]

## 2017-09-02 NOTE — Plan of Care (Signed)
  Coping: Level of anxiety will decrease 09/02/2017 1008 - Progressing by Williams Che, RN   Elimination: Will not experience complications related to bowel motility 09/02/2017 1008 - Progressing by Williams Che, RN   Pain Managment: General experience of comfort will improve 09/02/2017 1008 - Progressing by Williams Che, RN   Safety: Ability to remain free from injury will improve 09/02/2017 1008 - Progressing by Williams Che, RN

## 2017-09-02 NOTE — Progress Notes (Signed)
PROGRESS NOTE    Samantha [name]  MGQ:[id number] DOB: [birth year] DOA: 08/31/2017 PCP: Patient, No Pcp Per   Brief Narrative: Samantha [name]  is a 72 y.o. 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  Patient could not make it to her MRI appointment due to the pain and weakness and had a CT of the chest and abdomen and the emergency room which showed right pleural effusion probably obscuring bronchogenic cancer which is the source of the right shoulder lytic lesion and other lesions noted in the spine and the pelvis.  I was called to admit for pain control and intractable nausea and vomiting.   Assessment & Plan:   Active Problems:   Intractable nausea and vomiting   Pleural effusion   Lytic bone lesions on xray   Lung mass   1-Right side pleural effusion and  right lung mass; Metastasis Malignancy.  -Underwent thoracentesis 2-01. -Discussed case with Dr Alvy Bimler, she recommend pulmonary consult, for patient to establish care and also pleural fluid cytology has low yield, and probably patient will need further tissue sample. Pulmonology consulted.  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 Dr Lebron Conners, agree to see patient in consultation. He recommend IV decadron Q 8 hours, transfer patient to [place] [facility], patient might need radiation treatment.  -Also Discussed MRI result with Dr pool, neurosurgery, does not appears that patient has any urgent neurosurgical needs. He recommends LP, for opening pressure, cytology and R-Tx if patient fail to this measure might need further neurosurgery evaluation.   2-Right humerus multiple lytic lesions.  Will get MRI humerus to further evaluate, might need orthopedic evaluation if impending fracture.  MRI will be done on Monday afternoon.   3-Right upper extremity DVT; was on xarelto. Now on heparin in case she required further  procedure.   4-Uncontrolled right shoulder pain; continue with oxycodone.   5-Nausea vomiting. Per patient resolved.      DVT prophylaxis: heparin  Code Status: full code.  Family Communication: multiple family member at bedside.  Disposition Plan: Transfer to [facility] long   Consultants:   Oncology  CCM     Procedures:   MRI brain;    Antimicrobials: none   Subjective: She is alert, no significant headaches.  She is hungry. Right arm pain is controlled with pain medications.   Objective: Vitals:   2017/09/18 1318 September 18, 2017 1957 2017-09-18 2013 09/02/17 0558  BP: (!) 134/7  137/79 129/69  Pulse: 86 89 95 82  Resp: 18     Temp: 98.2 F (36.8 C)  99.7 F (37.6 C) 98.3 F (36.8 C)  TempSrc: Oral  Oral Oral  SpO2: 95% 96% 97% 96%  Weight:      Height:        Intake/Output Summary (Last 24 hours) at 09/02/2017 1452 Last data filed at 09/02/2017 0400 Gross per 24 hour  Intake 535.61 ml  Output -  Net 535.61 ml   Filed Weights   08/31/17 1929  Weight: 78.5 kg (173 lb)    Examination:  General exam: NAD Respiratory system: CTA. Normal respiratory effort.  Cardiovascular system: S 1, S 2 RRR Gastrointestinal system: BS present,. Soft, nt Central nervous system: alert, able to move all extremities except right arm due to pain.  Extremities: right arm unable to move it, unclear if related to pain. Others extremities good strenghn.  Skin: No rashes, lesions or ulcers  Data Reviewed: I have personally reviewed following labs and imaging studies  CBC: Recent Labs  Lab 08/31/17 1446 09-10-17 0551 09/02/17 0432  WBC 7.9 6.3 5.6  HGB 13.1 12.3 12.2  HCT 39.1 37.5 37.9  MCV 88.3 88.4 89.6  PLT 229 223 160   Basic Metabolic Panel: Recent Labs  Lab 08/31/17 1446 09/02/17 0432  NA 136 136  K 4.1 4.6  CL 103 103  CO2 20* 21*  GLUCOSE 115* 111*  BUN 12 14  CREATININE 0.79 0.80  CALCIUM 9.6 9.2   GFR: Estimated Creatinine Clearance: 63 mL/min (by  C-G formula based on SCr of 0.8 mg/dL). Liver Function Tests: Recent Labs  Lab 08/31/17 1446  AST 20  ALT 14  ALKPHOS 107  BILITOT 0.6  PROT 7.3  ALBUMIN 3.9   Recent Labs  Lab 08/31/17 1446 09/10/2017 0946  LIPASE 26  --   AMYLASE  --  351*   No results for input(s): AMMONIA in the last 168 hours. Coagulation Profile: No results for input(s): INR, PROTIME in the last 168 hours. Cardiac Enzymes: No results for input(s): CKTOTAL, CKMB, CKMBINDEX, TROPONINI in the last 168 hours. BNP (last 3 results) No results for input(s): PROBNP in the last 8760 hours. HbA1C: No results for input(s): HGBA1C in the last 72 hours. CBG: No results for input(s): GLUCAP in the last 168 hours. Lipid Profile: No results for input(s): CHOL, HDL, LDLCALC, TRIG, CHOLHDL, LDLDIRECT in the last 72 hours. Thyroid Function Tests: No results for input(s): TSH, T4TOTAL, FREET4, T3FREE, THYROIDAB in the last 72 hours. Anemia Panel: No results for input(s): VITAMINB12, FOLATE, FERRITIN, TIBC, IRON, RETICCTPCT in the last 72 hours. Sepsis Labs: Recent Labs  Lab 08/31/17 1900  LATICACIDVEN 0.76    Recent Results (from the past 240 hour(s))  Acid Fast Smear (AFB)     Status: None   Collection Time: Sep 10, 2017  9:21 AM  Result Value Ref Range Status   AFB Specimen Processing Concentration  Final   Acid Fast Smear Negative  Final    Comment: (NOTE) Performed At: [facility] [place], [place] [id number] Rush Farmer MD DU:[id number]    Source (AFB) PLEURAL  Final    Comment: RIGHT Performed at [facility], [place] [address], [place], [place] [address]   Culture, body fluid-bottle     Status: None (Preliminary result)   Collection Time: 09/10/2017  9:21 AM  Result Value Ref Range Status   Specimen Description PLEURAL RIGHT  Final   Special Requests NONE  Final   Culture   Final    NO GROWTH < 24 HOURS Performed at [facility], [place] [address] [place], [place]  [address]    Report Status PENDING  Incomplete  Gram stain     Status: None   Collection Time: 09/10/2017  9:21 AM  Result Value Ref Range Status   Specimen Description PLEURAL RIGHT  Final   Special Requests NONE  Final   Gram Stain   Final    RARE WBC PRESENT, PREDOMINANTLY MONONUCLEAR NO ORGANISMS SEEN Performed at [facility], [address] [place], [place] [address]    Report Status 2017-09-10 FINAL  Final         Radiology Studies: Dg Chest 1 View  Result Date: 09/10/17 CLINICAL DATA:  Thoracentesis. EXAM: CHEST 1 VIEW COMPARISON:  CT 08/31/2016. FINDINGS: Mediastinum and hilar structures are normal. Heart size normal. Atelectatic changes right lung base. Small right pleural effusion.  No pneumothorax destructive right humeral lesion best identified on CT. IMPRESSION: 1. Persistent atelectasis/mass right lung base. Reference is made to prior CT report 08/31/2016. 2.  Destructive right humeral lesion best identified on prior CT. Electronically Signed   By: Marcello Moores  Register   On: 09-05-17 09:39   Ct Chest Wo Contrast  Result Date: 2017-09-05 CLINICAL DATA:  Status post thoracentesis. Evaluate right lower lobe process. EXAM: CT CHEST WITHOUT CONTRAST TECHNIQUE: Multidetector CT imaging of the chest was performed following the standard protocol without IV contrast. COMPARISON:  CT chest, abdomen and pelvis 08/31/2017 FINDINGS: Cardiovascular: The heart is normal in size. No pericardial effusion. The aorta is normal in caliber. No atherosclerotic calcifications. No significant coronary artery calcifications. Mediastinum/Nodes: Mediastinal and right hilar lymphadenopathy. Anterior mediastinal node on image number 40 measures 7 mm. 10 mm pretracheal lymph node on image number 43. 9 mm aorticopulmonary window node on image number 43. 13 mm subcarinal lymph node on the right side on image number 57. The esophagus is grossly normal. Lungs/Pleura: Status post right-sided thoracentesis with  a small residual pleural effusion. There is a 6 x 5 cm masslike lesion in the right upper lobe worrisome for neoplasm given the adenopathy and the destructive metastatic bone disease. 4 mm nodule noted at the left lung base on image number 86. Do not see any other obvious pulmonary nodules to suggest pulmonary metastatic disease. Upper Abdomen: No obvious hepatic or adrenal gland metastasis. Contrast in the gallbladder related to vicarious excretion. Musculoskeletal: No obvious breast masses, supraclavicular or axillary adenopathy. Enlarged multinodular thyroid goiter is noted. Again demonstrated is lytic metastatic disease involving the thoracic vertebral bodies, a few ribs and possibly the sternum. T7 is the most affected but no definite canal encroachment. MRI thoracic spine may be helpful for further evaluation. IMPRESSION: 1. Status post right thoracentesis. 6 x 5 cm masslike lesion in the right lower lobe worrisome for primary lung neoplasm given the mediastinal and right hilar adenopathy and diffuse osseous metastatic disease. Recommend correlation with recent right pleural fluid. PET-CT may be helpful to assess for safest biopsy route. 2. 4 mm left lower lobe pulmonary nodules but no other definite pulmonary lesions. 3. No definite upper abdominal metastatic disease. 4. Multinodular thyroid goiter. 5. No obvious breast masses. Electronically Signed   By: Marijo Sanes M.D.   On: September 05, 2017 16:27   Ct Angio Chest Pe W And/or Wo Contrast  Result Date: 08/31/2017 CLINICAL DATA:  Vomiting, chills since this morning. Right-sided body pain. Pulmonary embolism suspected, high pretest probability. EXAM: CT ANGIOGRAPHY CHEST CT ABDOMEN AND PELVIS WITH CONTRAST TECHNIQUE: Multidetector CT imaging of the chest was performed using the standard protocol during bolus administration of intravenous contrast. Multiplanar CT image reconstructions and MIPs were obtained to evaluate the vascular anatomy. Multidetector CT  imaging of the abdomen and pelvis was performed using the standard protocol during bolus administration of intravenous contrast. CONTRAST:  148mL ISOVUE-370 IOPAMIDOL (ISOVUE-370) INJECTION 76% COMPARISON:  None. FINDINGS: CTA CHEST FINDINGS Cardiovascular: Some of the most peripheral segmental and subsegmental pulmonary arteries are difficult to definitively characterize due to mild patient breathing motion artifact, however, there is no pulmonary embolism identified within the main, lobar or central segmental pulmonary arteries bilaterally. Heart size is normal. No pericardial effusion. Thoracic aorta is normal in caliber and configuration. No aneurysm or evidence of dissection. Mediastinum/Nodes: Scattered small and mildly prominent lymph nodes within the mediastinum and right hilum. Esophagus appears normal. Trachea and central bronchi are unremarkable. Lungs/Pleura: Right pleural  effusion, moderate to large in size, with adjacent compressive atelectasis. Associated edema within the right upper lobe and right lower lobe. Left lung is clear. Musculoskeletal: Destructive changes of the proximal right humerus, incompletely imaged. Additional lytic foci within the T6 and T7 vertebral bodies, consistent with metastases. Probable additional lytic lesion within the medial aspects of the left tenth rib. Review of the MIP images confirms the above findings. CT ABDOMEN and PELVIS FINDINGS Hepatobiliary: Tiny hypodense foci within the left liver lobe, much too small to definitively characterize, most likely small cysts. Gallbladder appears normal. No bile duct dilatation. Pancreas: Unremarkable. No pancreatic ductal dilatation or surrounding inflammatory changes. Spleen: Normal in size without focal abnormality. Adrenals/Urinary Tract: Adrenal glands are unremarkable. Kidneys are normal, without renal calculi, focal lesion, or hydronephrosis. Bladder is unremarkable. Stomach/Bowel: Bowel is normal in caliber. No bowel wall  thickening or evidence of bowel wall inflammation. Appendix is normal. Stomach is unremarkable, partially decompressed. Vascular/Lymphatic: No significant vascular findings are present. No enlarged abdominal or pelvic lymph nodes. Reproductive: Status post hysterectomy. No adnexal masses. Other: No free fluid or abscess collection. No free intraperitoneal air. No soft tissue mass seen. Musculoskeletal: Subtle small lucency within the right inferior pubic ramus, suspicious for additional metastasis. Similar lytic lesion within the upper sacrum, near the midline, again suspicious for metastasis. More convincing metastases are seen within the L4 vertebral body and right iliac bone. Review of the MIP images confirms the above findings. IMPRESSION: 1. Right pleural effusion, moderate to large in size, with adjacent compressive atelectasis. Associated pulmonary edema within the right upper lobe and right lower lobe. Left lung is clear. 2. Heterogeneous low-density areas within the atelectasis at the right lung base, suspicious for neoplastic mass obscured by the surrounding atelectasis, a possible primary carcinoma for the osseous metastases described below. 3. No pulmonary embolism. 4. Destructive changes of the proximal right humerus, almost certainly neoplastic, metastatic versus primary carcinoma, incompletely imaged. Additional metastases identified within the thoracic spine, lumbar spine and osseous pelvis, as detailed above. 5. Mild lymphadenopathy within the mediastinum and right hilum, most likely reactive, but metastatic lymphadenopathy certainly not excluded given the osseous metastases described above. 6. No acute intra-abdominal or intrapelvic abnormality. No bowel obstruction or evidence of bowel wall inflammation. No evidence of acute solid organ abnormality. Appendix is normal. These results were called by telephone at the time of interpretation on 08/31/2017 at 8:14 pm to Dr. Blanchie Dessert , who  verbally acknowledged these results. Electronically Signed   By: Franki Cabot M.D.   On: 08/31/2017 20:14   Ct Abdomen Pelvis W Contrast  Result Date: 08/31/2017 CLINICAL DATA:  Vomiting, chills since this morning. Right-sided body pain. Pulmonary embolism suspected, high pretest probability. EXAM: CT ANGIOGRAPHY CHEST CT ABDOMEN AND PELVIS WITH CONTRAST TECHNIQUE: Multidetector CT imaging of the chest was performed using the standard protocol during bolus administration of intravenous contrast. Multiplanar CT image reconstructions and MIPs were obtained to evaluate the vascular anatomy. Multidetector CT imaging of the abdomen and pelvis was performed using the standard protocol during bolus administration of intravenous contrast. CONTRAST:  131mL ISOVUE-370 IOPAMIDOL (ISOVUE-370) INJECTION 76% COMPARISON:  None. FINDINGS: CTA CHEST FINDINGS Cardiovascular: Some of the most peripheral segmental and subsegmental pulmonary arteries are difficult to definitively characterize due to mild patient breathing motion artifact, however, there is no pulmonary embolism identified within the main, lobar or central segmental pulmonary arteries bilaterally. Heart size is normal. No pericardial effusion. Thoracic aorta is normal in caliber and configuration. No aneurysm  or evidence of dissection. Mediastinum/Nodes: Scattered small and mildly prominent lymph nodes within the mediastinum and right hilum. Esophagus appears normal. Trachea and central bronchi are unremarkable. Lungs/Pleura: Right pleural effusion, moderate to large in size, with adjacent compressive atelectasis. Associated edema within the right upper lobe and right lower lobe. Left lung is clear. Musculoskeletal: Destructive changes of the proximal right humerus, incompletely imaged. Additional lytic foci within the T6 and T7 vertebral bodies, consistent with metastases. Probable additional lytic lesion within the medial aspects of the left tenth rib. Review of  the MIP images confirms the above findings. CT ABDOMEN and PELVIS FINDINGS Hepatobiliary: Tiny hypodense foci within the left liver lobe, much too small to definitively characterize, most likely small cysts. Gallbladder appears normal. No bile duct dilatation. Pancreas: Unremarkable. No pancreatic ductal dilatation or surrounding inflammatory changes. Spleen: Normal in size without focal abnormality. Adrenals/Urinary Tract: Adrenal glands are unremarkable. Kidneys are normal, without renal calculi, focal lesion, or hydronephrosis. Bladder is unremarkable. Stomach/Bowel: Bowel is normal in caliber. No bowel wall thickening or evidence of bowel wall inflammation. Appendix is normal. Stomach is unremarkable, partially decompressed. Vascular/Lymphatic: No significant vascular findings are present. No enlarged abdominal or pelvic lymph nodes. Reproductive: Status post hysterectomy. No adnexal masses. Other: No free fluid or abscess collection. No free intraperitoneal air. No soft tissue mass seen. Musculoskeletal: Subtle small lucency within the right inferior pubic ramus, suspicious for additional metastasis. Similar lytic lesion within the upper sacrum, near the midline, again suspicious for metastasis. More convincing metastases are seen within the L4 vertebral body and right iliac bone. Review of the MIP images confirms the above findings. IMPRESSION: 1. Right pleural effusion, moderate to large in size, with adjacent compressive atelectasis. Associated pulmonary edema within the right upper lobe and right lower lobe. Left lung is clear. 2. Heterogeneous low-density areas within the atelectasis at the right lung base, suspicious for neoplastic mass obscured by the surrounding atelectasis, a possible primary carcinoma for the osseous metastases described below. 3. No pulmonary embolism. 4. Destructive changes of the proximal right humerus, almost certainly neoplastic, metastatic versus primary carcinoma, incompletely  imaged. Additional metastases identified within the thoracic spine, lumbar spine and osseous pelvis, as detailed above. 5. Mild lymphadenopathy within the mediastinum and right hilum, most likely reactive, but metastatic lymphadenopathy certainly not excluded given the osseous metastases described above. 6. No acute intra-abdominal or intrapelvic abnormality. No bowel obstruction or evidence of bowel wall inflammation. No evidence of acute solid organ abnormality. Appendix is normal. These results were called by telephone at the time of interpretation on 08/31/2017 at 8:14 pm to Dr. Blanchie Dessert , who verbally acknowledged these results. Electronically Signed   By: Franki Cabot M.D.   On: 08/31/2017 20:14   Ir Thoracentesis Asp Pleural Space W/img Guide  Result Date: 2017/09/25 INDICATION: Patient with a right humeral lytic lesion along with a right lung lesion and a right pleural effusion. Request is made for diagnostic and therapeutic thoracentesis. EXAM: ULTRASOUND GUIDED DIAGNOSTIC AND THERAPEUTIC THORACENTESIS MEDICATIONS: 2% lidocaine COMPLICATIONS: None immediate. PROCEDURE: An ultrasound guided thoracentesis was thoroughly discussed with the patient and questions answered. The benefits, risks, alternatives and complications were also discussed. The patient understands and wishes to proceed with the procedure. Written consent was obtained. Ultrasound was performed to localize and mark an adequate pocket of fluid in the right chest. The area was then prepped and draped in the normal sterile fashion. 2% Lidocaine was used for local anesthesia. Under ultrasound guidance a Safe-T-Centesis catheter was  introduced. Thoracentesis was performed. The catheter was removed and a dressing applied. FINDINGS: A total of approximately 0.45 L of serous fluid was removed. Samples were sent to the laboratory as requested by the clinical team. IMPRESSION: Successful ultrasound guided right thoracentesis yielding 0.45 L  of pleural fluid. Read by: Saverio Danker, PA-C Electronically Signed   By: Sandi Mariscal M.D.   On: 09/05/2017 09:58        Scheduled Meds: . feeding supplement (ENSURE ENLIVE)  237 mL Oral BID BM  . polyethylene glycol  17 g Oral BID  . senna  1 tablet Oral Daily  . sodium chloride flush  3 mL Intravenous Q12H   Continuous Infusions: . sodium chloride    . heparin 1,100 Units/hr (09/02/17 1054)     LOS: 2 days    Time spent: 35 minutes.     Elmarie Shiley, MD [facility] Pager [phone]  If 7PM-7AM, please contact night-coverage [email or web address] Password [email or web address] 09/02/2017, 2:52 PM

## 2017-09-03 DIAGNOSIS — Z9889 Other specified postprocedural states: Secondary | ICD-10-CM

## 2017-09-03 LAB — CBC
HEMATOCRIT: 38 % (ref 36.0–46.0)
HEMOGLOBIN: 12.6 g/dL (ref 12.0–15.0)
MCH: 29 pg (ref 26.0–34.0)
MCHC: 33.2 g/dL (ref 30.0–36.0)
MCV: 87.4 fL (ref 78.0–100.0)
PLATELETS: 230 10*3/uL (ref 150–400)
RBC: 4.35 MIL/uL (ref 3.87–5.11)
RDW: 13.4 % (ref 11.5–15.5)
WBC: 5.4 10*3/uL (ref 4.0–10.5)

## 2017-09-03 LAB — HEPARIN LEVEL (UNFRACTIONATED)
Heparin Unfractionated: 0.41 IU/mL (ref 0.30–0.70)
Heparin Unfractionated: 0.71 IU/mL — ABNORMAL HIGH (ref 0.30–0.70)
Heparin Unfractionated: 0.76 IU/mL — ABNORMAL HIGH (ref 0.30–0.70)

## 2017-09-03 MED ORDER — HEPARIN (PORCINE) IN NACL 100-0.45 UNIT/ML-% IJ SOLN
750.0000 [IU]/h | INTRAMUSCULAR | Status: DC
  Administered 2017-09-03: 700 [IU]/h via INTRAVENOUS
  Filled 2017-09-03 (×2): qty 250

## 2017-09-03 NOTE — Progress Notes (Signed)
ANTICOAGULATION CONSULT NOTE - Follow Up Consult  Pharmacy Consult for Heparin Indication: DVT  No Known Allergies  Patient Measurements: Height: 5\' 1"  (154.9 cm) Weight: 156 lb 1.4 oz (70.8 kg) IBW/kg (Calculated) : 47.8 Heparin Dosing Weight: 63.1 kg  Vital Signs: Temp: 98.2 F (36.8 C) (02/03 1331) Temp Source: Oral (02/03 1331) BP: 105/74 (02/03 1331) Pulse Rate: 90 (02/03 1331)  Labs: Recent Labs    2017-09-06 0551  06-Sep-2017 2054 09/02/17 0432 09/02/17 1431 09/03/17 0015 09/03/17 1027 09/03/17 1820  HGB 12.3  --   --  12.2  --  12.6  --   --   HCT 37.5  --   --  37.9  --  38.0  --   --   PLT 223  --   --  207  --  230  --   --   APTT  --   --  57* 114* 122*  --   --   --   HEPARINUNFRC  --    < > 0.41 0.95* 1.12* 0.76* 0.71* 0.41  CREATININE  --   --   --  0.80  --   --   --   --    < > = values in this interval not displayed.    Estimated Creatinine Clearance: 59.7 mL/min (by C-G formula based on SCr of 0.8 mg/dL).   Medications:  Infusions:  . sodium chloride    . heparin 700 Units/hr (09/03/17 1602)    Assessment: 72 yo F presents on 1/31 with R pleural effusion and lung lesion. Was on Xarelto PTA for R arm DVT, but only on the second week of treatment dosing. Xarelto was held for thoracentesis on 2/1; last Xarelto dose was 1/31 at 0800. Pharmacy is consulted to dose heparin while Xarelto on hold.  Today, 09/03/2017: Heparin level now therapeutic after most recent dose reduction CBC: Hgb and Plt remain stable/WNL No bleeding or IV complications reported.  Goal of Therapy:  Anti-Xa level 0.3-0.7 units/mL Monitor platelets by anticoagulation protocol: Yes   Plan:   Continue heparin IV infusion at 700 units/hr  Recheck confirmatory Heparin level in 6 hours  Daily heparin level and CBC  Continue to monitor H&H and platelets   Reuel Boom, PharmD, BCPS [phone] 09/03/2017, 7:45 PM

## 2017-09-03 NOTE — Progress Notes (Signed)
ANTICOAGULATION CONSULT NOTE - Follow Up Consult  Pharmacy Consult for Heparin Indication: DVT  No Known Allergies  Patient Measurements: Height: 5\' 1"  (154.9 cm) Weight: 156 lb 1.4 oz (70.8 kg) IBW/kg (Calculated) : 47.8 Heparin Dosing Weight: 63.1 kg  Vital Signs: Temp: 98.4 F (36.9 C) (02/03 0452) Temp Source: Oral (02/03 0452) BP: 105/86 (02/03 0452) Pulse Rate: 87 (02/03 0452)  Labs: Recent Labs    08/31/17 1446 September 08, 2017 0551  2017-09-08 2054 09/02/17 0432 09/02/17 1431 09/03/17 0015  HGB 13.1 12.3  --   --  12.2  --  12.6  HCT 39.1 37.5  --   --  37.9  --  38.0  PLT 229 223  --   --  207  --  230  APTT  --   --   --  57* 114* 122*  --   HEPARINUNFRC  --   --    < > 0.41 0.95* 1.12* 0.76*  CREATININE 0.79  --   --   --  0.80  --   --    < > = values in this interval not displayed.    Estimated Creatinine Clearance: 59.7 mL/min (by C-G formula based on SCr of 0.8 mg/dL).   Medications:  Infusions:  . sodium chloride    . heparin 750 Units/hr (09/03/17 0157)    Assessment: 72 yo F presents on 1/31 with R pleural effusion and lung lesion. Was on Xarelto PTA for R arm DVT, but only on the second week of treatment dosing. Xarelto was held for thoracentesis on 2/1; last Xarelto dose was 1/31 at 0800.  Pharmacy is consulted to dose heparin while Xarelto on hold.  Today, 09/03/2017: Heparin level 0.71, remains supratherapeutic, but improving after dose reductions CBC: Hgb and Plt remain stable/WNL No bleeding or IV complications reported.  Goal of Therapy:  INR 2-3 Monitor platelets by anticoagulation protocol: Yes   Plan:  Decrease to heparin IV infusion at 700 units/hr Heparin level in 6 hours after rate change Daily heparin level and CBC Continue to monitor H&H and platelets   Gretta Arab PharmD, BCPS Pager [phone] 09/03/2017 11:14 AM

## 2017-09-03 NOTE — Progress Notes (Signed)
IP PROGRESS NOTE  Subjective:  Patient transferred to [facility] long hospital in anticipation of possible need for radiation oncology assessment tomorrow.  Overnight, did better after initiation of steroids with resolution of headache.  Currently reports feeling well with no active complaints other than persistent severe pain in the right shoulder at the known site of destructive skeletal lesion.  Objective: Vital signs in last 24 hours: Blood pressure 105/86, pulse 87, temperature 98.4 F (36.9 C), temperature source Oral, resp. rate 16, height 5\' 1"  (1.549 m), weight 156 lb 1.4 oz (70.8 kg), SpO2 96 %.  Intake/Output from previous day: 02/02 0701 - 02/03 0700 In: 360 [P.O.:360] Out: -   Physical Exam:  Alert, awake, oriented x3.  No acute distress other than pain in the right shoulder HEENT: Anicteric sclera, moist mucous membranes.  PERRLA, EOMI. Lungs: Clear to auscultation bilaterally, no expiratory wheezing Cardiac: S1/S2, regular.  No murmurs/rubs/gallops Abdomen: Soft, nontender, nondistended.  Bowel sounds are normoactive Extremities: No lower extremity edema Neuro: No gross focal neurological deficits   Lab Results: Recent Labs    09/02/17 0432 09/03/17 0015  WBC 5.6 5.4  HGB 12.2 12.6  HCT 37.9 38.0  PLT 207 230    BMET Recent Labs    08/31/17 1446 09/02/17 0432  NA 136 136  K 4.1 4.6  CL 103 103  CO2 20* 21*  GLUCOSE 115* 111*  BUN 12 14  CREATININE 0.79 0.80  CALCIUM 9.6 9.2    No results found for: CEA1  Studies/Results: Ct Chest Wo Contrast  Result Date: September 05, 2017 CLINICAL DATA:  Status post thoracentesis. Evaluate right lower lobe process. EXAM: CT CHEST WITHOUT CONTRAST TECHNIQUE: Multidetector CT imaging of the chest was performed following the standard protocol without IV contrast. COMPARISON:  CT chest, abdomen and pelvis 08/31/2017 FINDINGS: Cardiovascular: The heart is normal in size. No pericardial effusion. The aorta is normal in caliber.  No atherosclerotic calcifications. No significant coronary artery calcifications. Mediastinum/Nodes: Mediastinal and right hilar lymphadenopathy. Anterior mediastinal node on image number 40 measures 7 mm. 10 mm pretracheal lymph node on image number 43. 9 mm aorticopulmonary window node on image number 43. 13 mm subcarinal lymph node on the right side on image number 57. The esophagus is grossly normal. Lungs/Pleura: Status post right-sided thoracentesis with a small residual pleural effusion. There is a 6 x 5 cm masslike lesion in the right upper lobe worrisome for neoplasm given the adenopathy and the destructive metastatic bone disease. 4 mm nodule noted at the left lung base on image number 86. Do not see any other obvious pulmonary nodules to suggest pulmonary metastatic disease. Upper Abdomen: No obvious hepatic or adrenal gland metastasis. Contrast in the gallbladder related to vicarious excretion. Musculoskeletal: No obvious breast masses, supraclavicular or axillary adenopathy. Enlarged multinodular thyroid goiter is noted. Again demonstrated is lytic metastatic disease involving the thoracic vertebral bodies, a few ribs and possibly the sternum. T7 is the most affected but no definite canal encroachment. MRI thoracic spine may be helpful for further evaluation. IMPRESSION: 1. Status post right thoracentesis. 6 x 5 cm masslike lesion in the right lower lobe worrisome for primary lung neoplasm given the mediastinal and right hilar adenopathy and diffuse osseous metastatic disease. Recommend correlation with recent right pleural fluid. PET-CT may be helpful to assess for safest biopsy route. 2. 4 mm left lower lobe pulmonary nodules but no other definite pulmonary lesions. 3. No definite upper abdominal metastatic disease. 4. Multinodular thyroid goiter. 5. No obvious breast masses.  Electronically Signed   By: Marijo Sanes M.D.   On: 09/10/17 16:27   Mr Jeri Cos TD Contrast  Result Date:  09/02/2017 CLINICAL DATA:  Neck pain. Possible thoracic neoplasm. Pleural effusion. Lytic bone lesions. Intractable nausea and vomiting. EXAM: MRI HEAD WITHOUT AND WITH CONTRAST MRI CERVICAL SPINE WITHOUT AND WITH CONTRAST TECHNIQUE: Multiplanar, multiecho pulse sequences of the brain and surrounding structures, and cervical spine, to include the craniocervical junction and cervicothoracic junction, were obtained without and with intravenous contrast. CONTRAST:  56mL MULTIHANCE GADOBENATE DIMEGLUMINE 529 MG/ML IV SOLN COMPARISON:  CT chest 08/31/2017. FINDINGS: MRI HEAD FINDINGS Brain: No restricted diffusion, or hemorrhage.  No intra-axial mass. Communicating hydrocephalus, with mild enlargement of the lateral, third, and fourth ventricles. Abnormal FLAIR signal in the CSF over the convexity, RIGHT greater than LEFT, is accompanied by abnormal enhancement in the subarachnoid space, consistent with leptomeningeal spread of tumor. Reference image 43 series 23; similar findings in the posterior fossa are less impressive, for instance on image 11 series 23. There is mild superimposed atrophy. There is mild chronic microvascular ischemic change in addition to transependymal absorption. No midline shift. No extra-axial fluid. No definite discrete parenchymal metastases although post infusion imaging is mildly motion degraded. Vascular: Flow voids are maintained. Skull and upper cervical spine: Normal marrow signal. Sinuses/Orbits: Slight layering fluid in the sphenoid. Negative orbits. Other: None. MRI CERVICAL SPINE FINDINGS Alignment: Anatomic. Vertebrae: No worrisome osseous lesion. Cord: Normal signal and morphology. No abnormal enhancement of the cord or surrounding meninges. Posterior Fossa, vertebral arteries, paraspinal tissues: Vertebral arteries are patent. LEFT greater than RIGHT thyroid gland enlargement, likely goiter. Consider CT neck with contrast for further characterization. Abnormal enhancement in the  posterior fossa, particularly surrounding the LEFT cerebellar tonsil. Disc levels: No disc protrusion or spinal stenosis. 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 This is most prominent over the cerebral convexity on the RIGHT. See discussion above. No definite parenchymal metastases, increased pressure, or midline shift. Unremarkable cervical spine MRI imaging. Electronically Signed   By: Staci Righter M.D.   On: 09/02/2017 14:50   Mr Cervical Spine W Wo Contrast  Result Date: 09/02/2017 CLINICAL DATA:  Neck pain. Possible thoracic neoplasm. Pleural effusion. Lytic bone lesions. Intractable nausea and vomiting. EXAM: MRI HEAD WITHOUT AND WITH CONTRAST MRI CERVICAL SPINE WITHOUT AND WITH CONTRAST TECHNIQUE: Multiplanar, multiecho pulse sequences of the brain and surrounding structures, and cervical spine, to include the craniocervical junction and cervicothoracic junction, were obtained without and with intravenous contrast. CONTRAST:  81mL MULTIHANCE GADOBENATE DIMEGLUMINE 529 MG/ML IV SOLN COMPARISON:  CT chest 08/31/2017. FINDINGS: MRI HEAD FINDINGS Brain: No restricted diffusion, or hemorrhage.  No intra-axial mass. Communicating hydrocephalus, with mild enlargement of the lateral, third, and fourth ventricles. Abnormal FLAIR signal in the CSF over the convexity, RIGHT greater than LEFT, is accompanied by abnormal enhancement in the subarachnoid space, consistent with leptomeningeal spread of tumor. Reference image 43 series 23; similar findings in the posterior fossa are less impressive, for instance on image 11 series 23. There is mild superimposed atrophy. There is mild chronic microvascular ischemic change in addition to transependymal absorption. No midline shift. No extra-axial fluid. No definite discrete parenchymal metastases although post infusion imaging is mildly motion degraded. Vascular: Flow voids are maintained. Skull and upper  cervical spine: Normal marrow signal. Sinuses/Orbits: Slight layering fluid in the sphenoid. Negative orbits. Other: None. MRI CERVICAL SPINE FINDINGS Alignment: Anatomic. Vertebrae: No worrisome osseous lesion. Cord: Normal signal and  morphology. No abnormal enhancement of the cord or surrounding meninges. Posterior Fossa, vertebral arteries, paraspinal tissues: Vertebral arteries are patent. LEFT greater than RIGHT thyroid gland enlargement, likely goiter. Consider CT neck with contrast for further characterization. Abnormal enhancement in the posterior fossa, particularly surrounding the LEFT cerebellar tonsil. Disc levels: No disc protrusion or spinal stenosis. 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 This is most prominent over the cerebral convexity on the RIGHT. See discussion above. No definite parenchymal metastases, increased pressure, or midline shift. Unremarkable cervical spine MRI imaging. Electronically Signed   By: Staci Righter M.D.   On: 09/02/2017 14:50    Medications: I have reviewed the patient's current medications.  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  No focal CNS disease at this time.  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    Headache responded well to steroid initiation.  No new neurological symptoms today.  Recommendations: -Recommend MRI of the thoracic and lumbar spine with and without contrast to complete spinal  imaging for possible additional foci of leptomeningeal disease -Continue dexamethasone 8 mg every 8 hours -Consult radiation oncology on Monday for evaluation for possible radiation therapy for the leptomeningeal disease. - Our service will continue following patient and will update our recommendations once pathological information becomes available from thoracentesis obtained on 09-07-2017.   LOS: 3 days   Ardath Sax, MD   09/03/2017, 9:51 AM

## 2017-09-03 NOTE — Progress Notes (Signed)
Patient Demographics:    Samantha [name], is a 72 y.o. female, DOB - [birth year], HAL:[id number]  Admit date - 08/31/2017   Admitting Physician Merton Border, MD  Outpatient Primary MD for the patient is Patient, No Pcp Per  LOS - 3   Chief Complaint  Patient presents with  . 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 No focal CNS disease at this time.  Transferred from [facility] to [facility] on 09/02/2017 for possible radiation therapy, she was on Xarelto for DVT . Cytology from thoracentesis of 09-25-17 pending    Plan:-  1)Metastatic lung cancer with Leptomeningeal disease----  lumbar puncture with check of opening pressure and cytology is pending, MRI of the thoracic and lumbar spine with and without contrast pending in an attempt to look for additional foci of leptomeningeal disease, okay to continue dexamethasone 8 mg every 8 hours as this has been effective for headaches, radiation oncology consult on 09/04/2017 patient may benefit from radiation therapy for leptomeningeal disease, cytology from thoracentesis of 09/25/17  pending. MRI result previously reviewed with Dr pool, neurosurgery, does not appears that patient has any urgent neurosurgical needs. He recommends LP, for opening pressure, cytology and R-Tx if patient fail to this measure might need further neurosurgery evaluation.   2) right humerus with multiple lytic lesions-further MRI pending, may need orthopedic consultation if impending fracture  3) right upper extremity DVT-Xarelto on hold, continue IV heparin to allow for procedures.  Resume Xarelto once surgical procedures are completed  4) headaches with nausea and vomiting-secondary to leptomeningeal disease in the setting of metastatic lung cancer, improved with Decadron  Code Status : Full code  Disposition Plan  : TBD  Consults  : Oncology Dr. Lebron Conners, neurosurgery Dr. Annette Stable   DVT Prophylaxis  : iv Heparin   Lab Results  Component Value Date   PLT 230 09/03/2017    Inpatient Medications  Scheduled Meds: . dexamethasone  8 mg Intravenous Q8H  . feeding supplement (ENSURE ENLIVE)  237 mL Oral BID BM  . polyethylene glycol  17 g Oral BID  . senna  1 tablet Oral Daily  . sodium chloride flush  3 mL Intravenous Q12H   Continuous Infusions: . sodium chloride    . heparin 700 Units/hr (09/03/17 1602)   PRN Meds:.sodium chloride, acetaminophen **OR** acetaminophen, albuterol, lidocaine, morphine injection, ondansetron **OR** ondansetron (ZOFRAN) IV, oxyCODONE, sodium chloride flush, zolpidem    Anti-infectives (From admission, onward)   None  Objective:   Vitals:   09/02/17 1948 09/02/17 2129 09/03/17 0452 09/03/17 1331  BP: 126/63 (!) 141/90 105/86 105/74  Pulse: 85 (!) 51 87 90  Resp:  14 16 16   Temp: 98.3 F (36.8 C) 98 F (36.7 C) 98.4 F (36.9 C) 98.2 F (36.8 C)  TempSrc: Oral Oral Oral Oral  SpO2: 94% 95% 96% 98%  Weight:  70.8 kg (156 lb 1.4 oz)    Height:  5\' 1"  (1.549 m)      Wt Readings from Last 3 Encounters:  09/02/17 70.8 kg (156 lb 1.4 oz)    08/07/17 78.5 kg (173 lb)  05/18/16 78.5 kg (173 lb)     Intake/Output Summary (Last 24 hours) at 09/03/2017 1703 Last data filed at 09/03/2017 1207 Gross per 24 hour  Intake 363 ml  Output -  Net 363 ml     Physical Exam  Gen:- Awake Alert, in no apparent distress, complains of right shoulder pain HEENT:- [place].AT, No sclera icterus Neck-Supple Neck,No JVD,.  Lungs-  CTAB  CV- S1, S2 normal Abd-  +ve B.Sounds, Abd Soft, No tenderness,    Extremity/Skin:- No  edema,    Psych-affect is appropriate Neuro-generalized weakness without new focal deficits    Data Review:   Micro Results Recent Results (from the past 240 hour(s))  Acid Fast Smear (AFB)     Status: None   Collection Time: Sep 08, 2017  9:21 AM  Result Value Ref Range Status   AFB Specimen Processing Concentration  Final   Acid Fast Smear Negative  Final    Comment: (NOTE) Performed At: [facility] [place], [place] [id number] Rush Farmer MD [id number]    Source (AFB) PLEURAL  Final    Comment: RIGHT Performed at [facility], [place] [address], [place], [place] [address]   Culture, body fluid-bottle     Status: None (Preliminary result)   Collection Time: 09-08-2017  9:21 AM  Result Value Ref Range Status   Specimen Description PLEURAL RIGHT  Final   Special Requests NONE  Final   Culture   Final    NO GROWTH 2 DAYS Performed at [facility], [place] [address], [place], [place] [address]    Report Status PENDING  Incomplete  Gram stain     Status: None   Collection Time: 08-Sep-2017  9:21 AM  Result Value Ref Range Status   Specimen Description PLEURAL RIGHT  Final   Special Requests NONE  Final   Gram Stain   Final    RARE WBC PRESENT, PREDOMINANTLY MONONUCLEAR NO ORGANISMS SEEN Performed at [facility], [address], [place], [place] [address]    Report Status 2017-09-08 FINAL  Final    Radiology Reports Dg Chest 1 View  Result Date: 08-Sep-2017 CLINICAL  DATA:  Thoracentesis. EXAM: CHEST 1 VIEW COMPARISON:  CT 08/31/2016. FINDINGS: Mediastinum and hilar structures are normal. Heart size normal. Atelectatic changes right lung base. Small right pleural effusion. No pneumothorax destructive right humeral lesion best identified on CT. IMPRESSION: 1. Persistent atelectasis/mass right lung base. Reference is made to prior CT report 08/31/2016. 2.  Destructive right humeral lesion best identified on prior CT. Electronically Signed   By: Marcello Moores  Register   On: 08-Sep-2017 09:39   Ct Chest Wo Contrast  Result Date: 2017-09-08 CLINICAL DATA:  Status post thoracentesis. Evaluate right lower lobe process. EXAM: CT CHEST WITHOUT CONTRAST TECHNIQUE: Multidetector CT imaging of the chest was performed following the standard  protocol without IV contrast. COMPARISON:  CT chest, abdomen and pelvis 08/31/2017 FINDINGS: Cardiovascular: The heart is normal in size. No pericardial effusion. The aorta is normal in caliber. No atherosclerotic calcifications. No significant coronary artery calcifications. Mediastinum/Nodes: Mediastinal and right hilar lymphadenopathy. Anterior mediastinal node on image number 40 measures 7 mm. 10 mm pretracheal lymph node on image number 43. 9 mm aorticopulmonary window node on image number 43. 13 mm subcarinal lymph node on the right side on image number 57. The esophagus is grossly normal. Lungs/Pleura: Status post right-sided thoracentesis with a small residual pleural effusion. There is a 6 x 5 cm masslike lesion in the right upper lobe worrisome for neoplasm given the adenopathy and the destructive metastatic bone disease. 4 mm nodule noted at the left lung base on image number 86. Do not see any other obvious pulmonary nodules to suggest pulmonary metastatic disease. Upper Abdomen: No obvious hepatic or adrenal gland metastasis. Contrast in the gallbladder related to vicarious excretion. Musculoskeletal: No obvious breast masses, supraclavicular or  axillary adenopathy. Enlarged multinodular thyroid goiter is noted. Again demonstrated is lytic metastatic disease involving the thoracic vertebral bodies, a few ribs and possibly the sternum. T7 is the most affected but no definite canal encroachment. MRI thoracic spine may be helpful for further evaluation. IMPRESSION: 1. Status post right thoracentesis. 6 x 5 cm masslike lesion in the right lower lobe worrisome for primary lung neoplasm given the mediastinal and right hilar adenopathy and diffuse osseous metastatic disease. Recommend correlation with recent right pleural fluid. PET-CT may be helpful to assess for safest biopsy route. 2. 4 mm left lower lobe pulmonary nodules but no other definite pulmonary lesions. 3. No definite upper abdominal metastatic disease. 4. Multinodular thyroid goiter. 5. No obvious breast masses. Electronically Signed   By: Marijo Sanes M.D.   On: 09/15/2017 16:27   Ct Angio Chest Pe W And/or Wo Contrast  Result Date: 08/31/2017 CLINICAL DATA:  Vomiting, chills since this morning. Right-sided body pain. Pulmonary embolism suspected, high pretest probability. EXAM: CT ANGIOGRAPHY CHEST CT ABDOMEN AND PELVIS WITH CONTRAST TECHNIQUE: Multidetector CT imaging of the chest was performed using the standard protocol during bolus administration of intravenous contrast. Multiplanar CT image reconstructions and MIPs were obtained to evaluate the vascular anatomy. Multidetector CT imaging of the abdomen and pelvis was performed using the standard protocol during bolus administration of intravenous contrast. CONTRAST:  196mL ISOVUE-370 IOPAMIDOL (ISOVUE-370) INJECTION 76% COMPARISON:  None. FINDINGS: CTA CHEST FINDINGS Cardiovascular: Some of the most peripheral segmental and subsegmental pulmonary arteries are difficult to definitively characterize due to mild patient breathing motion artifact, however, there is no pulmonary embolism identified within the main, lobar or central segmental  pulmonary arteries bilaterally. Heart size is normal. No pericardial effusion. Thoracic aorta is normal in caliber and configuration. No aneurysm or evidence of dissection. Mediastinum/Nodes: Scattered small and mildly prominent lymph nodes within the mediastinum and right hilum. Esophagus appears normal. Trachea and central bronchi are unremarkable. Lungs/Pleura: Right pleural effusion, moderate to large in size, with adjacent compressive atelectasis. Associated edema within the right upper lobe and right lower lobe. Left lung is clear. Musculoskeletal: Destructive changes of the proximal right humerus, incompletely imaged. Additional lytic foci within the T6 and T7 vertebral bodies, consistent with metastases. Probable additional lytic lesion within the medial aspects of the left tenth rib. Review of the MIP images confirms the above findings. CT ABDOMEN and PELVIS FINDINGS Hepatobiliary: Tiny hypodense foci within the left liver lobe, much too small  to definitively characterize, most likely small cysts. Gallbladder appears normal. No bile duct dilatation. Pancreas: Unremarkable. No pancreatic ductal dilatation or surrounding inflammatory changes. Spleen: Normal in size without focal abnormality. Adrenals/Urinary Tract: Adrenal glands are unremarkable. Kidneys are normal, without renal calculi, focal lesion, or hydronephrosis. Bladder is unremarkable. Stomach/Bowel: Bowel is normal in caliber. No bowel wall thickening or evidence of bowel wall inflammation. Appendix is normal. Stomach is unremarkable, partially decompressed. Vascular/Lymphatic: No significant vascular findings are present. No enlarged abdominal or pelvic lymph nodes. Reproductive: Status post hysterectomy. No adnexal masses. Other: No free fluid or abscess collection. No free intraperitoneal air. No soft tissue mass seen. Musculoskeletal: Subtle small lucency within the right inferior pubic ramus, suspicious for additional metastasis. Similar  lytic lesion within the upper sacrum, near the midline, again suspicious for metastasis. More convincing metastases are seen within the L4 vertebral body and right iliac bone. Review of the MIP images confirms the above findings. IMPRESSION: 1. Right pleural effusion, moderate to large in size, with adjacent compressive atelectasis. Associated pulmonary edema within the right upper lobe and right lower lobe. Left lung is clear. 2. Heterogeneous low-density areas within the atelectasis at the right lung base, suspicious for neoplastic mass obscured by the surrounding atelectasis, a possible primary carcinoma for the osseous metastases described below. 3. No pulmonary embolism. 4. Destructive changes of the proximal right humerus, almost certainly neoplastic, metastatic versus primary carcinoma, incompletely imaged. Additional metastases identified within the thoracic spine, lumbar spine and osseous pelvis, as detailed above. 5. Mild lymphadenopathy within the mediastinum and right hilum, most likely reactive, but metastatic lymphadenopathy certainly not excluded given the osseous metastases described above. 6. No acute intra-abdominal or intrapelvic abnormality. No bowel obstruction or evidence of bowel wall inflammation. No evidence of acute solid organ abnormality. Appendix is normal. These results were called by telephone at the time of interpretation on 08/31/2017 at 8:14 pm to Dr. Blanchie Dessert , who verbally acknowledged these results. Electronically Signed   By: Franki Cabot M.D.   On: 08/31/2017 20:14   Mr Jeri Cos VV Contrast  Result Date: 09/02/2017 CLINICAL DATA:  Neck pain. Possible thoracic neoplasm. Pleural effusion. Lytic bone lesions. Intractable nausea and vomiting. EXAM: MRI HEAD WITHOUT AND WITH CONTRAST MRI CERVICAL SPINE WITHOUT AND WITH CONTRAST TECHNIQUE: Multiplanar, multiecho pulse sequences of the brain and surrounding structures, and cervical spine, to include the craniocervical  junction and cervicothoracic junction, were obtained without and with intravenous contrast. CONTRAST:  52mL MULTIHANCE GADOBENATE DIMEGLUMINE 529 MG/ML IV SOLN COMPARISON:  CT chest 08/31/2017. FINDINGS: MRI HEAD FINDINGS Brain: No restricted diffusion, or hemorrhage.  No intra-axial mass. Communicating hydrocephalus, with mild enlargement of the lateral, third, and fourth ventricles. Abnormal FLAIR signal in the CSF over the convexity, RIGHT greater than LEFT, is accompanied by abnormal enhancement in the subarachnoid space, consistent with leptomeningeal spread of tumor. Reference image 43 series 23; similar findings in the posterior fossa are less impressive, for instance on image 11 series 23. There is mild superimposed atrophy. There is mild chronic microvascular ischemic change in addition to transependymal absorption. No midline shift. No extra-axial fluid. No definite discrete parenchymal metastases although post infusion imaging is mildly motion degraded. Vascular: Flow voids are maintained. Skull and upper cervical spine: Normal marrow signal. Sinuses/Orbits: Slight layering fluid in the sphenoid. Negative orbits. Other: None. MRI CERVICAL SPINE FINDINGS Alignment: Anatomic. Vertebrae: No worrisome osseous lesion. Cord: Normal signal and morphology. No abnormal enhancement of the cord or surrounding meninges. Posterior Fossa, vertebral  arteries, paraspinal tissues: Vertebral arteries are patent. LEFT greater than RIGHT thyroid gland enlargement, likely goiter. Consider CT neck with contrast for further characterization. Abnormal enhancement in the posterior fossa, particularly surrounding the LEFT cerebellar tonsil. Disc levels: No disc protrusion or spinal stenosis. 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 This is most prominent over the cerebral convexity on the RIGHT. See discussion above. No definite parenchymal metastases, increased  pressure, or midline shift. Unremarkable cervical spine MRI imaging. Electronically Signed   By: Staci Righter M.D.   On: 09/02/2017 14:50   Mr Cervical Spine W Wo Contrast  Result Date: 09/02/2017 CLINICAL DATA:  Neck pain. Possible thoracic neoplasm. Pleural effusion. Lytic bone lesions. Intractable nausea and vomiting. EXAM: MRI HEAD WITHOUT AND WITH CONTRAST MRI CERVICAL SPINE WITHOUT AND WITH CONTRAST TECHNIQUE: Multiplanar, multiecho pulse sequences of the brain and surrounding structures, and cervical spine, to include the craniocervical junction and cervicothoracic junction, were obtained without and with intravenous contrast. CONTRAST:  60mL MULTIHANCE GADOBENATE DIMEGLUMINE 529 MG/ML IV SOLN COMPARISON:  CT chest 08/31/2017. FINDINGS: MRI HEAD FINDINGS Brain: No restricted diffusion, or hemorrhage.  No intra-axial mass. Communicating hydrocephalus, with mild enlargement of the lateral, third, and fourth ventricles. Abnormal FLAIR signal in the CSF over the convexity, RIGHT greater than LEFT, is accompanied by abnormal enhancement in the subarachnoid space, consistent with leptomeningeal spread of tumor. Reference image 43 series 23; similar findings in the posterior fossa are less impressive, for instance on image 11 series 23. There is mild superimposed atrophy. There is mild chronic microvascular ischemic change in addition to transependymal absorption. No midline shift. No extra-axial fluid. No definite discrete parenchymal metastases although post infusion imaging is mildly motion degraded. Vascular: Flow voids are maintained. Skull and upper cervical spine: Normal marrow signal. Sinuses/Orbits: Slight layering fluid in the sphenoid. Negative orbits. Other: None. MRI CERVICAL SPINE FINDINGS Alignment: Anatomic. Vertebrae: No worrisome osseous lesion. Cord: Normal signal and morphology. No abnormal enhancement of the cord or surrounding meninges. Posterior Fossa, vertebral arteries, paraspinal  tissues: Vertebral arteries are patent. LEFT greater than RIGHT thyroid gland enlargement, likely goiter. Consider CT neck with contrast for further characterization. Abnormal enhancement in the posterior fossa, particularly surrounding the LEFT cerebellar tonsil. Disc levels: No disc protrusion or spinal stenosis. 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 This is most prominent over the cerebral convexity on the RIGHT. See discussion above. No definite parenchymal metastases, increased pressure, or midline shift. Unremarkable cervical spine MRI imaging. Electronically Signed   By: Staci Righter M.D.   On: 09/02/2017 14:50   Ct Abdomen Pelvis W Contrast  Result Date: 08/31/2017 CLINICAL DATA:  Vomiting, chills since this morning. Right-sided body pain. Pulmonary embolism suspected, high pretest probability. EXAM: CT ANGIOGRAPHY CHEST CT ABDOMEN AND PELVIS WITH CONTRAST TECHNIQUE: Multidetector CT imaging of the chest was performed using the standard protocol during bolus administration of intravenous contrast. Multiplanar CT image reconstructions and MIPs were obtained to evaluate the vascular anatomy. Multidetector CT imaging of the abdomen and pelvis was performed using the standard protocol during bolus administration of intravenous contrast. CONTRAST:  166mL ISOVUE-370 IOPAMIDOL (ISOVUE-370) INJECTION 76% COMPARISON:  None. FINDINGS: CTA CHEST FINDINGS Cardiovascular: Some of the most peripheral segmental and subsegmental pulmonary arteries are difficult to definitively characterize due to mild patient breathing motion artifact, however, there is no pulmonary embolism identified within the main, lobar or central segmental pulmonary arteries bilaterally. Heart size is normal. No pericardial effusion. Thoracic aorta  is normal in caliber and configuration. No aneurysm or evidence of dissection. Mediastinum/Nodes: Scattered small and mildly prominent  lymph nodes within the mediastinum and right hilum. Esophagus appears normal. Trachea and central bronchi are unremarkable. Lungs/Pleura: Right pleural effusion, moderate to large in size, with adjacent compressive atelectasis. Associated edema within the right upper lobe and right lower lobe. Left lung is clear. Musculoskeletal: Destructive changes of the proximal right humerus, incompletely imaged. Additional lytic foci within the T6 and T7 vertebral bodies, consistent with metastases. Probable additional lytic lesion within the medial aspects of the left tenth rib. Review of the MIP images confirms the above findings. CT ABDOMEN and PELVIS FINDINGS Hepatobiliary: Tiny hypodense foci within the left liver lobe, much too small to definitively characterize, most likely small cysts. Gallbladder appears normal. No bile duct dilatation. Pancreas: Unremarkable. No pancreatic ductal dilatation or surrounding inflammatory changes. Spleen: Normal in size without focal abnormality. Adrenals/Urinary Tract: Adrenal glands are unremarkable. Kidneys are normal, without renal calculi, focal lesion, or hydronephrosis. Bladder is unremarkable. Stomach/Bowel: Bowel is normal in caliber. No bowel wall thickening or evidence of bowel wall inflammation. Appendix is normal. Stomach is unremarkable, partially decompressed. Vascular/Lymphatic: No significant vascular findings are present. No enlarged abdominal or pelvic lymph nodes. Reproductive: Status post hysterectomy. No adnexal masses. Other: No free fluid or abscess collection. No free intraperitoneal air. No soft tissue mass seen. Musculoskeletal: Subtle small lucency within the right inferior pubic ramus, suspicious for additional metastasis. Similar lytic lesion within the upper sacrum, near the midline, again suspicious for metastasis. More convincing metastases are seen within the L4 vertebral body and right iliac bone. Review of the MIP images confirms the above findings.  IMPRESSION: 1. Right pleural effusion, moderate to large in size, with adjacent compressive atelectasis. Associated pulmonary edema within the right upper lobe and right lower lobe. Left lung is clear. 2. Heterogeneous low-density areas within the atelectasis at the right lung base, suspicious for neoplastic mass obscured by the surrounding atelectasis, a possible primary carcinoma for the osseous metastases described below. 3. No pulmonary embolism. 4. Destructive changes of the proximal right humerus, almost certainly neoplastic, metastatic versus primary carcinoma, incompletely imaged. Additional metastases identified within the thoracic spine, lumbar spine and osseous pelvis, as detailed above. 5. Mild lymphadenopathy within the mediastinum and right hilum, most likely reactive, but metastatic lymphadenopathy certainly not excluded given the osseous metastases described above. 6. No acute intra-abdominal or intrapelvic abnormality. No bowel obstruction or evidence of bowel wall inflammation. No evidence of acute solid organ abnormality. Appendix is normal. These results were called by telephone at the time of interpretation on 08/31/2017 at 8:14 pm to Dr. Blanchie Dessert , who verbally acknowledged these results. Electronically Signed   By: Franki Cabot M.D.   On: 08/31/2017 20:14   Ir Thoracentesis Asp Pleural Space W/img Guide  Result Date: 2017/09/24 INDICATION: Patient with a right humeral lytic lesion along with a right lung lesion and a right pleural effusion. Request is made for diagnostic and therapeutic thoracentesis. EXAM: ULTRASOUND GUIDED DIAGNOSTIC AND THERAPEUTIC THORACENTESIS MEDICATIONS: 2% lidocaine COMPLICATIONS: None immediate. PROCEDURE: An ultrasound guided thoracentesis was thoroughly discussed with the patient and questions answered. The benefits, risks, alternatives and complications were also discussed. The patient understands and wishes to proceed with the procedure. Written consent  was obtained. Ultrasound was performed to localize and mark an adequate pocket of fluid in the right chest. The area was then prepped and draped in the normal sterile fashion. 2% Lidocaine was used for  local anesthesia. Under ultrasound guidance a Safe-T-Centesis catheter was introduced. Thoracentesis was performed. The catheter was removed and a dressing applied. FINDINGS: A total of approximately 0.45 L of serous fluid was removed. Samples were sent to the laboratory as requested by the clinical team. IMPRESSION: Successful ultrasound guided right thoracentesis yielding 0.45 L of pleural fluid. Read by: Saverio Danker, PA-C Electronically Signed   By: Sandi Mariscal M.D.   On: 07-Sep-2017 09:58     CBC Recent Labs  Lab 08/31/17 1446 September 07, 2017 0551 09/02/17 0432 09/03/17 0015  WBC 7.9 6.3 5.6 5.4  HGB 13.1 12.3 12.2 12.6  HCT 39.1 37.5 37.9 38.0  PLT 229 223 207 230  MCV 88.3 88.4 89.6 87.4  MCH 29.6 29.0 28.8 29.0  MCHC 33.5 32.8 32.2 33.2  RDW 13.5 13.8 14.0 13.4    Chemistries  Recent Labs  Lab 08/31/17 1446 09/02/17 0432  NA 136 136  K 4.1 4.6  CL 103 103  CO2 20* 21*  GLUCOSE 115* 111*  BUN 12 14  CREATININE 0.79 0.80  CALCIUM 9.6 9.2  AST 20  --   ALT 14  --   ALKPHOS 107  --   BILITOT 0.6  --    ------------------------------------------------------------------------------------------------------------------ No results for input(s): CHOL, HDL, LDLCALC, TRIG, CHOLHDL, LDLDIRECT in the last 72 hours.  No results found for: HGBA1C ------------------------------------------------------------------------------------------------------------------ No results for input(s): TSH, T4TOTAL, T3FREE, THYROIDAB in the last 72 hours.  Invalid input(s): FREET3 ------------------------------------------------------------------------------------------------------------------ No results for input(s): VITAMINB12, FOLATE, FERRITIN, TIBC, IRON, RETICCTPCT in the last 72  hours.  Coagulation profile No results for input(s): INR, PROTIME in the last 168 hours.  No results for input(s): DDIMER in the last 72 hours.  Cardiac Enzymes No results for input(s): CKMB, TROPONINI, MYOGLOBIN in the last 168 hours.  Invalid input(s): CK ------------------------------------------------------------------------------------------------------------------ No results found for: BNP   Roxan Hockey M.D on 09/03/2017 at 5:03 PM  Between 7am to 7pm - Pager - [phone]  After 7pm go to [email or web address] - password [email or web address]  [facility] -  Office  [phone]   Voice Recognition Viviann Spare dictation system was used to create this note, attempts have been made to correct errors. Please contact the author with questions and/or clarifications.

## 2017-09-03 NOTE — Progress Notes (Signed)
ANTICOAGULATION CONSULT NOTE - Follow Up Consult  Pharmacy Consult for Heparin Indication: DVT  No Known Allergies  Patient Measurements: Height: 5\' 1"  (154.9 cm) Weight: 156 lb 1.4 oz (70.8 kg) IBW/kg (Calculated) : 47.8 Heparin Dosing Weight:   Vital Signs: Temp: 98 F (36.7 C) (02/02 2129) Temp Source: Oral (02/02 2129) BP: 141/90 (02/02 2129) Pulse Rate: 51 (02/02 2129)  Labs: Recent Labs    08/31/17 1446 09/23/2017 0551  09-23-17 2054 09/02/17 0432 09/02/17 1431 09/03/17 0015  HGB 13.1 12.3  --   --  12.2  --  12.6  HCT 39.1 37.5  --   --  37.9  --  38.0  PLT 229 223  --   --  207  --  230  APTT  --   --   --  57* 114* 122*  --   HEPARINUNFRC  --   --    < > 0.41 0.95* 1.12* 0.76*  CREATININE 0.79  --   --   --  0.80  --   --    < > = values in this interval not displayed.    Estimated Creatinine Clearance: 59.7 mL/min (by C-G formula based on SCr of 0.8 mg/dL).   Medications:  Infusions:  . sodium chloride    . heparin 900 Units/hr (09/02/17 1610)    Assessment: Patient with high heparin level.  No heparin issues, but RN did say he stopped heparin for level to be drawn.  Goal of Therapy:  Heparin level 0.3-0.7 units/ml Monitor platelets by anticoagulation protocol: Yes   Plan:  Decrease heparin to 750  Units/hr Recheck level at [place], [place] 09/03/2017,1:51 AM

## 2017-09-03 NOTE — Progress Notes (Signed)
PT Note  Patient Details Name: Samantha [name] MRN: [id number] DOB: Nov 04, 1945    Noticed pt had a PT EVAL ordered prior to transferring from [facility] to [facility]. We have not been able to assess pt yet due to transfer and other medical issues as noted with chart review.   Will check on pt tomorrow, please advise if medical team feels otherwise.  Thank you,   Clide Dales 09/03/2017, 5:13 PM  Clide Dales, PT Pager: [phone] 09/03/2017

## 2017-09-04 ENCOUNTER — Inpatient Hospital Stay (HOSPITAL_COMMUNITY): Payer: Medicare HMO

## 2017-09-04 ENCOUNTER — Other Ambulatory Visit: Payer: Self-pay | Admitting: Radiation Oncology

## 2017-09-04 ENCOUNTER — Ambulatory Visit
Admit: 2017-09-04 | Discharge: 2017-09-04 | Disposition: A | Discharge status: Home / Self Care | Payer: Medicare HMO | Attending: Radiation Oncology | Admitting: Radiation Oncology

## 2017-09-04 DIAGNOSIS — C7949 Secondary malignant neoplasm of other parts of nervous system: Secondary | ICD-10-CM

## 2017-09-04 DIAGNOSIS — R51 Headache: Secondary | ICD-10-CM

## 2017-09-04 DIAGNOSIS — R918 Other nonspecific abnormal finding of lung field: Secondary | ICD-10-CM

## 2017-09-04 DIAGNOSIS — G9619 Other disorders of meninges, not elsewhere classified: Secondary | ICD-10-CM

## 2017-09-04 LAB — HEPARIN LEVEL (UNFRACTIONATED): Heparin Unfractionated: 0.25 IU/mL — ABNORMAL LOW (ref 0.30–0.70)

## 2017-09-04 LAB — URINALYSIS, ROUTINE W REFLEX MICROSCOPIC
Bilirubin Urine: NEGATIVE
GLUCOSE, UA: NEGATIVE mg/dL
Hgb urine dipstick: NEGATIVE
Ketones, ur: NEGATIVE mg/dL
NITRITE: NEGATIVE
PROTEIN: NEGATIVE mg/dL
Specific Gravity, Urine: 1.015 (ref 1.005–1.030)
pH: 7 (ref 5.0–8.0)

## 2017-09-04 LAB — CBC
HCT: 37.3 % (ref 36.0–46.0)
Hemoglobin: 12.4 g/dL (ref 12.0–15.0)
MCH: 29.1 pg (ref 26.0–34.0)
MCHC: 33.2 g/dL (ref 30.0–36.0)
MCV: 87.6 fL (ref 78.0–100.0)
PLATELETS: 263 10*3/uL (ref 150–400)
RBC: 4.26 MIL/uL (ref 3.87–5.11)
RDW: 13.6 % (ref 11.5–15.5)
WBC: 12.5 10*3/uL — AB (ref 4.0–10.5)

## 2017-09-04 MED ORDER — GADOBENATE DIMEGLUMINE 529 MG/ML IV SOLN
15.0000 mL | Freq: Once | INTRAVENOUS | Status: AC | PRN
  Administered 2017-09-04: 15 mL via INTRAVENOUS

## 2017-09-04 MED ORDER — LIDOCAINE HCL 1 % IJ SOLN
INTRAMUSCULAR | Status: AC
  Administered 2017-09-04: 3 mL via INTRADERMAL
  Filled 2017-09-04: qty 20

## 2017-09-04 MED ORDER — BISACODYL 10 MG RE SUPP
10.0000 mg | Freq: Once | RECTAL | Status: DC
  Filled 2017-09-04: qty 1

## 2017-09-04 MED ORDER — HEPARIN (PORCINE) IN NACL 100-0.45 UNIT/ML-% IJ SOLN
750.0000 [IU]/h | INTRAMUSCULAR | Status: DC
  Administered 2017-09-04 – 2017-09-08 (×2): 750 [IU]/h via INTRAVENOUS
  Filled 2017-09-04 (×5): qty 250

## 2017-09-04 NOTE — Progress Notes (Signed)
Heparin stopped at 08:29 today, per Dr Denton Brick. Pt to go for lumbar puncture at noon today, she is NPO.

## 2017-09-04 NOTE — Progress Notes (Signed)
ANTICOAGULATION CONSULT NOTE - Follow Up Consult  Pharmacy Consult for Heparin Indication: DVT  No Known Allergies  Patient Measurements: Height: 5\' 1"  (154.9 cm) Weight: 156 lb 1.4 oz (70.8 kg) IBW/kg (Calculated) : 47.8 Heparin Dosing Weight:   Vital Signs: Temp: 98.8 F (37.1 C) (02/03 2031) Temp Source: Oral (02/03 2031) BP: 139/77 (02/03 2031) Pulse Rate: 86 (02/03 2031)  Labs: Recent Labs    09/19/2017 2054 09/02/17 0432 09/02/17 1431 09/03/17 0015 09/03/17 1027 09/03/17 1820 09/04/17 0001  HGB  --  12.2  --  12.6  --   --  12.4  HCT  --  37.9  --  38.0  --   --  37.3  PLT  --  207  --  230  --   --  263  APTT 57* 114* 122*  --   --   --   --   HEPARINUNFRC 0.41 0.95* 1.12* 0.76* 0.71* 0.41 0.25*  CREATININE  --  0.80  --   --   --   --   --     Estimated Creatinine Clearance: 59.7 mL/min (by C-G formula based on SCr of 0.8 mg/dL).   Medications:  Infusions:  . sodium chloride    . heparin 750 Units/hr (09/04/17 0258)    Assessment: Patient with heparin level not below goal.  No heparin issues per RN.  Goal of Therapy:  Heparin level 0.3-0.7 units/ml Monitor platelets by anticoagulation protocol: Yes   Plan:  Increase heparin to 750 units/hr Recheck level at [address] [place] 09/04/2017,3:01 AM

## 2017-09-04 NOTE — Progress Notes (Addendum)
Pt returned from Lumbar puncture and will remain flat for 4 hours. After 1900 she may sit up and eat. Band aid on mid spine remains dry/intact. Appetite good. Family at beside. She is in good spirits. Heparin infusion to restart at 2000.

## 2017-09-04 NOTE — Progress Notes (Signed)
IP PROGRESS NOTE  Subjective:  No new events overnight.  Patient has no new complaints.  No recurrence of headaches.  Has not had lumbar puncture or additional MRI imaging yet  Objective: Vital signs in last 24 hours: Blood pressure 137/86, pulse 83, temperature 97.8 F (36.6 C), temperature source Oral, resp. rate 16, height 5\' 1"  (1.549 m), weight 156 lb 1.4 oz (70.8 kg), SpO2 96 %.  Intake/Output from previous day: 02/03 0701 - 02/04 0700 In: 831.5 [P.O.:800; I.V.:31.5] Out: -   Physical Exam:  Alert, awake, oriented x3.  No acute distress other than pain in the right shoulder HEENT: Anicteric sclera, moist mucous membranes.  PERRLA, EOMI. Lungs: Clear to auscultation bilaterally, no expiratory wheezing Cardiac: S1/S2, regular.  No murmurs/rubs/gallops Abdomen: Soft, nontender, nondistended.  Bowel sounds are normoactive Extremities: No lower extremity edema Neuro: No gross focal neurological deficits   Lab Results: Recent Labs    09/03/17 0015 09/04/17 0001  WBC 5.4 12.5*  HGB 12.6 12.4  HCT 38.0 37.3  PLT 230 263    BMET Recent Labs    09/02/17 0432  NA 136  K 4.6  CL 103  CO2 21*  GLUCOSE 111*  BUN 14  CREATININE 0.80  CALCIUM 9.2    No results found for: CEA1  Studies/Results: Mr Jeri Cos LF Contrast  Result Date: 09/02/2017 CLINICAL DATA:  Neck pain. Possible thoracic neoplasm. Pleural effusion. Lytic bone lesions. Intractable nausea and vomiting. EXAM: MRI HEAD WITHOUT AND WITH CONTRAST MRI CERVICAL SPINE WITHOUT AND WITH CONTRAST TECHNIQUE: Multiplanar, multiecho pulse sequences of the brain and surrounding structures, and cervical spine, to include the craniocervical junction and cervicothoracic junction, were obtained without and with intravenous contrast. CONTRAST:  62mL MULTIHANCE GADOBENATE DIMEGLUMINE 529 MG/ML IV SOLN COMPARISON:  CT chest 08/31/2017. FINDINGS: MRI HEAD FINDINGS Brain: No restricted diffusion, or hemorrhage.  No intra-axial mass.  Communicating hydrocephalus, with mild enlargement of the lateral, third, and fourth ventricles. Abnormal FLAIR signal in the CSF over the convexity, RIGHT greater than LEFT, is accompanied by abnormal enhancement in the subarachnoid space, consistent with leptomeningeal spread of tumor. Reference image 43 series 23; similar findings in the posterior fossa are less impressive, for instance on image 11 series 23. There is mild superimposed atrophy. There is mild chronic microvascular ischemic change in addition to transependymal absorption. No midline shift. No extra-axial fluid. No definite discrete parenchymal metastases although post infusion imaging is mildly motion degraded. Vascular: Flow voids are maintained. Skull and upper cervical spine: Normal marrow signal. Sinuses/Orbits: Slight layering fluid in the sphenoid. Negative orbits. Other: None. MRI CERVICAL SPINE FINDINGS Alignment: Anatomic. Vertebrae: No worrisome osseous lesion. Cord: Normal signal and morphology. No abnormal enhancement of the cord or surrounding meninges. Posterior Fossa, vertebral arteries, paraspinal tissues: Vertebral arteries are patent. LEFT greater than RIGHT thyroid gland enlargement, likely goiter. Consider CT neck with contrast for further characterization. Abnormal enhancement in the posterior fossa, particularly surrounding the LEFT cerebellar tonsil. Disc levels: No disc protrusion or spinal stenosis. 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 This is most prominent over the cerebral convexity on the RIGHT. See discussion above. No definite parenchymal metastases, increased pressure, or midline shift. Unremarkable cervical spine MRI imaging. Electronically Signed   By: Staci Righter M.D.   On: 09/02/2017 14:50   Mr Cervical Spine W Wo Contrast  Result Date: 09/02/2017 CLINICAL DATA:  Neck pain. Possible thoracic neoplasm. Pleural effusion. Lytic bone lesions.  Intractable nausea and vomiting. EXAM:  MRI HEAD WITHOUT AND WITH CONTRAST MRI CERVICAL SPINE WITHOUT AND WITH CONTRAST TECHNIQUE: Multiplanar, multiecho pulse sequences of the brain and surrounding structures, and cervical spine, to include the craniocervical junction and cervicothoracic junction, were obtained without and with intravenous contrast. CONTRAST:  40mL MULTIHANCE GADOBENATE DIMEGLUMINE 529 MG/ML IV SOLN COMPARISON:  CT chest 08/31/2017. FINDINGS: MRI HEAD FINDINGS Brain: No restricted diffusion, or hemorrhage.  No intra-axial mass. Communicating hydrocephalus, with mild enlargement of the lateral, third, and fourth ventricles. Abnormal FLAIR signal in the CSF over the convexity, RIGHT greater than LEFT, is accompanied by abnormal enhancement in the subarachnoid space, consistent with leptomeningeal spread of tumor. Reference image 43 series 23; similar findings in the posterior fossa are less impressive, for instance on image 11 series 23. There is mild superimposed atrophy. There is mild chronic microvascular ischemic change in addition to transependymal absorption. No midline shift. No extra-axial fluid. No definite discrete parenchymal metastases although post infusion imaging is mildly motion degraded. Vascular: Flow voids are maintained. Skull and upper cervical spine: Normal marrow signal. Sinuses/Orbits: Slight layering fluid in the sphenoid. Negative orbits. Other: None. MRI CERVICAL SPINE FINDINGS Alignment: Anatomic. Vertebrae: No worrisome osseous lesion. Cord: Normal signal and morphology. No abnormal enhancement of the cord or surrounding meninges. Posterior Fossa, vertebral arteries, paraspinal tissues: Vertebral arteries are patent. LEFT greater than RIGHT thyroid gland enlargement, likely goiter. Consider CT neck with contrast for further characterization. Abnormal enhancement in the posterior fossa, particularly surrounding the LEFT cerebellar tonsil. Disc levels: No disc protrusion or  spinal stenosis. 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 This is most prominent over the cerebral convexity on the RIGHT. See discussion above. No definite parenchymal metastases, increased pressure, or midline shift. Unremarkable cervical spine MRI imaging. Electronically Signed   By: Staci Righter M.D.   On: 09/02/2017 14:50    Medications: I have reviewed the patient's current medications.  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  No focal CNS disease at this time.  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    Headache responded well to steroid initiation.  No new neurological symptoms today.  Recommendations: -Recommend MRI of the thoracic and lumbar spine with and without contrast to complete spinal imaging for possible additional foci of leptomeningeal disease -Agree with neurosurgery recommendation for lumbar puncture with diagnostic purpose: Include opening pressure, or biochemical measurements as well as cytology -Continue dexamethasone 8 mg every 8 hours -Radiation oncology was consulted and we are awaiting their opinion regarding the management of leptomeningeal disease - Our service will continue following patient and will update our recommendations once pathological information becomes available from thoracentesis obtained on 09-16-17.   LOS: 4 days   Ardath Sax, MD   09/04/2017, 8:21 AM

## 2017-09-04 NOTE — Consult Note (Signed)
 Radiation Oncology         [phone] ________________________________  Initial inpatient Consultation  Name: Samantha [name] MRN: [id number]  Date of Service: 09/05/17 DOB: February 12, 1946  [id number], No Pcp Per  No ref. provider found   REFERRING PHYSICIAN: Dr. Lebron Conners  DIAGNOSIS: The primary encounter diagnosis was Lytic bone lesions on xray. 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    ICD-10-CM   1. Lytic bone lesions on xray M89.9   2. Pleural effusion on right J90   3. Pleural effusion J90 IR THORACENTESIS ASP PLEURAL SPACE W/IMG GUIDE    IR THORACENTESIS ASP PLEURAL SPACE W/IMG GUIDE  4. S/P thoracentesis Z98.890 DG Chest 1 View    DG Chest 1 View  5. 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 Metastasis ([facility]CC) C79.9 DG FLUORO GUIDE LUMBAR PUNCTURE    DG FLUORO GUIDE LUMBAR PUNCTURE    HISTORY OF PRESENT ILLNESS: Samantha [name] is a 72 y.o. female seen at the request of Dr. Lebron Conners for a probable metastatic lung cancer. The patient was noted to have pain in her right shoulder for 2 weeks and was seen in the ED on 07/29/17 and found to have a lytic lesion in her right humerus as well as lower extremity DVTs. She was referred for work up and was seen again in the ED on 08/31/17 with increasing shoulder pain, shortness of breath, and nausea with vomiting. Her work up with CT PA, and A/P revealed no embolism. Destructive changes were noted in the proximal right humerus,a nd within the thoracic and lumbar spine and pelvis. She has mediastinal and right hilar adenopathy and a large right pleural effusion with associated pulmonary edema within the right upper and lower lobes. She underwent thoracentesis on 09-17-2017, and 450 mL was removed and is pending cytology. Her post-thoracentesis CT imaging on Sep 17, 2017 revealed a 6 x 5 cm left  upper lobe mass with adenopathy in the mediastinum, right hilum, AP window, pretracheal, and subcarinal regions. A 4 mm nodule in the left lung base was also noted. No evidence of breast mass or metastatic disease was noted. She had an MRI of the brain and C-spine on 09/02/17 which revealed concerns for leptomeningeal disease and mild communicated hydrocephalus most prominent over the cerebral convexity on the right. She is seen today to discuss radiotherapy to the brain and to consider palliative treatment of her bony disease. Of note the patient did undergo additional MRI imaging of her complete spine which confirms disease at T6 posteriorly and T4, as well as  confirmed disease at L4, L5 each 2 cm, and L1, L2, L4, and S1, and a 5 mm left ilium lesion. No epidural involvement was noted.     PREVIOUS RADIATION THERAPY: No  PAST MEDICAL HISTORY:  Past Medical History:  Diagnosis Date  . Pneumonia 07/12/2013      PAST SURGICAL HISTORY: Past Surgical History:  Procedure Laterality Date  . ABDOMINAL HYSTERECTOMY     20 years ago, due to fibroids   . IR THORACENTESIS ASP PLEURAL SPACE W/IMG GUIDE  09-17-17    FAMILY HISTORY:  Family History  Problem Relation Age of Onset  . Stroke Mother        deceased  . Diabetes Mellitus II Mother        deceased  . Hypertension Mother   . Prostate cancer Father  SOCIAL HISTORY:  Social History   Socioeconomic History  . Marital status: Married    Spouse name: Not on file  . Number of children: 3  . Years of education: Not on file  . Highest education level: Not on file  Social Needs  . Financial resource strain: Not on file  . Food insecurity - worry: Not on file  . Food insecurity - inability: Not on file  . Transportation needs - medical: Not on file  . Transportation needs - non-medical: Not on file  Occupational History  . Occupation: Event Coordinator    Comment: [employer]  Tobacco Use  . Smoking status: Never  Smoker  . Smokeless tobacco: Never Used  Substance and Sexual Activity  . Alcohol use: No  . Drug use: No  . Sexual activity: Not on file  Other Topics Concern  . Not on file  Social History Narrative   Has a biological son and daughter, also has an adopted son.  She lives with her husband. No pets.     ALLERGIES: Patient has no known allergies.  MEDICATIONS:  Current Facility-Administered Medications  Medication Dose Route Frequency Provider Last Rate Last Dose  . 0.9 %  sodium chloride infusion  250 mL Intravenous PRN Merton Border, MD      . acetaminophen (TYLENOL) tablet 650 mg  650 mg Oral Q6H PRN Merton Border, MD       Or  . acetaminophen (TYLENOL) suppository 650 mg  650 mg Rectal Q6H PRN Merton Border, MD      . albuterol (PROVENTIL) (2.5 MG/3ML) 0.083% nebulizer solution 2.5 mg  2.5 mg Nebulization Q2H PRN Merton Border, MD      . bisacodyl (DULCOLAX) suppository 10 mg  10 mg Rectal Once Emokpae, Courage, MD      . dexamethasone (DECADRON) injection 8 mg  8 mg Intravenous Q8H Ardath Sax, MD   8 mg at 09/03/17 2231  . feeding supplement (ENSURE ENLIVE) (ENSURE ENLIVE) liquid 237 mL  237 mL Oral BID BM Regalado, Belkys A, MD   237 mL at 09/03/17 1204  . lidocaine (XYLOCAINE) 2 % injection   Infiltration PRN Saverio Danker, PA-C   10 mL at 09-18-17 0905  . morphine 4 MG/ML injection 2 mg  2 mg Intravenous Q4H PRN Merton Border, MD   2 mg at 09/02/17 2316  . ondansetron (ZOFRAN) tablet 4 mg  4 mg Oral Q6H PRN Merton Border, MD       Or  . ondansetron (ZOFRAN) injection 4 mg  4 mg Intravenous Q6H PRN Merton Border, MD      . oxyCODONE (Oxy IR/ROXICODONE) immediate release tablet 5 mg  5 mg Oral Q4H PRN Merton Border, MD   5 mg at 09/04/17 0402  . polyethylene glycol (MIRALAX / GLYCOLAX) packet 17 g  17 g Oral BID Regalado, Belkys A, MD   17 g at 09/03/17 2231  . senna (SENOKOT) tablet 8.6 mg  1 tablet Oral Daily Regalado, Belkys A, MD   8.6 mg at 09/03/17 1159  . sodium chloride flush  (NS) 0.9 % injection 3 mL  3 mL Intravenous Q12H Merton Border, MD   3 mL at 09/03/17 1207  . sodium chloride flush (NS) 0.9 % injection 3 mL  3 mL Intravenous PRN Merton Border, MD      . zolpidem (AMBIEN) tablet 5 mg  5 mg Oral QHS PRN Merton Border, MD        REVIEW OF SYSTEMS:  On review of systems, the patient reports that she is doing well overall. She continues to have progressive right arm pain that makes it very difficult to abduct this. She is unable to carry things due to pain as well. She has intact sensation to light touch of the upper extremity on the right. She denies any trunk numbness or pain in her mid back. She denies any hip pain or deep pelvic pain. She continues to have headaches though these are better with dexamethasone. She reports her nausea has also improves any difficulties with visual or auditory changes. She denies any chest pain. She does not have shortness of breath currently but prior to thoracentesis, she noted this as well as a productive cough. She denies hemoptysis. She has had about 20 pounds of unintended weight changes. She denies any bowel or bladder disturbances, and denies abdominal pain, nausea or vomiting. She denies any new musculoskeletal or joint aches or pains. A complete review of systems is obtained and is otherwise negative.    PHYSICAL EXAM:  Wt Readings from Last 3 Encounters:  09/02/17 156 lb 1.4 oz (70.8 kg)  08/07/17 173 lb (78.5 kg)  05/18/16 173 lb (78.5 kg)   Temp Readings from Last 3 Encounters:  09/04/17 97.8 F (36.6 C) (Oral)  08/07/17 98.7 F (37.1 C) (Oral)  07/29/17 98.3 F (36.8 C)   BP Readings from Last 3 Encounters:  09/04/17 137/86  08/07/17 (!) 141/69  07/29/17 (!) 157/83   Pulse Readings from Last 3 Encounters:  09/04/17 83  08/07/17 86  07/29/17 88   Pain Assessment Pain Score: Asleep/10  In general this is a well appearing African  in no acute distress. She is alert and oriented x4 and appropriate throughout the  examination. HEENT reveals that the patient is normocephalic, atraumatic. EOMs are intact. PERRLA. Skin is intact without any evidence of gross lesions. Cardiovascular exam reveals a regular rate and rhythm, no clicks rubs or murmurs are auscultated. Chest is clear to auscultation in the upper fields . Lymphatic assessment is performed and does not reveal any adenopathy in the cervical, supraclavicular, axillary, or inguinal chains. Abdomen has active bowel sounds in all quadrants and is intact. The abdomen is soft, non tender, non distended. Lower extremities are negative for pretibial pitting edema, deep calf tenderness, cyanosis or clubbing.   KPS = 40  100 - Normal; no complaints; no evidence of disease. 90   - Able to carry on normal activity; minor signs or symptoms of disease. 80   - Normal activity with effort; some signs or symptoms of disease. 33   - Cares for self; unable to carry on normal activity or to do active work. 60   - Requires occasional assistance, but is able to care for most of his personal needs. 50   - Requires considerable assistance and frequent medical care. 39   - Disabled; requires special care and assistance. 13   - Severely disabled; hospital admission is indicated although death not imminent. 32   - Very sick; hospital admission necessary; active supportive treatment necessary. 10   - Moribund; fatal processes progressing rapidly. 0     - Dead  Karnofsky DA, Abelmann WH, Craver LS and Burchenal [facility] [phone]) The use of the nitrogen mustards in the palliative treatment of carcinoma: with particular reference to bronchogenic carcinoma Cancer 1 [id number]  LABORATORY DATA:  Lab Results  Component Value Date   WBC 12.5 (H) 09/04/2017   HGB 12.4 09/04/2017   HCT  37.3 09/04/2017   MCV 87.6 09/04/2017   PLT 263 09/04/2017   Lab Results  Component Value Date   NA 136 09/02/2017   K 4.6 09/02/2017   CL 103 09/02/2017   CO2 21 (L) 09/02/2017   Lab Results    Component Value Date   ALT 14 08/31/2017   AST 20 08/31/2017   ALKPHOS 107 08/31/2017   BILITOT 0.6 08/31/2017     RADIOGRAPHY: Dg Chest 1 View  Result Date:  CLINICAL DATA:  Thoracentesis. EXAM: CHEST 1 VIEW COMPARISON:  CT 08/31/2016. FINDINGS: Mediastinum and hilar structures are normal. Heart size normal. Atelectatic changes right lung base. Small right pleural effusion. No pneumothorax destructive right humeral lesion best identified on CT. IMPRESSION: 1. Persistent atelectasis/mass right lung base. Reference is made to prior CT report 08/31/2016. 2.  Destructive right humeral lesion best identified on prior CT. Electronically Signed   By: Marcello Moores  Register   On:  09:39   Ct Chest Wo Contrast  Result Date: 20-Sep-2017 CLINICAL DATA:  Status post thoracentesis. Evaluate right lower lobe process. EXAM: CT CHEST WITHOUT CONTRAST TECHNIQUE: Multidetector CT imaging of the chest was performed following the standard protocol without IV contrast. COMPARISON:  CT chest, abdomen and pelvis 08/31/2017 FINDINGS: Cardiovascular: The heart is normal in size. No pericardial effusion. The aorta is normal in caliber. No atherosclerotic calcifications. No significant coronary artery calcifications. Mediastinum/Nodes: Mediastinal and right hilar lymphadenopathy. Anterior mediastinal node on image number 40 measures 7 mm. 10 mm pretracheal lymph node on image number 43. 9 mm aorticopulmonary window node on image number 43. 13 mm subcarinal lymph node on the right side on image number 57. The esophagus is grossly normal. Lungs/Pleura: Status post right-sided thoracentesis with a small residual pleural effusion. There is a 6 x 5 cm masslike lesion in the right upper lobe worrisome for neoplasm given the adenopathy and the destructive metastatic bone disease. 4 mm nodule noted at the left lung base on image number 86. Do not see any other obvious pulmonary nodules to suggest pulmonary metastatic  disease. Upper Abdomen: No obvious hepatic or adrenal gland metastasis. Contrast in the gallbladder related to vicarious excretion. Musculoskeletal: No obvious breast masses, supraclavicular or axillary adenopathy. Enlarged multinodular thyroid goiter is noted. Again demonstrated is lytic metastatic disease involving the thoracic vertebral bodies, a few ribs and possibly the sternum. T7 is the most affected but no definite canal encroachment. MRI thoracic spine may be helpful for further evaluation. IMPRESSION: 1. Status post right thoracentesis. 6 x 5 cm masslike lesion in the right lower lobe worrisome for primary lung neoplasm given the mediastinal and right hilar adenopathy and diffuse osseous metastatic disease. Recommend correlation with recent right pleural fluid. PET-CT may be helpful to assess for safest biopsy route. 2. 4 mm left lower lobe pulmonary nodules but no other definite pulmonary lesions. 3. No definite upper abdominal metastatic disease. 4. Multinodular thyroid goiter. 5. No obvious breast masses. Electronically Signed   By: Marijo Sanes M.D.   On:  16:27   Ct Angio Chest Pe W And/or Wo Contrast  Result Date: 08/31/2017 CLINICAL DATA:  Vomiting, chills since this morning. Right-sided body pain. Pulmonary embolism suspected, high pretest probability. EXAM: CT ANGIOGRAPHY CHEST CT ABDOMEN AND PELVIS WITH CONTRAST TECHNIQUE: Multidetector CT imaging of the chest was performed using the standard protocol during bolus administration of intravenous contrast. Multiplanar CT image reconstructions and MIPs were obtained to evaluate the vascular anatomy. Multidetector CT imaging of the abdomen and pelvis  was performed using the standard protocol during bolus administration of intravenous contrast. CONTRAST:  135m ISOVUE-370 IOPAMIDOL (ISOVUE-370) INJECTION 76% COMPARISON:  None. FINDINGS: CTA CHEST FINDINGS Cardiovascular: Some of the most peripheral segmental and subsegmental pulmonary  arteries are difficult to definitively characterize due to mild patient breathing motion artifact, however, there is no pulmonary embolism identified within the main, lobar or central segmental pulmonary arteries bilaterally. Heart size is normal. No pericardial effusion. Thoracic aorta is normal in caliber and configuration. No aneurysm or evidence of dissection. Mediastinum/Nodes: Scattered small and mildly prominent lymph nodes within the mediastinum and right hilum. Esophagus appears normal. Trachea and central bronchi are unremarkable. Lungs/Pleura: Right pleural effusion, moderate to large in size, with adjacent compressive atelectasis. Associated edema within the right upper lobe and right lower lobe. Left lung is clear. Musculoskeletal: Destructive changes of the proximal right humerus, incompletely imaged. Additional lytic foci within the T6 and T7 vertebral bodies, consistent with metastases. Probable additional lytic lesion within the medial aspects of the left tenth rib. Review of the MIP images confirms the above findings. CT ABDOMEN and PELVIS FINDINGS Hepatobiliary: Tiny hypodense foci within the left liver lobe, much too small to definitively characterize, most likely small cysts. Gallbladder appears normal. No bile duct dilatation. Pancreas: Unremarkable. No pancreatic ductal dilatation or surrounding inflammatory changes. Spleen: Normal in size without focal abnormality. Adrenals/Urinary Tract: Adrenal glands are unremarkable. Kidneys are normal, without renal calculi, focal lesion, or hydronephrosis. Bladder is unremarkable. Stomach/Bowel: Bowel is normal in caliber. No bowel wall thickening or evidence of bowel wall inflammation. Appendix is normal. Stomach is unremarkable, partially decompressed. Vascular/Lymphatic: No significant vascular findings are present. No enlarged abdominal or pelvic lymph nodes. Reproductive: Status post hysterectomy. No adnexal masses. Other: No free fluid or abscess  collection. No free intraperitoneal air. No soft tissue mass seen. Musculoskeletal: Subtle small lucency within the right inferior pubic ramus, suspicious for additional metastasis. Similar lytic lesion within the upper sacrum, near the midline, again suspicious for metastasis. More convincing metastases are seen within the L4 vertebral body and right iliac bone. Review of the MIP images confirms the above findings. IMPRESSION: 1. Right pleural effusion, moderate to large in size, with adjacent compressive atelectasis. Associated pulmonary edema within the right upper lobe and right lower lobe. Left lung is clear. 2. Heterogeneous low-density areas within the atelectasis at the right lung base, suspicious for neoplastic mass obscured by the surrounding atelectasis, a possible primary carcinoma for the osseous metastases described below. 3. No pulmonary embolism. 4. Destructive changes of the proximal right humerus, almost certainly neoplastic, metastatic versus primary carcinoma, incompletely imaged. Additional metastases identified within the thoracic spine, lumbar spine and osseous pelvis, as detailed above. 5. Mild lymphadenopathy within the mediastinum and right hilum, most likely reactive, but metastatic lymphadenopathy certainly not excluded given the osseous metastases described above. 6. No acute intra-abdominal or intrapelvic abnormality. No bowel obstruction or evidence of bowel wall inflammation. No evidence of acute solid organ abnormality. Appendix is normal. These results were called by telephone at the time of interpretation on 08/31/2017 at 8:14 pm to Dr. WBlanchie Dessert, who verbally acknowledged these results. Electronically Signed   By: SFranki CabotM.D.   On: 08/31/2017 20:14   Mr BJeri CosWAXContrast  Result Date: 09/02/2017 CLINICAL DATA:  Neck pain. Possible thoracic neoplasm. Pleural effusion. Lytic bone lesions. Intractable nausea and vomiting. EXAM: MRI HEAD WITHOUT AND WITH CONTRAST  MRI CERVICAL SPINE WITHOUT AND WITH CONTRAST TECHNIQUE: Multiplanar, multiecho pulse sequences  of the brain and surrounding structures, and cervical spine, to include the craniocervical junction and cervicothoracic junction, were obtained without and with intravenous contrast. CONTRAST:  73m MULTIHANCE GADOBENATE DIMEGLUMINE 529 MG/ML IV SOLN COMPARISON:  CT chest 08/31/2017. FINDINGS: MRI HEAD FINDINGS Brain: No restricted diffusion, or hemorrhage.  No intra-axial mass. Communicating hydrocephalus, with mild enlargement of the lateral, third, and fourth ventricles. Abnormal FLAIR signal in the CSF over the convexity, RIGHT greater than LEFT, is accompanied by abnormal enhancement in the subarachnoid space, consistent with leptomeningeal spread of tumor. Reference image 43 series 23; similar findings in the posterior fossa are less impressive, for instance on image 11 series 23. There is mild superimposed atrophy. There is mild chronic microvascular ischemic change in addition to transependymal absorption. No midline shift. No extra-axial fluid. No definite discrete parenchymal metastases although post infusion imaging is mildly motion degraded. Vascular: Flow voids are maintained. Skull and upper cervical spine: Normal marrow signal. Sinuses/Orbits: Slight layering fluid in the sphenoid. Negative orbits. Other: None. MRI CERVICAL SPINE FINDINGS Alignment: Anatomic. Vertebrae: No worrisome osseous lesion. Cord: Normal signal and morphology. No abnormal enhancement of the cord or surrounding meninges. Posterior Fossa, vertebral arteries, paraspinal tissues: Vertebral arteries are patent. LEFT greater than RIGHT thyroid gland enlargement, likely goiter. Consider CT neck with contrast for further characterization. Abnormal enhancement in the posterior fossa, particularly surrounding the LEFT cerebellar tonsil. Disc levels: No disc protrusion or spinal stenosis. 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 This is most prominent over the cerebral convexity on the RIGHT. See discussion above. No definite parenchymal metastases, increased pressure, or midline shift. Unremarkable cervical spine MRI imaging. Electronically Signed   By: JStaci RighterM.D.   On: 09/02/2017 14:50   Mr Cervical Spine W Wo Contrast  Result Date: 09/02/2017 CLINICAL DATA:  Neck pain. Possible thoracic neoplasm. Pleural effusion. Lytic bone lesions. Intractable nausea and vomiting. EXAM: MRI HEAD WITHOUT AND WITH CONTRAST MRI CERVICAL SPINE WITHOUT AND WITH CONTRAST TECHNIQUE: Multiplanar, multiecho pulse sequences of the brain and surrounding structures, and cervical spine, to include the craniocervical junction and cervicothoracic junction, were obtained without and with intravenous contrast. CONTRAST:  161mMULTIHANCE GADOBENATE DIMEGLUMINE 529 MG/ML IV SOLN COMPARISON:  CT chest 08/31/2017. FINDINGS: MRI HEAD FINDINGS Brain: No restricted diffusion, or hemorrhage.  No intra-axial mass. Communicating hydrocephalus, with mild enlargement of the lateral, third, and fourth ventricles. Abnormal FLAIR signal in the CSF over the convexity, RIGHT greater than LEFT, is accompanied by abnormal enhancement in the subarachnoid space, consistent with leptomeningeal spread of tumor. Reference image 43 series 23; similar findings in the posterior fossa are less impressive, for instance on image 11 series 23. There is mild superimposed atrophy. There is mild chronic microvascular ischemic change in addition to transependymal absorption. No midline shift. No extra-axial fluid. No definite discrete parenchymal metastases although post infusion imaging is mildly motion degraded. Vascular: Flow voids are maintained. Skull and upper cervical spine: Normal marrow signal. Sinuses/Orbits: Slight layering fluid in the sphenoid. Negative orbits. Other: None. MRI CERVICAL SPINE FINDINGS Alignment: Anatomic. Vertebrae: No  worrisome osseous lesion. Cord: Normal signal and morphology. No abnormal enhancement of the cord or surrounding meninges. Posterior Fossa, vertebral arteries, paraspinal tissues: Vertebral arteries are patent. LEFT greater than RIGHT thyroid gland enlargement, likely goiter. Consider CT neck with contrast for further characterization. Abnormal enhancement in the posterior fossa, particularly surrounding the LEFT cerebellar tonsil. Disc levels: No disc protrusion or spinal stenosis. 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 This is most prominent over the cerebral convexity on the RIGHT. See discussion above. No definite parenchymal metastases, increased pressure, or midline shift. Unremarkable cervical spine MRI imaging. Electronically Signed   By: Staci Righter M.D.   On: 09/02/2017 14:50   Ct Abdomen Pelvis W Contrast  Result Date: 08/31/2017 CLINICAL DATA:  Vomiting, chills since this morning. Right-sided body pain. Pulmonary embolism suspected, high pretest probability. EXAM: CT ANGIOGRAPHY CHEST CT ABDOMEN AND PELVIS WITH CONTRAST TECHNIQUE: Multidetector CT imaging of the chest was performed using the standard protocol during bolus administration of intravenous contrast. Multiplanar CT image reconstructions and MIPs were obtained to evaluate the vascular anatomy. Multidetector CT imaging of the abdomen and pelvis was performed using the standard protocol during bolus administration of intravenous contrast. CONTRAST:  113m ISOVUE-370 IOPAMIDOL (ISOVUE-370) INJECTION 76% COMPARISON:  None. FINDINGS: CTA CHEST FINDINGS Cardiovascular: Some of the most peripheral segmental and subsegmental pulmonary arteries are difficult to definitively characterize due to mild patient breathing motion artifact, however, there is no pulmonary embolism identified within the main, lobar or central segmental pulmonary arteries bilaterally. Heart size is normal. No  pericardial effusion. Thoracic aorta is normal in caliber and configuration. No aneurysm or evidence of dissection. Mediastinum/Nodes: Scattered small and mildly prominent lymph nodes within the mediastinum and right hilum. Esophagus appears normal. Trachea and central bronchi are unremarkable. Lungs/Pleura: Right pleural effusion, moderate to large in size, with adjacent compressive atelectasis. Associated edema within the right upper lobe and right lower lobe. Left lung is clear. Musculoskeletal: Destructive changes of the proximal right humerus, incompletely imaged. Additional lytic foci within the T6 and T7 vertebral bodies, consistent with metastases. Probable additional lytic lesion within the medial aspects of the left tenth rib. Review of the MIP images confirms the above findings. CT ABDOMEN and PELVIS FINDINGS Hepatobiliary: Tiny hypodense foci within the left liver lobe, much too small to definitively characterize, most likely small cysts. Gallbladder appears normal. No bile duct dilatation. Pancreas: Unremarkable. No pancreatic ductal dilatation or surrounding inflammatory changes. Spleen: Normal in size without focal abnormality. Adrenals/Urinary Tract: Adrenal glands are unremarkable. Kidneys are normal, without renal calculi, focal lesion, or hydronephrosis. Bladder is unremarkable. Stomach/Bowel: Bowel is normal in caliber. No bowel wall thickening or evidence of bowel wall inflammation. Appendix is normal. Stomach is unremarkable, partially decompressed. Vascular/Lymphatic: No significant vascular findings are present. No enlarged abdominal or pelvic lymph nodes. Reproductive: Status post hysterectomy. No adnexal masses. Other: No free fluid or abscess collection. No free intraperitoneal air. No soft tissue mass seen. Musculoskeletal: Subtle small lucency within the right inferior pubic ramus, suspicious for additional metastasis. Similar lytic lesion within the upper sacrum, near the midline, again  suspicious for metastasis. More convincing metastases are seen within the L4 vertebral body and right iliac bone. Review of the MIP images confirms the above findings. IMPRESSION: 1. Right pleural effusion, moderate to large in size, with adjacent compressive atelectasis. Associated pulmonary edema within the right upper lobe and right lower lobe. Left lung is clear. 2. Heterogeneous low-density areas within the atelectasis at the right lung base, suspicious for neoplastic mass obscured by the surrounding atelectasis, a possible primary carcinoma for the osseous metastases described below. 3. No pulmonary embolism. 4. Destructive changes of the proximal right humerus, almost certainly neoplastic, metastatic versus primary carcinoma, incompletely imaged. Additional metastases identified within the thoracic spine, lumbar spine and osseous pelvis, as detailed above. 5. Mild lymphadenopathy within the mediastinum and right hilum, most likely reactive,  but metastatic lymphadenopathy certainly not excluded given the osseous metastases described above. 6. No acute intra-abdominal or intrapelvic abnormality. No bowel obstruction or evidence of bowel wall inflammation. No evidence of acute solid organ abnormality. Appendix is normal. These results were called by telephone at the time of interpretation on 08/31/2017 at 8:14 pm to Dr. Blanchie Dessert , who verbally acknowledged these results. Electronically Signed   By: Franki Cabot M.D.   On: 08/31/2017 20:14   Ir Thoracentesis Asp Pleural Space W/img Guide  Result Date: 09-24-17 INDICATION: Patient with a right humeral lytic lesion along with a right lung lesion and a right pleural effusion. Request is made for diagnostic and therapeutic thoracentesis. EXAM: ULTRASOUND GUIDED DIAGNOSTIC AND THERAPEUTIC THORACENTESIS MEDICATIONS: 2% lidocaine COMPLICATIONS: None immediate. PROCEDURE: An ultrasound guided thoracentesis was thoroughly discussed with the patient and  questions answered. The benefits, risks, alternatives and complications were also discussed. The patient understands and wishes to proceed with the procedure. Written consent was obtained. Ultrasound was performed to localize and mark an adequate pocket of fluid in the right chest. The area was then prepped and draped in the normal sterile fashion. 2% Lidocaine was used for local anesthesia. Under ultrasound guidance a Safe-T-Centesis catheter was introduced. Thoracentesis was performed. The catheter was removed and a dressing applied. FINDINGS: A total of approximately 0.45 L of serous fluid was removed. Samples were sent to the laboratory as requested by the clinical team. IMPRESSION: Successful ultrasound guided right thoracentesis yielding 0.45 L of pleural fluid. Read by: Saverio Danker, PA-C Electronically Signed   By: Sandi Mariscal M.D.   On: 09-24-17 09:58      IMPRESSION/PLAN: 1. 72 y.o. 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 I met with the patient and after reviewing her films with Dr. Tammi Klippel, she is a candidate for palliative radiotherapy. She is most symptomatic in the right humerus and with her brain disease. We also discussed that the T6 site was of concern with more posterior involvement of the vertebrae, and hence would recommend treatment to each of these three sites. We discussed the risks, benefits, short, and long term effects of radiotherapy, and the patient is interested in proceeding. Dr. Tammi Klippel has recommended 30 Gy in 10 fractions over two weeks. We will proceed with simulation today, and proceed with treatment on Wednesday, 09/06/17. Written consent is obtained and placed in the chart, a copy was provided to the patient. I have also discussed her case with Dr. Lebron Conners, and he will plan systemic therapy following radiotherapy. She is counseled on the possibility of additional tissue sampling for molecular profiling. 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 We will taper her dose of this following treatment, but would recommend '4mg'$  TID at discharge.  2. Pleural effusion. The patient will continue to have assessment of this during her hospitalization. We will follow this clinically as well.  3. 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 The patient will continue her current regimen. We would anticipate improvement of her pain about 1 1/2-2 weeks into the course of her treatment and will continue her current regimen of pain management.      Carola Rhine, [facility]   Page Me

## 2017-09-04 NOTE — Progress Notes (Signed)
Patient Demographics:    Samantha [name], is a 72 y.o. female, DOB - February 17, 1946, EQA:[id number]  Admit date - 08/31/2017   Admitting Physician Merton Border, MD  Outpatient Primary MD for the patient is Patient, No Pcp Per  LOS - 4   Chief Complaint  Patient presents with  . 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 No focal CNS disease at this time.  Transferred from [facility] to [facility] on 09/02/2017 for possible radiation therapy, she was on Xarelto for DVT . Cytology from thoracentesis of 03-Sep-2017 pending  Plan:-  1)Metastatic lung cancer with Leptomeningeal disease---- on 09/04/17 patient underwent lumbar puncture with check of opening pressure and cytology, MRI of the thoracic and lumbar spine with and without contrast pending does not indicate additional foci of leptomeningeal disease, however it does show foci of bony metastasis, treated with dexamethasone , possible radiation oncology consult,  patient may benefit from radiation therapy for leptomeningeal disease, cytology from thoracentesis of 2017-09-03 pending. MRI result previously reviewed with Dr  Annette Stable, neurosurgery, does not appears that patient has any urgent neurosurgical needs. He recommends LP, for opening pressure, cytology and R-Tx if patient fail to this measure might need further neurosurgery evaluation.   2)Right humerus with multiple lytic lesions-further MRI pending, may need orthopedic consultation if impending fracture  3)Right upper extremity DVT-Xarelto on hold, Resume IV heparin after LP on 09/04/17,   Resume Xarelto once surgical procedures are completed  4)Headaches with nausea and vomiting-secondary to leptomeningeal disease in the setting of metastatic lung cancer, improved with Decadron  Code Status : Full code  Disposition Plan  : TBD  Consults  : Oncology Dr. Lebron Conners, neurosurgery Dr. Annette Stable  DVT Prophylaxis  : iv Heparin   Lab Results  Component Value Date   PLT 263 09/04/2017    Inpatient Medications  Scheduled Meds: . bisacodyl  10 mg Rectal Once  . dexamethasone  8 mg Intravenous Q8H  . feeding supplement (ENSURE ENLIVE)  237 mL Oral BID BM  . polyethylene glycol  17 g Oral BID  . senna  1 tablet Oral Daily  . sodium chloride flush  3 mL Intravenous Q12H   Continuous Infusions: . sodium chloride    . heparin     PRN Meds:.sodium chloride, acetaminophen **OR** acetaminophen, albuterol, lidocaine, morphine injection, ondansetron **OR** ondansetron (ZOFRAN) IV, oxyCODONE, sodium chloride flush, zolpidem    Anti-infectives (From admission, onward)   None        Objective:  Vitals:   09/03/17 2031 09/04/17 0446 09/04/17 1538 09/04/17 1648  BP: 139/77 137/86 129/68 124/66  Pulse: 86 83 70 70  Resp: 16 16 18 18   Temp: 98.8 F (37.1 C) 97.8 F (36.6 C) 98 F (36.7 C)   TempSrc: Oral Oral Oral   SpO2: 98% 96% 97% 97%  Weight:      Height:        Wt Readings from Last 3 Encounters:  09/02/17 70.8 kg (156 lb 1.4 oz)  08/07/17 78.5 kg (173 lb)  05/18/16 78.5 kg (173 lb)     Intake/Output Summary (Last 24 hours) at 09/04/2017  1720 Last data filed at 09/04/2017 0931 Gross per 24 hour  Intake 3 ml  Output -  Net 3 ml     Physical Exam  Gen:- Awake Alert, in no apparent distress, complains of right shoulder pain HEENT:- [place].AT, No sclera icterus Neck-Supple Neck,No JVD,.  Lungs-  CTAB  CV- S1, S2 normal Abd-  +ve B.Sounds, Abd Soft, No tenderness,    Extremity/Skin:- No  edema,    Psych-affect is appropriate Neuro-generalized weakness without new focal deficits    Data Review:   Micro Results Recent Results (from the past 240 hour(s))  Acid Fast Smear (AFB)     Status: None   Collection Time: September 13, 2017  9:21 AM  Result Value Ref Range Status   AFB Specimen Processing Concentration  Final   Acid Fast Smear Negative  Final    Comment: (NOTE) Performed At: [facility] [place], [place] [id number] Rush Farmer MD OI:[id number]    Source (AFB) PLEURAL  Final    Comment: RIGHT Performed at [facility], [place] [address], [place], [place] [address]   Culture, body fluid-bottle     Status: None (Preliminary result)   Collection Time: 09/13/17  9:21 AM  Result Value Ref Range Status   Specimen Description PLEURAL RIGHT  Final   Special Requests NONE  Final   Culture   Final    NO GROWTH 3 DAYS Performed at [facility] [address], [place], [place] [address]    Report Status PENDING  Incomplete  Gram stain     Status: None   Collection Time: 2017/09/13  9:21 AM  Result Value Ref Range Status   Specimen Description PLEURAL RIGHT  Final   Special Requests NONE  Final   Gram Stain   Final    RARE WBC PRESENT, PREDOMINANTLY MONONUCLEAR NO ORGANISMS SEEN Performed at [facility], [address], [place], [place] [address]    Report Status 2017/09/13 FINAL  Final    Radiology Reports Dg Chest 1 View  Result Date: Sep 13, 2017 CLINICAL DATA:  Thoracentesis. EXAM: CHEST 1 VIEW COMPARISON:  CT 08/31/2016. FINDINGS: Mediastinum and hilar structures are  normal. Heart size normal. Atelectatic changes right lung base. Small right pleural effusion. No pneumothorax destructive right humeral lesion best identified on CT. IMPRESSION: 1. Persistent atelectasis/mass right lung base. Reference is made to prior CT report 08/31/2016. 2.  Destructive right humeral lesion best identified on prior CT. Electronically Signed   By: Marcello Moores  Register   On: 09/13/17 09:39   Ct Chest Wo Contrast  Result Date: 09-13-2017 CLINICAL DATA:  Status post thoracentesis. Evaluate right lower lobe process. EXAM: CT CHEST WITHOUT CONTRAST TECHNIQUE: Multidetector CT imaging of the chest was performed following the standard protocol without IV contrast. COMPARISON:  CT chest, abdomen and pelvis 08/31/2017 FINDINGS: Cardiovascular: The heart is  normal in size. No pericardial effusion. The aorta is normal in caliber. No atherosclerotic calcifications. No significant coronary artery calcifications. Mediastinum/Nodes: Mediastinal and right hilar lymphadenopathy. Anterior mediastinal node on image number 40 measures 7 mm. 10 mm pretracheal lymph node on image number 43. 9 mm aorticopulmonary window node on image number 43. 13 mm subcarinal lymph node on the right side on image number 57. The esophagus is grossly normal. Lungs/Pleura: Status post right-sided thoracentesis with a small residual pleural effusion. There is a 6 x 5 cm masslike lesion in the right upper lobe worrisome for neoplasm given the adenopathy and the destructive metastatic bone disease. 4 mm nodule noted at the left lung base on image number 86. Do not see any other obvious pulmonary nodules to suggest pulmonary metastatic disease. Upper Abdomen: No obvious hepatic or adrenal gland metastasis. Contrast in the gallbladder related to vicarious excretion. Musculoskeletal: No obvious breast masses, supraclavicular or axillary adenopathy. Enlarged multinodular thyroid goiter is noted. Again demonstrated is lytic metastatic disease  involving the thoracic vertebral bodies, a few ribs and possibly the sternum. T7 is the most affected but no definite canal encroachment. MRI thoracic spine may be helpful for further evaluation. IMPRESSION: 1. Status post right thoracentesis. 6 x 5 cm masslike lesion in the right lower lobe worrisome for primary lung neoplasm given the mediastinal and right hilar adenopathy and diffuse osseous metastatic disease. Recommend correlation with recent right pleural fluid. PET-CT may be helpful to assess for safest biopsy route. 2. 4 mm left lower lobe pulmonary nodules but no other definite pulmonary lesions. 3. No definite upper abdominal metastatic disease. 4. Multinodular thyroid goiter. 5. No obvious breast masses. Electronically Signed   By: Marijo Sanes M.D.   On: 03-Sep-2017 16:27   Ct Angio Chest Pe W And/or Wo Contrast  Result Date: 08/31/2017 CLINICAL DATA:  Vomiting, chills since this morning. Right-sided body pain. Pulmonary embolism suspected, high pretest probability. EXAM: CT ANGIOGRAPHY CHEST CT ABDOMEN AND PELVIS WITH CONTRAST TECHNIQUE: Multidetector CT imaging of the chest was performed using the standard protocol during bolus administration of intravenous contrast. Multiplanar CT image reconstructions and MIPs were obtained to evaluate the vascular anatomy. Multidetector CT imaging of the abdomen and pelvis was performed using the standard protocol during bolus administration of intravenous contrast. CONTRAST:  144mL ISOVUE-370 IOPAMIDOL (ISOVUE-370) INJECTION 76% COMPARISON:  None. FINDINGS: CTA CHEST FINDINGS Cardiovascular: Some of the most peripheral segmental and subsegmental pulmonary arteries are difficult to definitively characterize due to mild patient breathing motion artifact, however, there is no pulmonary embolism identified within the main, lobar or central segmental pulmonary arteries bilaterally. Heart size is normal. No pericardial effusion. Thoracic aorta is normal in caliber  and configuration. No aneurysm or evidence of dissection. Mediastinum/Nodes: Scattered small and mildly prominent lymph nodes within the mediastinum and right hilum. Esophagus appears normal. Trachea and central bronchi are unremarkable. Lungs/Pleura: Right pleural effusion, moderate to large in size, with adjacent compressive atelectasis. Associated edema within the right upper lobe and right lower lobe. Left lung is clear. Musculoskeletal: Destructive changes of the proximal right humerus, incompletely imaged. Additional lytic foci within the T6 and T7 vertebral bodies, consistent with metastases. Probable additional lytic lesion within the medial aspects of the left tenth rib. Review of the MIP images confirms the above findings. CT ABDOMEN and PELVIS FINDINGS Hepatobiliary: Tiny hypodense foci within the left liver lobe, much too small to definitively characterize, most likely small cysts. Gallbladder appears normal. No bile duct dilatation. Pancreas: Unremarkable. No  pancreatic ductal dilatation or surrounding inflammatory changes. Spleen: Normal in size without focal abnormality. Adrenals/Urinary Tract: Adrenal glands are unremarkable. Kidneys are normal, without renal calculi, focal lesion, or hydronephrosis. Bladder is unremarkable. Stomach/Bowel: Bowel is normal in caliber. No bowel wall thickening or evidence of bowel wall inflammation. Appendix is normal. Stomach is unremarkable, partially decompressed. Vascular/Lymphatic: No significant vascular findings are present. No enlarged abdominal or pelvic lymph nodes. Reproductive: Status post hysterectomy. No adnexal masses. Other: No free fluid or abscess collection. No free intraperitoneal air. No soft tissue mass seen. Musculoskeletal: Subtle small lucency within the right inferior pubic ramus, suspicious for additional metastasis. Similar lytic lesion within the upper sacrum, near the midline, again suspicious for metastasis. More convincing metastases are  seen within the L4 vertebral body and right iliac bone. Review of the MIP images confirms the above findings. IMPRESSION: 1. Right pleural effusion, moderate to large in size, with adjacent compressive atelectasis. Associated pulmonary edema within the right upper lobe and right lower lobe. Left lung is clear. 2. Heterogeneous low-density areas within the atelectasis at the right lung base, suspicious for neoplastic mass obscured by the surrounding atelectasis, a possible primary carcinoma for the osseous metastases described below. 3. No pulmonary embolism. 4. Destructive changes of the proximal right humerus, almost certainly neoplastic, metastatic versus primary carcinoma, incompletely imaged. Additional metastases identified within the thoracic spine, lumbar spine and osseous pelvis, as detailed above. 5. Mild lymphadenopathy within the mediastinum and right hilum, most likely reactive, but metastatic lymphadenopathy certainly not excluded given the osseous metastases described above. 6. No acute intra-abdominal or intrapelvic abnormality. No bowel obstruction or evidence of bowel wall inflammation. No evidence of acute solid organ abnormality. Appendix is normal. These results were called by telephone at the time of interpretation on 08/31/2017 at 8:14 pm to Dr. Blanchie Dessert , who verbally acknowledged these results. Electronically Signed   By: Franki Cabot M.D.   On: 08/31/2017 20:14   Mr Jeri Cos EL Contrast  Result Date: 09/02/2017 CLINICAL DATA:  Neck pain. Possible thoracic neoplasm. Pleural effusion. Lytic bone lesions. Intractable nausea and vomiting. EXAM: MRI HEAD WITHOUT AND WITH CONTRAST MRI CERVICAL SPINE WITHOUT AND WITH CONTRAST TECHNIQUE: Multiplanar, multiecho pulse sequences of the brain and surrounding structures, and cervical spine, to include the craniocervical junction and cervicothoracic junction, were obtained without and with intravenous contrast. CONTRAST:  36mL MULTIHANCE  GADOBENATE DIMEGLUMINE 529 MG/ML IV SOLN COMPARISON:  CT chest 08/31/2017. FINDINGS: MRI HEAD FINDINGS Brain: No restricted diffusion, or hemorrhage.  No intra-axial mass. Communicating hydrocephalus, with mild enlargement of the lateral, third, and fourth ventricles. Abnormal FLAIR signal in the CSF over the convexity, RIGHT greater than LEFT, is accompanied by abnormal enhancement in the subarachnoid space, consistent with leptomeningeal spread of tumor. Reference image 43 series 23; similar findings in the posterior fossa are less impressive, for instance on image 11 series 23. There is mild superimposed atrophy. There is mild chronic microvascular ischemic change in addition to transependymal absorption. No midline shift. No extra-axial fluid. No definite discrete parenchymal metastases although post infusion imaging is mildly motion degraded. Vascular: Flow voids are maintained. Skull and upper cervical spine: Normal marrow signal. Sinuses/Orbits: Slight layering fluid in the sphenoid. Negative orbits. Other: None. MRI CERVICAL SPINE FINDINGS Alignment: Anatomic. Vertebrae: No worrisome osseous lesion. Cord: Normal signal and morphology. No abnormal enhancement of the cord or surrounding meninges. Posterior Fossa, vertebral arteries, paraspinal tissues: Vertebral arteries are patent. LEFT greater than RIGHT thyroid gland enlargement, likely goiter. Consider  CT neck with contrast for further characterization. Abnormal enhancement in the posterior fossa, particularly surrounding the LEFT cerebellar tonsil. Disc levels: No disc protrusion or spinal stenosis. 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 This is most prominent over the cerebral convexity on the RIGHT. See discussion above. No definite parenchymal metastases, increased pressure, or midline shift. Unremarkable cervical spine MRI imaging. Electronically Signed   By: Staci Righter M.D.   On:  09/02/2017 14:50   Mr Cervical Spine W Wo Contrast  Result Date: 09/02/2017 CLINICAL DATA:  Neck pain. Possible thoracic neoplasm. Pleural effusion. Lytic bone lesions. Intractable nausea and vomiting. EXAM: MRI HEAD WITHOUT AND WITH CONTRAST MRI CERVICAL SPINE WITHOUT AND WITH CONTRAST TECHNIQUE: Multiplanar, multiecho pulse sequences of the brain and surrounding structures, and cervical spine, to include the craniocervical junction and cervicothoracic junction, were obtained without and with intravenous contrast. CONTRAST:  25mL MULTIHANCE GADOBENATE DIMEGLUMINE 529 MG/ML IV SOLN COMPARISON:  CT chest 08/31/2017. FINDINGS: MRI HEAD FINDINGS Brain: No restricted diffusion, or hemorrhage.  No intra-axial mass. Communicating hydrocephalus, with mild enlargement of the lateral, third, and fourth ventricles. Abnormal FLAIR signal in the CSF over the convexity, RIGHT greater than LEFT, is accompanied by abnormal enhancement in the subarachnoid space, consistent with leptomeningeal spread of tumor. Reference image 43 series 23; similar findings in the posterior fossa are less impressive, for instance on image 11 series 23. There is mild superimposed atrophy. There is mild chronic microvascular ischemic change in addition to transependymal absorption. No midline shift. No extra-axial fluid. No definite discrete parenchymal metastases although post infusion imaging is mildly motion degraded. Vascular: Flow voids are maintained. Skull and upper cervical spine: Normal marrow signal. Sinuses/Orbits: Slight layering fluid in the sphenoid. Negative orbits. Other: None. MRI CERVICAL SPINE FINDINGS Alignment: Anatomic. Vertebrae: No worrisome osseous lesion. Cord: Normal signal and morphology. No abnormal enhancement of the cord or surrounding meninges. Posterior Fossa, vertebral arteries, paraspinal tissues: Vertebral arteries are patent. LEFT greater than RIGHT thyroid gland enlargement, likely goiter. Consider CT neck with  contrast for further characterization. Abnormal enhancement in the posterior fossa, particularly surrounding the LEFT cerebellar tonsil. Disc levels: No disc protrusion or spinal stenosis. 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 This is most prominent over the cerebral convexity on the RIGHT. See discussion above. No definite parenchymal metastases, increased pressure, or midline shift. Unremarkable cervical spine MRI imaging. Electronically Signed   By: Staci Righter M.D.   On: 09/02/2017 14:50   Mr Thoracic Spine W Wo Contrast  Result Date: 09/04/2017 CLINICAL DATA:  Widely metastatic malignancy, newly diagnosed. EXAM: MRI THORACIC AND LUMBAR SPINE WITHOUT AND WITH CONTRAST TECHNIQUE: Multiplanar and multiecho pulse sequences of the thoracic and lumbar spine were obtained without and with intravenous contrast. CONTRAST:  37mL MULTIHANCE GADOBENATE DIMEGLUMINE 529 MG/ML IV SOLN COMPARISON:  CT chest, abdomen, and pelvis 08/31/2017 FINDINGS: MRI THORACIC SPINE FINDINGS Alignment:  Normal. Vertebrae: Multiple osseous metastases as seen on recent CT, the largest measuring approximately 1.7 cm in the left half of the T6 vertebral body. A 1 cm lesion is present in the T4 vertebral body. Scattered smaller vertebral body and posterior element lesions are noted. No epidural or other extraosseous tumor is identified. Vertebral body heights are preserved without a fracture identified. A posteromedial left tenth rib lesion is also again noted. Cord: Normal signal and morphology. No abnormal intradural enhancement. Paraspinal and other soft tissues: Multinodular thyroid goiter. Right pleural effusion, suspected right lower lobe  lung mass, and thoracic lymphadenopathy more fully evaluated on recent chest CT. Disc levels: Mild thoracic spondylosis with only minimal disc bulging in the midthoracic spine. No sizable focal disc herniation, spinal stenosis, or neural  foraminal stenosis. MRI LUMBAR SPINE FINDINGS Segmentation:  Standard. Alignment:  Normal. Vertebrae: L4 and L5 vertebral body enhancing metastases measuring approximately 2 cm each. Smaller enhancing lesions in the L1, L2, L4, and S1 vertebral bodies. Bilateral L4-5 facet enhancement is favored to be degenerative. A 5 mm sclerotic lesion is partially visualized in the posterior left ilium. Vertebral body heights are preserved without a fracture identified. No epidural or other extraosseous tumor is identified. Conus medullaris: Extends to the L1-2 level and appears normal. Normal appearance of the cauda equina without nerve root thickening or enhancement. Paraspinal and other soft tissues: Unremarkable. Disc levels: L1-2 through L3-4: Normal discs. No stenosis. Mild facet arthrosis at L3-4. L4-5: Disc desiccation. Mild disc bulging and mild facet and ligamentum flavum hypertrophy result in mild bilateral lateral recess stenosis and borderline bilateral neural foraminal stenosis. Small left foraminal annular fissure. L5-S1: Disc desiccation. Mild disc bulging with right subarticular annular fissure. Disc approaches the S1 nerve roots in the lateral recesses but does not result in significant stenosis. IMPRESSION: 1. 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 No evidence of epidural or leptomeningeal spinal disease. 2. Mild lower lumbar disc and facet degeneration. Mild lateral recess stenosis at L4-5. Electronically Signed   By: Logan Bores M.D.   On: 09/04/2017 14:55   Mr Lumbar Spine W Wo Contrast  Result Date: 09/04/2017 CLINICAL DATA:  Widely metastatic malignancy, newly diagnosed. EXAM: MRI THORACIC AND LUMBAR SPINE WITHOUT AND WITH CONTRAST TECHNIQUE: Multiplanar and multiecho pulse sequences of the thoracic and lumbar spine were obtained without and with intravenous contrast. CONTRAST:  34mL MULTIHANCE GADOBENATE DIMEGLUMINE 529 MG/ML IV SOLN COMPARISON:  CT  chest, abdomen, and pelvis 08/31/2017 FINDINGS: MRI THORACIC SPINE FINDINGS Alignment:  Normal. Vertebrae: Multiple osseous metastases as seen on recent CT, the largest measuring approximately 1.7 cm in the left half of the T6 vertebral body. A 1 cm lesion is present in the T4 vertebral body. Scattered smaller vertebral body and posterior element lesions are noted. No epidural or other extraosseous tumor is identified. Vertebral body heights are preserved without a fracture identified. A posteromedial left tenth rib lesion is also again noted. Cord: Normal signal and morphology. No abnormal intradural enhancement. Paraspinal and other soft tissues: Multinodular thyroid goiter. Right pleural effusion, suspected right lower lobe lung mass, and thoracic lymphadenopathy more fully evaluated on recent chest CT. Disc levels: Mild thoracic spondylosis with only minimal disc bulging in the midthoracic spine. No sizable focal disc herniation, spinal stenosis, or neural foraminal stenosis. MRI LUMBAR SPINE FINDINGS Segmentation:  Standard. Alignment:  Normal. Vertebrae: L4 and L5 vertebral body enhancing metastases measuring approximately 2 cm each. Smaller enhancing lesions in the L1, L2, L4, and S1 vertebral bodies. Bilateral L4-5 facet enhancement is favored to be degenerative. A 5 mm sclerotic lesion is partially visualized in the posterior left ilium. Vertebral body heights are preserved without a fracture identified. No epidural or other extraosseous tumor is identified. Conus medullaris: Extends to the L1-2 level and appears normal. Normal appearance of the cauda equina without nerve root thickening or enhancement. Paraspinal and other soft tissues: Unremarkable. Disc levels: L1-2 through L3-4: Normal discs. No stenosis. Mild facet arthrosis at L3-4. L4-5: Disc desiccation. Mild disc bulging and mild facet and ligamentum flavum  hypertrophy result in mild bilateral lateral recess stenosis and borderline bilateral neural  foraminal stenosis. Small left foraminal annular fissure. L5-S1: Disc desiccation. Mild disc bulging with right subarticular annular fissure. Disc approaches the S1 nerve roots in the lateral recesses but does not result in significant stenosis. IMPRESSION: 1. 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 No evidence of epidural or leptomeningeal spinal disease. 2. Mild lower lumbar disc and facet degeneration. Mild lateral recess stenosis at L4-5. Electronically Signed   By: Logan Bores M.D.   On: 09/04/2017 14:55   Mr Humerus Right W Wo Contrast  Result Date: 09/04/2017 CLINICAL DATA:  Lytic lesion of the humerus. EXAM: MRI OF THE RIGHT HUMERUS WITHOUT AND WITH CONTRAST TECHNIQUE: Multiplanar, multisequence MR imaging of the right humerus was performed before and after the administration of intravenous contrast. CONTRAST:  15 cc MultiHance intravenous contrast. COMPARISON:  CT chest dated 09/22/2017. Right shoulder x-rays dated July 29, 2017. FINDINGS: Bones/Joint/Cartilage There is a large, destructive, enhancing marrow replacing lesion in the proximal humeral metadiaphysis with associated periosteal reaction. The craniocaudal length of involved bone measures approximately 10 cm and extends to the mid humeral shaft. No pathologic fracture. No additional marrow replacing lesion seen. Small glenohumeral joint effusion. Muscles and Tendons Faint muscle edema in the proximal subscapularis and infraspinatus muscles. Small interstitial tear at the infraspinatus myotendinous junction. Soft tissues Mild soft tissue inflammatory changes surrounding the osseous metastasis. Partially visualized small right pleural effusion and mass-like consolidation in the right lower lobe. IMPRESSION: 1. Large, destructive, enhancing marrow replacing lesion in the proximal humeral metadiaphysis, consistent with osseous metastasis. No pathologic fracture. 2. Partially visualized small  right pleural effusion and mass-like consolidation in the right lower lobe, unchanged. Electronically Signed   By: Titus Dubin M.D.   On: 09/04/2017 14:27   Ct Abdomen Pelvis W Contrast  Result Date: 08/31/2017 CLINICAL DATA:  Vomiting, chills since this morning. Right-sided body pain. Pulmonary embolism suspected, high pretest probability. EXAM: CT ANGIOGRAPHY CHEST CT ABDOMEN AND PELVIS WITH CONTRAST TECHNIQUE: Multidetector CT imaging of the chest was performed using the standard protocol during bolus administration of intravenous contrast. Multiplanar CT image reconstructions and MIPs were obtained to evaluate the vascular anatomy. Multidetector CT imaging of the abdomen and pelvis was performed using the standard protocol during bolus administration of intravenous contrast. CONTRAST:  164mL ISOVUE-370 IOPAMIDOL (ISOVUE-370) INJECTION 76% COMPARISON:  None. FINDINGS: CTA CHEST FINDINGS Cardiovascular: Some of the most peripheral segmental and subsegmental pulmonary arteries are difficult to definitively characterize due to mild patient breathing motion artifact, however, there is no pulmonary embolism identified within the main, lobar or central segmental pulmonary arteries bilaterally. Heart size is normal. No pericardial effusion. Thoracic aorta is normal in caliber and configuration. No aneurysm or evidence of dissection. Mediastinum/Nodes: Scattered small and mildly prominent lymph nodes within the mediastinum and right hilum. Esophagus appears normal. Trachea and central bronchi are unremarkable. Lungs/Pleura: Right pleural effusion, moderate to large in size, with adjacent compressive atelectasis. Associated edema within the right upper lobe and right lower lobe. Left lung is clear. Musculoskeletal: Destructive changes of the proximal right humerus, incompletely imaged. Additional lytic foci within the T6 and T7 vertebral bodies, consistent with metastases. Probable additional lytic lesion within  the medial aspects of the left tenth rib. Review of the MIP images confirms the above findings. CT ABDOMEN and PELVIS FINDINGS Hepatobiliary: Tiny hypodense foci within the left liver lobe, much too small to definitively characterize, most  likely small cysts. Gallbladder appears normal. No bile duct dilatation. Pancreas: Unremarkable. No pancreatic ductal dilatation or surrounding inflammatory changes. Spleen: Normal in size without focal abnormality. Adrenals/Urinary Tract: Adrenal glands are unremarkable. Kidneys are normal, without renal calculi, focal lesion, or hydronephrosis. Bladder is unremarkable. Stomach/Bowel: Bowel is normal in caliber. No bowel wall thickening or evidence of bowel wall inflammation. Appendix is normal. Stomach is unremarkable, partially decompressed. Vascular/Lymphatic: No significant vascular findings are present. No enlarged abdominal or pelvic lymph nodes. Reproductive: Status post hysterectomy. No adnexal masses. Other: No free fluid or abscess collection. No free intraperitoneal air. No soft tissue mass seen. Musculoskeletal: Subtle small lucency within the right inferior pubic ramus, suspicious for additional metastasis. Similar lytic lesion within the upper sacrum, near the midline, again suspicious for metastasis. More convincing metastases are seen within the L4 vertebral body and right iliac bone. Review of the MIP images confirms the above findings. IMPRESSION: 1. Right pleural effusion, moderate to large in size, with adjacent compressive atelectasis. Associated pulmonary edema within the right upper lobe and right lower lobe. Left lung is clear. 2. Heterogeneous low-density areas within the atelectasis at the right lung base, suspicious for neoplastic mass obscured by the surrounding atelectasis, a possible primary carcinoma for the osseous metastases described below. 3. No pulmonary embolism. 4. Destructive changes of the proximal right humerus, almost certainly  neoplastic, metastatic versus primary carcinoma, incompletely imaged. Additional metastases identified within the thoracic spine, lumbar spine and osseous pelvis, as detailed above. 5. Mild lymphadenopathy within the mediastinum and right hilum, most likely reactive, but metastatic lymphadenopathy certainly not excluded given the osseous metastases described above. 6. No acute intra-abdominal or intrapelvic abnormality. No bowel obstruction or evidence of bowel wall inflammation. No evidence of acute solid organ abnormality. Appendix is normal. These results were called by telephone at the time of interpretation on 08/31/2017 at 8:14 pm to Dr. Blanchie Dessert , who verbally acknowledged these results. Electronically Signed   By: Franki Cabot M.D.   On: 08/31/2017 20:14   Ir Thoracentesis Asp Pleural Space W/img Guide  Result Date: 09/02/2017 INDICATION: Patient with a right humeral lytic lesion along with a right lung lesion and a right pleural effusion. Request is made for diagnostic and therapeutic thoracentesis. EXAM: ULTRASOUND GUIDED DIAGNOSTIC AND THERAPEUTIC THORACENTESIS MEDICATIONS: 2% lidocaine COMPLICATIONS: None immediate. PROCEDURE: An ultrasound guided thoracentesis was thoroughly discussed with the patient and questions answered. The benefits, risks, alternatives and complications were also discussed. The patient understands and wishes to proceed with the procedure. Written consent was obtained. Ultrasound was performed to localize and mark an adequate pocket of fluid in the right chest. The area was then prepped and draped in the normal sterile fashion. 2% Lidocaine was used for local anesthesia. Under ultrasound guidance a Safe-T-Centesis catheter was introduced. Thoracentesis was performed. The catheter was removed and a dressing applied. FINDINGS: A total of approximately 0.45 L of serous fluid was removed. Samples were sent to the laboratory as requested by the clinical team. IMPRESSION:  Successful ultrasound guided right thoracentesis yielding 0.45 L of pleural fluid. Read by: Saverio Danker, PA-C Electronically Signed   By: Sandi Mariscal M.D.   On: 2017-09-02 09:58   Dg Fluoro Guide Lumbar Puncture  Result Date: 09/04/2017 CLINICAL DATA:  Leptomeningeal disease, metastasis EXAM: DIAGNOSTIC LUMBAR PUNCTURE UNDER FLUOROSCOPIC GUIDANCE FLUOROSCOPY TIME:  Fluoroscopy Time:  0.22 min Radiation Exposure Index (if provided by the fluoroscopic device): 7.84 mGy Number of Acquired Spot Images: 0 PROCEDURE: Informed consent was obtained  from the patient prior to the procedure, including potential complications of headache, allergy, and pain. With the patient prone, the lower back was prepped with Betadine. 1% Lidocaine was used for local anesthesia. Lumbar puncture was performed at the L3-4 level using a 20 gauge needle with return of clear CSF with an opening pressure of 30 cm water. 10 ml of CSF were obtained for laboratory studies. The patient tolerated the procedure well and there were no apparent complications. IMPRESSION: Successful lumbar puncture under fluoroscopic guidance as described above. Electronically Signed   By: Rolm Baptise M.D.   On: 09/04/2017 15:29     CBC Recent Labs  Lab 08/31/17 1446 09/11/17 0551 09/02/17 0432 09/03/17 0015 09/04/17 0001  WBC 7.9 6.3 5.6 5.4 12.5*  HGB 13.1 12.3 12.2 12.6 12.4  HCT 39.1 37.5 37.9 38.0 37.3  PLT 229 223 207 230 263  MCV 88.3 88.4 89.6 87.4 87.6  MCH 29.6 29.0 28.8 29.0 29.1  MCHC 33.5 32.8 32.2 33.2 33.2  RDW 13.5 13.8 14.0 13.4 13.6    Chemistries  Recent Labs  Lab 08/31/17 1446 09/02/17 0432  NA 136 136  K 4.1 4.6  CL 103 103  CO2 20* 21*  GLUCOSE 115* 111*  BUN 12 14  CREATININE 0.79 0.80  CALCIUM 9.6 9.2  AST 20  --   ALT 14  --   ALKPHOS 107  --   BILITOT 0.6  --    ------------------------------------------------------------------------------------------------------------------ No results for input(s):  CHOL, HDL, LDLCALC, TRIG, CHOLHDL, LDLDIRECT in the last 72 hours.  No results found for: HGBA1C ------------------------------------------------------------------------------------------------------------------ No results for input(s): TSH, T4TOTAL, T3FREE, THYROIDAB in the last 72 hours.  Invalid input(s): FREET3 ------------------------------------------------------------------------------------------------------------------ No results for input(s): VITAMINB12, FOLATE, FERRITIN, TIBC, IRON, RETICCTPCT in the last 72 hours.  Coagulation profile No results for input(s): INR, PROTIME in the last 168 hours.  No results for input(s): DDIMER in the last 72 hours.  Cardiac Enzymes No results for input(s): CKMB, TROPONINI, MYOGLOBIN in the last 168 hours.  Invalid input(s): CK ------------------------------------------------------------------------------------------------------------------ No results found for: BNP   Roxan Hockey M.D on 09/04/2017 at 5:20 PM  Between 7am to 7pm - Pager - [phone]  After 7pm go to [email or web address] - password [email or web address]  [facility] -  Office  [phone]   Voice Recognition Viviann Spare dictation system was used to create this note, attempts have been made to correct errors. Please contact the author with questions and/or clarifications.

## 2017-09-04 NOTE — Evaluation (Signed)
Physical Therapy Evaluation Patient Details Name: Samantha [name] MRN: [id number] DOB: [birth year] Today's Date: 09/04/2017   History of Present Illness  72 year old female admitted on 08/31/2017 patient was found to have what appears to be a widely metastatic malignancy including right lower lobe mass measuring at least 6 cm with associated mediastinal lymphadenopathy, multifocal skeletal lesions including destructive lesion in the right humerus and several vertebral lesions without pathological fracture in addition to a new finding of leptomeningeal enhancement on MRI of the brain suggestive of metastatic disease there.   Clinical Impression  Patient presents with close to baseline level mobility. However, question baseline cognition and possible need for frequent supervision at home (defer to OT if needed 24 hour supervision.)  PT to follow acutely and continue recommendations as they arise.     Follow Up Recommendations No PT follow up;Supervision - Intermittent(question cognition)    Equipment Recommendations  None recommended by PT    Recommendations for Other Services OT consult     Precautions / Restrictions Precautions Precautions: Fall Restrictions Weight Bearing Restrictions: No      Mobility  Bed Mobility Overal bed mobility: Modified Independent                Transfers Overall transfer level: Modified independent                  Ambulation/Gait Ambulation/Gait assistance: Min guard;Supervision Ambulation Distance (Feet): 400 Feet Assistive device: None Gait Pattern/deviations: Step-through pattern;Decreased stride length;Wide base of support;Staggering right     General Gait Details: slow pace, short strides, increased BOS and noted tendency for LOB to R with ambulation with occasional minguard for safety  Stairs Stairs: Yes Stairs assistance: Min guard;Supervision Stair Management: One rail Left;Alternating pattern;Step to  pattern;Forwards Number of Stairs: 4 General stair comments: ascending with rail and step to sequence, descending stairs with step to patterm,    Wheelchair Mobility    Modified Rankin (Stroke Patients Only)       Balance Overall balance assessment: Needs assistance   Sitting balance-Leahy Scale: Good Sitting balance - Comments: leaning to floor to don socks, no LOB     Standing balance-Leahy Scale: Good Standing balance comment: able to stand with eyes closed feet apart 10s, turns in circle with increased time, reaches for item on floor wtih S, twists to look over shoulders with S, foward reach 10" without LOB, stand in SLS <3 s.                             Pertinent Vitals/Pain Pain Assessment: Faces Faces Pain Scale: Hurts little more Pain Location: denies pain at rest, but noted grimacing with R UE movement Pain Descriptors / Indicators: Grimacing;Guarding Pain Intervention(s): Monitored during session    Home Living Family/patient expects to be discharged to:: Private residence Living Arrangements: Alone Available Help at Discharge: Family;Available PRN/intermittently Type of Home: House Home Access: Stairs to enter Entrance Stairs-Rails: Psychiatric nurse of Steps: 6 Home Layout: One level Home Equipment: None      Prior Function Level of Independence: Independent         Comments: worked doing "domestic" tasks, but reports family assisted with transportation and groceries     Hand Dominance   Dominant Hand: Right    Extremity/Trunk Assessment   Upper Extremity Assessment Upper Extremity Assessment: RUE deficits/detail RUE Deficits / Details: limited AAROM to about 95 degrees with pain at humerus    Lower Extremity  Assessment Lower Extremity Assessment: Overall WFL for tasks assessed    Cervical / Trunk Assessment Cervical / Trunk Assessment: Normal  Communication   Communication: No difficulties  Cognition  Arousal/Alertness: Awake/alert Behavior During Therapy: WFL for tasks assessed/performed Overall Cognitive Status: No family/caregiver present to determine baseline cognitive functioning                                 General Comments: oriented, but slow to respond at times to questions, increased time to follow multistep step commands with multimodal cues for completion      General Comments      Exercises     Assessment/Plan    PT Assessment Patient needs continued PT services  PT Problem List Decreased mobility;Decreased safety awareness;Decreased balance;Decreased activity tolerance;Decreased knowledge of use of DME       PT Treatment Interventions DME instruction;Functional mobility training;Balance training;Patient/family education;Therapeutic activities;Therapeutic exercise;Stair training    PT Goals (Current goals can be found in the Care Plan section)  Acute Rehab PT Goals Patient Stated Goal: To return home PT Goal Formulation: With patient Time For Goal Achievement: 09/11/17 Potential to Achieve Goals: Good    Frequency Min 3X/week   Barriers to discharge        Co-evaluation               AM-PAC PT "6 Clicks" Daily Activity  Outcome Measure Difficulty turning over in bed (including adjusting bedclothes, sheets and blankets)?: None Difficulty moving from lying on back to sitting on the side of the bed? : None Difficulty sitting down on and standing up from a chair with arms (e.g., wheelchair, bedside commode, etc,.)?: A Little Help needed moving to and from a bed to chair (including a wheelchair)?: A Little Help needed walking in hospital room?: A Little Help needed climbing 3-5 steps with a railing? : A Little 6 Click Score: 20    End of Session Equipment Utilized During Treatment: Gait belt Activity Tolerance: Patient tolerated treatment well Patient left: in bed;with call bell/phone within reach;with bed alarm set   PT Visit  Diagnosis: Other symptoms and signs involving the nervous system (R29.898);Muscle weakness (generalized) (M62.81)    Time: 4035-2481 PT Time Calculation (min) (ACUTE ONLY): 25 min   Charges:   PT Evaluation $PT Eval Moderate Complexity: 1 Mod PT Treatments $Gait Training: 8-22 mins   PT G CodesMagda [name], [place] [phone] 09/04/2017   Samantha [name] 09/04/2017, 10:49 AM

## 2017-09-04 NOTE — Progress Notes (Signed)
This CM met with pt at bedside for disposition needs. Pt did well with physical therapy and they recommended supervision intermittently.  Pt states that she has help available to her all the time and does not need home health services.  Marney Doctor RN,BSN,NCM [phone]

## 2017-09-04 NOTE — Progress Notes (Addendum)
ANTICOAGULATION CONSULT NOTE - Follow Up Consult  Pharmacy Consult for Heparin Indication: DVT  No Known Allergies  Patient Measurements: Height: 5\' 1"  (154.9 cm) Weight: 156 lb 1.4 oz (70.8 kg) IBW/kg (Calculated) : 47.8 Heparin Dosing Weight: 63.1 kg  Vital Signs: Temp: 98 F (36.7 C) (02/04 1538) Temp Source: Oral (02/04 1538) BP: 124/66 (02/04 1648) Pulse Rate: 70 (02/04 1648)  Labs: Recent Labs    Sep 17, 2017 2054  09/02/17 0432 09/02/17 1431 09/03/17 0015 09/03/17 1027 09/03/17 1820 09/04/17 0001  HGB  --    < > 12.2  --  12.6  --   --  12.4  HCT  --   --  37.9  --  38.0  --   --  37.3  PLT  --   --  207  --  230  --   --  263  APTT 57*  --  114* 122*  --   --   --   --   HEPARINUNFRC 0.41  --  0.95* 1.12* 0.76* 0.71* 0.41 0.25*  CREATININE  --   --  0.80  --   --   --   --   --    < > = values in this interval not displayed.    Estimated Creatinine Clearance: 59.7 mL/min (by C-G formula based on SCr of 0.8 mg/dL).   Medications:  Infusions:  . sodium chloride    . heparin      Assessment: 72 yo F presents on 1/31 with R pleural effusion and lung lesion. Was on Xarelto PTA for R arm DVT, but only on the second week of treatment dosing. Xarelto was held for thoracentesis on 2/1; last Xarelto dose was 1/31 at 0800.  Pharmacy is consulted to dose heparin while Xarelto on hold.  Today, 09/04/2017:  LP performed today, heparin held at 0829 this AM in preparation for procedure  Per MD, resume IV heparin infusion at 2000 tonight without bolus  CBC: Hgb and Pltc remain stable/WNL  No bleeding reported  Goal of Therapy:  Heparin level 0.3-0.7 units/mL Monitor platelets by anticoagulation protocol: Yes   Plan:  Resume IV heparin infusion at previous rate of 750 units/hr at 2000 Heparin level 6 hours after heparin infusion resumed Daily heparin level and CBC while on heparin infusion Continue to monitor for s/sx of bleeding   Lindell Spar, PharmD,  BCPS Pager: [phone] 09/04/2017 4:53 PM

## 2017-09-05 ENCOUNTER — Ambulatory Visit
Admit: 2017-09-05 | Discharge: 2017-09-05 | Disposition: A | Discharge status: Home / Self Care | Payer: Medicare HMO | Source: Ambulatory Visit | Attending: Radiation Oncology | Admitting: Radiation Oncology

## 2017-09-05 DIAGNOSIS — C7949 Secondary malignant neoplasm of other parts of nervous system: Secondary | ICD-10-CM

## 2017-09-05 DIAGNOSIS — C7931 Secondary malignant neoplasm of brain: Secondary | ICD-10-CM | POA: Insufficient documentation

## 2017-09-05 DIAGNOSIS — Z510 Encounter for antineoplastic radiation therapy: Secondary | ICD-10-CM | POA: Insufficient documentation

## 2017-09-05 DIAGNOSIS — C3490 Malignant neoplasm of unspecified part of unspecified bronchus or lung: Secondary | ICD-10-CM | POA: Insufficient documentation

## 2017-09-05 DIAGNOSIS — C7951 Secondary malignant neoplasm of bone: Secondary | ICD-10-CM | POA: Insufficient documentation

## 2017-09-05 LAB — CBC
HCT: 37.6 % (ref 36.0–46.0)
Hemoglobin: 12.2 g/dL (ref 12.0–15.0)
MCH: 28.8 pg (ref 26.0–34.0)
MCHC: 32.4 g/dL (ref 30.0–36.0)
MCV: 88.7 fL (ref 78.0–100.0)
Platelets: 254 10*3/uL (ref 150–400)
RBC: 4.24 MIL/uL (ref 3.87–5.11)
RDW: 13.5 % (ref 11.5–15.5)
WBC: 9.1 10*3/uL (ref 4.0–10.5)

## 2017-09-05 LAB — HEPARIN LEVEL (UNFRACTIONATED)
HEPARIN UNFRACTIONATED: 0.35 [IU]/mL (ref 0.30–0.70)
HEPARIN UNFRACTIONATED: 0.39 [IU]/mL (ref 0.30–0.70)

## 2017-09-05 NOTE — Progress Notes (Signed)
OT Cancellation Note  Patient Details Name: ASTARIA NANEZ MRN: [id number] DOB: Oct 10, 1945   Cancelled Treatment:    Reason Eval/Treat Not Completed: Other (comment).  Pt requests that OT return later today; her granddaughter just came to visit.  Will try to check back later.    Brande Uncapher 09/05/2017, 12:46 PM  Lesle Chris, OTR/L [phone] 09/05/2017

## 2017-09-05 NOTE — Progress Notes (Addendum)
Patient Demographics:    Samantha [name], is a 72 y.o. female, DOB - 1946-01-31, XFG:[id number]  Admit date - 08/31/2017   Admitting Physician Merton Border, MD  Outpatient Primary MD for the patient is Patient, No Pcp Per  LOS - 5   Chief Complaint  Patient presents with  . Emesis        Subjective:    [name] today has no fevers, no emesis,  No chest pain, headache resolved with steroids, appetite is fair  Assessment  & Plan :    Active Problems:   Intractable nausea and vomiting   Pleural effusion   Lytic bone lesions on xray   Lung mass   Leptomeningeal metastases [facility])  Brief summary 72 year old female admitted on 08/31/2017 patient was found to have what appears to be a widely metastatic malignancy including right lower lobe mass measuring at least 6 cm with associated mediastinal lymphadenopathy, multifocal skeletal lesions including destructive lesion in the right humerus and several vertebral lesions without pathological fracture in addition to a new finding of leptomeningeal enhancement on MRI of the brain suggestive of metastatic disease there. No focal CNS disease at this time.  Transferred from [facility] to [facility] on 09/02/2017 for possible radiation therapy, she was on Xarelto for DVT . Cytology and culture from thoracentesis of 2017-09-10 neg so far, LP on 09/04/17 with clear CSF with an opening pressure of 30 cm water, cytology from CSF fluid is pending.  Patient underwent CT radiation stimulation on 09/05/2017  Plan:-  1)Metastatic lung cancer with Leptomeningeal disease---- on 09/04/17 patient underwent lumbar puncture with opening pressure of 30 cm H20, cytology from CSF pending, MRI of the thoracic and lumbar spine with and without contrast pending does not indicate additional foci of leptomeningeal disease, however it does show foci of bony metastasis, treated with  dexamethasone ,  radiation oncology consult (From Dr Sherral Hammers and Shona Simpson, PA-C) ,  Patient underwent CT radiation stimulation on 09/05/2017 prior to radiation therapy for leptomeningeal disease, cytology and culture from thoracentesis of 09/10/2017 neg so far. MRI result previously reviewed with Dr Annette Stable, neurosurgery, does not appears that patient has any urgent neurosurgical needs, if patient fail to improve with radiation therapy then she might need further neurosurgery evaluation.   2)Right humerus with multiple lytic lesions-further MRI pending, may need orthopedic consultation if impending fracture  3)Right upper extremity DVT-Xarelto on hold, c/n  IV heparin for now, Resume Xarelto once surgical procedures are completed  4)Headaches with nausea and vomiting-secondary to leptomeningeal disease in the setting of metastatic lung cancer, improved with Decadron  5)Social/Ethics-given intracranial findings/mets and need for radiation therapy, patient and patient's son concerned about cognitive deficits, patient would like her son to Become her HCPOA, pastoral  services and case management consult requested to help facilitate advanced directive and power of attorney documentation/paperwork. D/w Pt's son Mr. Keirstin Musil - [phone] (Mr D. Kirley..... 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 Lebron Conners, neurosurgery Dr. Annette Stable, radiation oncology per Dr. Cornelia Copa (  D/w DR Isidore Moos)  DVT Prophylaxis  : iv Heparin   Lab Results  Component Value Date   PLT 254 09/05/2017    Inpatient Medications  Scheduled Meds: . bisacodyl  10 mg Rectal Once  . dexamethasone  8 mg Intravenous Q8H  . feeding supplement (ENSURE ENLIVE)  237 mL Oral BID BM  . polyethylene glycol  17 g Oral BID  . senna  1 tablet Oral Daily  . sodium chloride flush  3  mL Intravenous Q12H   Continuous Infusions: . sodium chloride    . heparin 750 Units/hr (09/04/17 2049)   PRN Meds:.sodium chloride, acetaminophen **OR** acetaminophen, albuterol, lidocaine, morphine injection, ondansetron **OR** ondansetron (ZOFRAN) IV, oxyCODONE, sodium chloride flush, zolpidem    Anti-infectives (From admission, onward)   None        Objective:   Vitals:   09/04/17 1648 09/04/17 2035 09/05/17 0436 09/05/17 1715  BP: 124/66 135/70 (!) 171/86 137/76  Pulse: 70 85 71 75  Resp: 18 12 16 18   Temp:  98.4 F (36.9 C) (!) 97.5 F (36.4 C) 98.1 F (36.7 C)  TempSrc:  Oral Oral Oral  SpO2: 97% 96% 97% 98%  Weight:      Height:        Wt Readings from Last 3 Encounters:  09/02/17 70.8 kg (156 lb 1.4 oz)  08/07/17 78.5 kg (173 lb)  05/18/16 78.5 kg (173 lb)     Intake/Output Summary (Last 24 hours) at 09/05/2017 1826 Last data filed at 09/05/2017 0400 Gross per 24 hour  Intake 53.88 ml  Output -  Net 53.88 ml   Physical Exam  Gen:- Awake Alert, in no apparent distress, complains of right shoulder pain HEENT:- [place].AT, No sclera icterus Neck-Supple Neck,No JVD,.  Lungs-  CTAB  CV- S1, S2 normal Abd-  +ve B.Sounds, Abd Soft, No tenderness,    Extremity/Skin:- No  edema,    Psych-affect is appropriate Neuro-generalized weakness without new focal deficits    Data Review:   Micro Results Recent Results (from the past 240 hour(s))  Acid Fast Smear (AFB)     Status: None   Collection Time: 09/03/2017  9:21 AM  Result Value Ref Range Status   AFB Specimen Processing Concentration  Final   Acid Fast Smear Negative  Final    Comment: (NOTE) Performed At: [facility] [place], [place] [id number] Rush Farmer MD PY:[id number]    Source (AFB) PLEURAL  Final    Comment: RIGHT Performed at [facility], [place] [address] [place], [place] [address]   Culture, body fluid-bottle     Status: None (Preliminary result)   Collection  Time: 09/03/2017  9:21 AM  Result Value Ref Range Status   Specimen Description PLEURAL RIGHT  Final   Special Requests NONE  Final   Culture   Final    NO GROWTH 4 DAYS Performed at [place] [address], [place], [place] [address]    Report Status PENDING  Incomplete  Gram stain     Status: None   Collection Time: 2017/09/03  9:21 AM  Result Value Ref Range Status   Specimen Description PLEURAL RIGHT  Final   Special Requests NONE  Final   Gram Stain   Final    RARE WBC PRESENT, PREDOMINANTLY MONONUCLEAR NO ORGANISMS SEEN Performed at [facility], [address] [place],  [address]    Report Status Sep 03, 2017 FINAL  Final    Radiology Reports  Dg Chest 1 View  Result Date: 2017-09-15 CLINICAL DATA:  Thoracentesis. EXAM: CHEST 1 VIEW COMPARISON:  CT 08/31/2016. FINDINGS: Mediastinum and hilar structures are normal. Heart size normal. Atelectatic changes right lung base. Small right pleural effusion. No pneumothorax destructive right humeral lesion best identified on CT. IMPRESSION: 1. Persistent atelectasis/mass right lung base. Reference is made to prior CT report 08/31/2016. 2.  Destructive right humeral lesion best identified on prior CT. Electronically Signed   By: Marcello Moores  Register   On: Sep 15, 2017 09:39   Ct Chest Wo Contrast  Result Date: 2017-09-15 CLINICAL DATA:  Status post thoracentesis. Evaluate right lower lobe process. EXAM: CT CHEST WITHOUT CONTRAST TECHNIQUE: Multidetector CT imaging of the chest was performed following the standard protocol without IV contrast. COMPARISON:  CT chest, abdomen and pelvis 08/31/2017 FINDINGS: Cardiovascular: The heart is normal in size. No pericardial effusion. The aorta is normal in caliber. No atherosclerotic calcifications. No significant coronary artery calcifications. Mediastinum/Nodes: Mediastinal and right hilar lymphadenopathy. Anterior mediastinal node on image number 40 measures 7 mm. 10 mm pretracheal lymph  node on image number 43. 9 mm aorticopulmonary window node on image number 43. 13 mm subcarinal lymph node on the right side on image number 57. The esophagus is grossly normal. Lungs/Pleura: Status post right-sided thoracentesis with a small residual pleural effusion. There is a 6 x 5 cm masslike lesion in the right upper lobe worrisome for neoplasm given the adenopathy and the destructive metastatic bone disease. 4 mm nodule noted at the left lung base on image number 86. Do not see any other obvious pulmonary nodules to suggest pulmonary metastatic disease. Upper Abdomen: No obvious hepatic or adrenal gland metastasis. Contrast in the gallbladder related to vicarious excretion. Musculoskeletal: No obvious breast masses, supraclavicular or axillary adenopathy. Enlarged multinodular thyroid goiter is noted. Again demonstrated is lytic metastatic disease involving the thoracic vertebral bodies, a few ribs and possibly the sternum. T7 is the most affected but no definite canal encroachment. MRI thoracic spine may be helpful for further evaluation. IMPRESSION: 1. Status post right thoracentesis. 6 x 5 cm masslike lesion in the right lower lobe worrisome for primary lung neoplasm given the mediastinal and right hilar adenopathy and diffuse osseous metastatic disease. Recommend correlation with recent right pleural fluid. PET-CT may be helpful to assess for safest biopsy route. 2. 4 mm left lower lobe pulmonary nodules but no other definite pulmonary lesions. 3. No definite upper abdominal metastatic disease. 4. Multinodular thyroid goiter. 5. No obvious breast masses. Electronically Signed   By: Marijo Sanes M.D.   On: 09-15-2017 16:27   Ct Angio Chest Pe W And/or Wo Contrast  Result Date: 08/31/2017 CLINICAL DATA:  Vomiting, chills since this morning. Right-sided body pain. Pulmonary embolism suspected, high pretest probability. EXAM: CT ANGIOGRAPHY CHEST CT ABDOMEN AND PELVIS WITH CONTRAST TECHNIQUE:  Multidetector CT imaging of the chest was performed using the standard protocol during bolus administration of intravenous contrast. Multiplanar CT image reconstructions and MIPs were obtained to evaluate the vascular anatomy. Multidetector CT imaging of the abdomen and pelvis was performed using the standard protocol during bolus administration of intravenous contrast. CONTRAST:  133mL ISOVUE-370 IOPAMIDOL (ISOVUE-370) INJECTION 76% COMPARISON:  None. FINDINGS: CTA CHEST FINDINGS Cardiovascular: Some of the most peripheral segmental and subsegmental pulmonary arteries are difficult to definitively characterize due to mild patient breathing motion artifact, however, there is no pulmonary embolism identified within the main, lobar or central segmental pulmonary arteries bilaterally. Heart size is normal. No pericardial effusion. Thoracic  aorta is normal in caliber and configuration. No aneurysm or evidence of dissection. Mediastinum/Nodes: Scattered small and mildly prominent lymph nodes within the mediastinum and right hilum. Esophagus appears normal. Trachea and central bronchi are unremarkable. Lungs/Pleura: Right pleural effusion, moderate to large in size, with adjacent compressive atelectasis. Associated edema within the right upper lobe and right lower lobe. Left lung is clear. Musculoskeletal: Destructive changes of the proximal right humerus, incompletely imaged. Additional lytic foci within the T6 and T7 vertebral bodies, consistent with metastases. Probable additional lytic lesion within the medial aspects of the left tenth rib. Review of the MIP images confirms the above findings. CT ABDOMEN and PELVIS FINDINGS Hepatobiliary: Tiny hypodense foci within the left liver lobe, much too small to definitively characterize, most likely small cysts. Gallbladder appears normal. No bile duct dilatation. Pancreas: Unremarkable. No pancreatic ductal dilatation or surrounding inflammatory changes. Spleen: Normal in  size without focal abnormality. Adrenals/Urinary Tract: Adrenal glands are unremarkable. Kidneys are normal, without renal calculi, focal lesion, or hydronephrosis. Bladder is unremarkable. Stomach/Bowel: Bowel is normal in caliber. No bowel wall thickening or evidence of bowel wall inflammation. Appendix is normal. Stomach is unremarkable, partially decompressed. Vascular/Lymphatic: No significant vascular findings are present. No enlarged abdominal or pelvic lymph nodes. Reproductive: Status post hysterectomy. No adnexal masses. Other: No free fluid or abscess collection. No free intraperitoneal air. No soft tissue mass seen. Musculoskeletal: Subtle small lucency within the right inferior pubic ramus, suspicious for additional metastasis. Similar lytic lesion within the upper sacrum, near the midline, again suspicious for metastasis. More convincing metastases are seen within the L4 vertebral body and right iliac bone. Review of the MIP images confirms the above findings. IMPRESSION: 1. Right pleural effusion, moderate to large in size, with adjacent compressive atelectasis. Associated pulmonary edema within the right upper lobe and right lower lobe. Left lung is clear. 2. Heterogeneous low-density areas within the atelectasis at the right lung base, suspicious for neoplastic mass obscured by the surrounding atelectasis, a possible primary carcinoma for the osseous metastases described below. 3. No pulmonary embolism. 4. Destructive changes of the proximal right humerus, almost certainly neoplastic, metastatic versus primary carcinoma, incompletely imaged. Additional metastases identified within the thoracic spine, lumbar spine and osseous pelvis, as detailed above. 5. Mild lymphadenopathy within the mediastinum and right hilum, most likely reactive, but metastatic lymphadenopathy certainly not excluded given the osseous metastases described above. 6. No acute intra-abdominal or intrapelvic abnormality. No bowel  obstruction or evidence of bowel wall inflammation. No evidence of acute solid organ abnormality. Appendix is normal. These results were called by telephone at the time of interpretation on 08/31/2017 at 8:14 pm to Dr. Blanchie Dessert , who verbally acknowledged these results. Electronically Signed   By: Franki Cabot M.D.   On: 08/31/2017 20:14   Mr Jeri Cos EV Contrast  Result Date: 09/02/2017 CLINICAL DATA:  Neck pain. Possible thoracic neoplasm. Pleural effusion. Lytic bone lesions. Intractable nausea and vomiting. EXAM: MRI HEAD WITHOUT AND WITH CONTRAST MRI CERVICAL SPINE WITHOUT AND WITH CONTRAST TECHNIQUE: Multiplanar, multiecho pulse sequences of the brain and surrounding structures, and cervical spine, to include the craniocervical junction and cervicothoracic junction, were obtained without and with intravenous contrast. CONTRAST:  68mL MULTIHANCE GADOBENATE DIMEGLUMINE 529 MG/ML IV SOLN COMPARISON:  CT chest 08/31/2017. FINDINGS: MRI HEAD FINDINGS Brain: No restricted diffusion, or hemorrhage.  No intra-axial mass. Communicating hydrocephalus, with mild enlargement of the lateral, third, and fourth ventricles. Abnormal FLAIR signal in the CSF over the convexity, RIGHT greater  than LEFT, is accompanied by abnormal enhancement in the subarachnoid space, consistent with leptomeningeal spread of tumor. Reference image 43 series 23; similar findings in the posterior fossa are less impressive, for instance on image 11 series 23. There is mild superimposed atrophy. There is mild chronic microvascular ischemic change in addition to transependymal absorption. No midline shift. No extra-axial fluid. No definite discrete parenchymal metastases although post infusion imaging is mildly motion degraded. Vascular: Flow voids are maintained. Skull and upper cervical spine: Normal marrow signal. Sinuses/Orbits: Slight layering fluid in the sphenoid. Negative orbits. Other: None. MRI CERVICAL SPINE FINDINGS Alignment:  Anatomic. Vertebrae: No worrisome osseous lesion. Cord: Normal signal and morphology. No abnormal enhancement of the cord or surrounding meninges. Posterior Fossa, vertebral arteries, paraspinal tissues: Vertebral arteries are patent. LEFT greater than RIGHT thyroid gland enlargement, likely goiter. Consider CT neck with contrast for further characterization. Abnormal enhancement in the posterior fossa, particularly surrounding the LEFT cerebellar tonsil. Disc levels: No disc protrusion or spinal stenosis. 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 This is most prominent over the cerebral convexity on the RIGHT. See discussion above. No definite parenchymal metastases, increased pressure, or midline shift. Unremarkable cervical spine MRI imaging. Electronically Signed   By: Staci Righter M.D.   On: 09/02/2017 14:50   Mr Cervical Spine W Wo Contrast  Result Date: 09/02/2017 CLINICAL DATA:  Neck pain. Possible thoracic neoplasm. Pleural effusion. Lytic bone lesions. Intractable nausea and vomiting. EXAM: MRI HEAD WITHOUT AND WITH CONTRAST MRI CERVICAL SPINE WITHOUT AND WITH CONTRAST TECHNIQUE: Multiplanar, multiecho pulse sequences of the brain and surrounding structures, and cervical spine, to include the craniocervical junction and cervicothoracic junction, were obtained without and with intravenous contrast. CONTRAST:  66mL MULTIHANCE GADOBENATE DIMEGLUMINE 529 MG/ML IV SOLN COMPARISON:  CT chest 08/31/2017. FINDINGS: MRI HEAD FINDINGS Brain: No restricted diffusion, or hemorrhage.  No intra-axial mass. Communicating hydrocephalus, with mild enlargement of the lateral, third, and fourth ventricles. Abnormal FLAIR signal in the CSF over the convexity, RIGHT greater than LEFT, is accompanied by abnormal enhancement in the subarachnoid space, consistent with leptomeningeal spread of tumor. Reference image 43 series 23; similar findings in the posterior  fossa are less impressive, for instance on image 11 series 23. There is mild superimposed atrophy. There is mild chronic microvascular ischemic change in addition to transependymal absorption. No midline shift. No extra-axial fluid. No definite discrete parenchymal metastases although post infusion imaging is mildly motion degraded. Vascular: Flow voids are maintained. Skull and upper cervical spine: Normal marrow signal. Sinuses/Orbits: Slight layering fluid in the sphenoid. Negative orbits. Other: None. MRI CERVICAL SPINE FINDINGS Alignment: Anatomic. Vertebrae: No worrisome osseous lesion. Cord: Normal signal and morphology. No abnormal enhancement of the cord or surrounding meninges. Posterior Fossa, vertebral arteries, paraspinal tissues: Vertebral arteries are patent. LEFT greater than RIGHT thyroid gland enlargement, likely goiter. Consider CT neck with contrast for further characterization. Abnormal enhancement in the posterior fossa, particularly surrounding the LEFT cerebellar tonsil. Disc levels: No disc protrusion or spinal stenosis. 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 This is most prominent over the cerebral convexity on the RIGHT. See discussion above. No definite parenchymal metastases, increased pressure, or midline shift. Unremarkable cervical spine MRI imaging. Electronically Signed   By: Staci Righter M.D.   On: 09/02/2017 14:50   Mr Thoracic Spine W Wo Contrast  Result Date: 09/04/2017 CLINICAL DATA:  Widely metastatic malignancy, newly diagnosed. EXAM: MRI THORACIC AND LUMBAR SPINE WITHOUT AND  WITH CONTRAST TECHNIQUE: Multiplanar and multiecho pulse sequences of the thoracic and lumbar spine were obtained without and with intravenous contrast. CONTRAST:  52mL MULTIHANCE GADOBENATE DIMEGLUMINE 529 MG/ML IV SOLN COMPARISON:  CT chest, abdomen, and pelvis 08/31/2017 FINDINGS: MRI THORACIC SPINE FINDINGS Alignment:  Normal.  Vertebrae: Multiple osseous metastases as seen on recent CT, the largest measuring approximately 1.7 cm in the left half of the T6 vertebral body. A 1 cm lesion is present in the T4 vertebral body. Scattered smaller vertebral body and posterior element lesions are noted. No epidural or other extraosseous tumor is identified. Vertebral body heights are preserved without a fracture identified. A posteromedial left tenth rib lesion is also again noted. Cord: Normal signal and morphology. No abnormal intradural enhancement. Paraspinal and other soft tissues: Multinodular thyroid goiter. Right pleural effusion, suspected right lower lobe lung mass, and thoracic lymphadenopathy more fully evaluated on recent chest CT. Disc levels: Mild thoracic spondylosis with only minimal disc bulging in the midthoracic spine. No sizable focal disc herniation, spinal stenosis, or neural foraminal stenosis. MRI LUMBAR SPINE FINDINGS Segmentation:  Standard. Alignment:  Normal. Vertebrae: L4 and L5 vertebral body enhancing metastases measuring approximately 2 cm each. Smaller enhancing lesions in the L1, L2, L4, and S1 vertebral bodies. Bilateral L4-5 facet enhancement is favored to be degenerative. A 5 mm sclerotic lesion is partially visualized in the posterior left ilium. Vertebral body heights are preserved without a fracture identified. No epidural or other extraosseous tumor is identified. Conus medullaris: Extends to the L1-2 level and appears normal. Normal appearance of the cauda equina without nerve root thickening or enhancement. Paraspinal and other soft tissues: Unremarkable. Disc levels: L1-2 through L3-4: Normal discs. No stenosis. Mild facet arthrosis at L3-4. L4-5: Disc desiccation. Mild disc bulging and mild facet and ligamentum flavum hypertrophy result in mild bilateral lateral recess stenosis and borderline bilateral neural foraminal stenosis. Small left foraminal annular fissure. L5-S1: Disc desiccation. Mild disc  bulging with right subarticular annular fissure. Disc approaches the S1 nerve roots in the lateral recesses but does not result in significant stenosis. IMPRESSION: 1. 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 No evidence of epidural or leptomeningeal spinal disease. 2. Mild lower lumbar disc and facet degeneration. Mild lateral recess stenosis at L4-5. Electronically Signed   By: Logan Bores M.D.   On: 09/04/2017 14:55   Mr Lumbar Spine W Wo Contrast  Result Date: 09/04/2017 CLINICAL DATA:  Widely metastatic malignancy, newly diagnosed. EXAM: MRI THORACIC AND LUMBAR SPINE WITHOUT AND WITH CONTRAST TECHNIQUE: Multiplanar and multiecho pulse sequences of the thoracic and lumbar spine were obtained without and with intravenous contrast. CONTRAST:  84mL MULTIHANCE GADOBENATE DIMEGLUMINE 529 MG/ML IV SOLN COMPARISON:  CT chest, abdomen, and pelvis 08/31/2017 FINDINGS: MRI THORACIC SPINE FINDINGS Alignment:  Normal. Vertebrae: Multiple osseous metastases as seen on recent CT, the largest measuring approximately 1.7 cm in the left half of the T6 vertebral body. A 1 cm lesion is present in the T4 vertebral body. Scattered smaller vertebral body and posterior element lesions are noted. No epidural or other extraosseous tumor is identified. Vertebral body heights are preserved without a fracture identified. A posteromedial left tenth rib lesion is also again noted. Cord: Normal signal and morphology. No abnormal intradural enhancement. Paraspinal and other soft tissues: Multinodular thyroid goiter. Right pleural effusion, suspected right lower lobe lung mass, and thoracic lymphadenopathy more fully evaluated on recent chest CT. Disc levels: Mild thoracic spondylosis with only minimal disc bulging  in the midthoracic spine. No sizable focal disc herniation, spinal stenosis, or neural foraminal stenosis. MRI LUMBAR SPINE FINDINGS Segmentation:  Standard. Alignment:  Normal.  Vertebrae: L4 and L5 vertebral body enhancing metastases measuring approximately 2 cm each. Smaller enhancing lesions in the L1, L2, L4, and S1 vertebral bodies. Bilateral L4-5 facet enhancement is favored to be degenerative. A 5 mm sclerotic lesion is partially visualized in the posterior left ilium. Vertebral body heights are preserved without a fracture identified. No epidural or other extraosseous tumor is identified. Conus medullaris: Extends to the L1-2 level and appears normal. Normal appearance of the cauda equina without nerve root thickening or enhancement. Paraspinal and other soft tissues: Unremarkable. Disc levels: L1-2 through L3-4: Normal discs. No stenosis. Mild facet arthrosis at L3-4. L4-5: Disc desiccation. Mild disc bulging and mild facet and ligamentum flavum hypertrophy result in mild bilateral lateral recess stenosis and borderline bilateral neural foraminal stenosis. Small left foraminal annular fissure. L5-S1: Disc desiccation. Mild disc bulging with right subarticular annular fissure. Disc approaches the S1 nerve roots in the lateral recesses but does not result in significant stenosis. IMPRESSION: 1. 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 No evidence of epidural or leptomeningeal spinal disease. 2. Mild lower lumbar disc and facet degeneration. Mild lateral recess stenosis at L4-5. Electronically Signed   By: Logan Bores M.D.   On: 09/04/2017 14:55   Mr Humerus Right W Wo Contrast  Result Date: 09/04/2017 CLINICAL DATA:  Lytic lesion of the humerus. EXAM: MRI OF THE RIGHT HUMERUS WITHOUT AND WITH CONTRAST TECHNIQUE: Multiplanar, multisequence MR imaging of the right humerus was performed before and after the administration of intravenous contrast. CONTRAST:  15 cc MultiHance intravenous contrast. COMPARISON:  CT chest dated 2017/09/25. Right shoulder x-rays dated July 29, 2017. FINDINGS: Bones/Joint/Cartilage There is a large,  destructive, enhancing marrow replacing lesion in the proximal humeral metadiaphysis with associated periosteal reaction. The craniocaudal length of involved bone measures approximately 10 cm and extends to the mid humeral shaft. No pathologic fracture. No additional marrow replacing lesion seen. Small glenohumeral joint effusion. Muscles and Tendons Faint muscle edema in the proximal subscapularis and infraspinatus muscles. Small interstitial tear at the infraspinatus myotendinous junction. Soft tissues Mild soft tissue inflammatory changes surrounding the osseous metastasis. Partially visualized small right pleural effusion and mass-like consolidation in the right lower lobe. IMPRESSION: 1. Large, destructive, enhancing marrow replacing lesion in the proximal humeral metadiaphysis, consistent with osseous metastasis. No pathologic fracture. 2. Partially visualized small right pleural effusion and mass-like consolidation in the right lower lobe, unchanged. Electronically Signed   By: Titus Dubin M.D.   On: 09/04/2017 14:27   Ct Abdomen Pelvis W Contrast  Result Date: 08/31/2017 CLINICAL DATA:  Vomiting, chills since this morning. Right-sided body pain. Pulmonary embolism suspected, high pretest probability. EXAM: CT ANGIOGRAPHY CHEST CT ABDOMEN AND PELVIS WITH CONTRAST TECHNIQUE: Multidetector CT imaging of the chest was performed using the standard protocol during bolus administration of intravenous contrast. Multiplanar CT image reconstructions and MIPs were obtained to evaluate the vascular anatomy. Multidetector CT imaging of the abdomen and pelvis was performed using the standard protocol during bolus administration of intravenous contrast. CONTRAST:  129mL ISOVUE-370 IOPAMIDOL (ISOVUE-370) INJECTION 76% COMPARISON:  None. FINDINGS: CTA CHEST FINDINGS Cardiovascular: Some of the most peripheral segmental and subsegmental pulmonary arteries are difficult to definitively characterize due to mild patient  breathing motion artifact, however, there is no pulmonary embolism identified within the main, lobar or  central segmental pulmonary arteries bilaterally. Heart size is normal. No pericardial effusion. Thoracic aorta is normal in caliber and configuration. No aneurysm or evidence of dissection. Mediastinum/Nodes: Scattered small and mildly prominent lymph nodes within the mediastinum and right hilum. Esophagus appears normal. Trachea and central bronchi are unremarkable. Lungs/Pleura: Right pleural effusion, moderate to large in size, with adjacent compressive atelectasis. Associated edema within the right upper lobe and right lower lobe. Left lung is clear. Musculoskeletal: Destructive changes of the proximal right humerus, incompletely imaged. Additional lytic foci within the T6 and T7 vertebral bodies, consistent with metastases. Probable additional lytic lesion within the medial aspects of the left tenth rib. Review of the MIP images confirms the above findings. CT ABDOMEN and PELVIS FINDINGS Hepatobiliary: Tiny hypodense foci within the left liver lobe, much too small to definitively characterize, most likely small cysts. Gallbladder appears normal. No bile duct dilatation. Pancreas: Unremarkable. No pancreatic ductal dilatation or surrounding inflammatory changes. Spleen: Normal in size without focal abnormality. Adrenals/Urinary Tract: Adrenal glands are unremarkable. Kidneys are normal, without renal calculi, focal lesion, or hydronephrosis. Bladder is unremarkable. Stomach/Bowel: Bowel is normal in caliber. No bowel wall thickening or evidence of bowel wall inflammation. Appendix is normal. Stomach is unremarkable, partially decompressed. Vascular/Lymphatic: No significant vascular findings are present. No enlarged abdominal or pelvic lymph nodes. Reproductive: Status post hysterectomy. No adnexal masses. Other: No free fluid or abscess collection. No free intraperitoneal air. No soft tissue mass seen.  Musculoskeletal: Subtle small lucency within the right inferior pubic ramus, suspicious for additional metastasis. Similar lytic lesion within the upper sacrum, near the midline, again suspicious for metastasis. More convincing metastases are seen within the L4 vertebral body and right iliac bone. Review of the MIP images confirms the above findings. IMPRESSION: 1. Right pleural effusion, moderate to large in size, with adjacent compressive atelectasis. Associated pulmonary edema within the right upper lobe and right lower lobe. Left lung is clear. 2. Heterogeneous low-density areas within the atelectasis at the right lung base, suspicious for neoplastic mass obscured by the surrounding atelectasis, a possible primary carcinoma for the osseous metastases described below. 3. No pulmonary embolism. 4. Destructive changes of the proximal right humerus, almost certainly neoplastic, metastatic versus primary carcinoma, incompletely imaged. Additional metastases identified within the thoracic spine, lumbar spine and osseous pelvis, as detailed above. 5. Mild lymphadenopathy within the mediastinum and right hilum, most likely reactive, but metastatic lymphadenopathy certainly not excluded given the osseous metastases described above. 6. No acute intra-abdominal or intrapelvic abnormality. No bowel obstruction or evidence of bowel wall inflammation. No evidence of acute solid organ abnormality. Appendix is normal. These results were called by telephone at the time of interpretation on 08/31/2017 at 8:14 pm to Dr. Blanchie Dessert , who verbally acknowledged these results. Electronically Signed   By: Franki Cabot M.D.   On: 08/31/2017 20:14   Ir Thoracentesis Asp Pleural Space W/img Guide  Result Date: 09/13/2017 INDICATION: Patient with a right humeral lytic lesion along with a right lung lesion and a right pleural effusion. Request is made for diagnostic and therapeutic thoracentesis. EXAM: ULTRASOUND GUIDED DIAGNOSTIC  AND THERAPEUTIC THORACENTESIS MEDICATIONS: 2% lidocaine COMPLICATIONS: None immediate. PROCEDURE: An ultrasound guided thoracentesis was thoroughly discussed with the patient and questions answered. The benefits, risks, alternatives and complications were also discussed. The patient understands and wishes to proceed with the procedure. Written consent was obtained. Ultrasound was performed to localize and mark an adequate pocket of fluid in the right chest. The area was then  prepped and draped in the normal sterile fashion. 2% Lidocaine was used for local anesthesia. Under ultrasound guidance a Safe-T-Centesis catheter was introduced. Thoracentesis was performed. The catheter was removed and a dressing applied. FINDINGS: A total of approximately 0.45 L of serous fluid was removed. Samples were sent to the laboratory as requested by the clinical team. IMPRESSION: Successful ultrasound guided right thoracentesis yielding 0.45 L of pleural fluid. Read by: Saverio Danker, PA-C Electronically Signed   By: Sandi Mariscal M.D.   On: September 07, 2017 09:58   Dg Fluoro Guide Lumbar Puncture  Result Date: 09/04/2017 CLINICAL DATA:  Leptomeningeal disease, metastasis EXAM: DIAGNOSTIC LUMBAR PUNCTURE UNDER FLUOROSCOPIC GUIDANCE FLUOROSCOPY TIME:  Fluoroscopy Time:  0.22 min Radiation Exposure Index (if provided by the fluoroscopic device): 7.84 mGy Number of Acquired Spot Images: 0 PROCEDURE: Informed consent was obtained from the patient prior to the procedure, including potential complications of headache, allergy, and pain. With the patient prone, the lower back was prepped with Betadine. 1% Lidocaine was used for local anesthesia. Lumbar puncture was performed at the L3-4 level using a 20 gauge needle with return of clear CSF with an opening pressure of 30 cm water. 10 ml of CSF were obtained for laboratory studies. The patient tolerated the procedure well and there were no apparent complications. IMPRESSION: Successful lumbar  puncture under fluoroscopic guidance as described above. Electronically Signed   By: Rolm Baptise M.D.   On: 09/04/2017 15:29     CBC Recent Labs  Lab 09-07-2017 0551 09/02/17 0432 09/03/17 0015 09/04/17 0001 09/05/17 0423  WBC 6.3 5.6 5.4 12.5* 9.1  HGB 12.3 12.2 12.6 12.4 12.2  HCT 37.5 37.9 38.0 37.3 37.6  PLT 223 207 230 263 254  MCV 88.4 89.6 87.4 87.6 88.7  MCH 29.0 28.8 29.0 29.1 28.8  MCHC 32.8 32.2 33.2 33.2 32.4  RDW 13.8 14.0 13.4 13.6 13.5    Chemistries  Recent Labs  Lab 08/31/17 1446 09/02/17 0432  NA 136 136  K 4.1 4.6  CL 103 103  CO2 20* 21*  GLUCOSE 115* 111*  BUN 12 14  CREATININE 0.79 0.80  CALCIUM 9.6 9.2  AST 20  --   ALT 14  --   ALKPHOS 107  --   BILITOT 0.6  --    ------------------------------------------------------------------------------------------------------------------ No results for input(s): CHOL, HDL, LDLCALC, TRIG, CHOLHDL, LDLDIRECT in the last 72 hours.  No results found for: HGBA1C ------------------------------------------------------------------------------------------------------------------ No results for input(s): TSH, T4TOTAL, T3FREE, THYROIDAB in the last 72 hours.  Invalid input(s): FREET3 ------------------------------------------------------------------------------------------------------------------ No results for input(s): VITAMINB12, FOLATE, FERRITIN, TIBC, IRON, RETICCTPCT in the last 72 hours.  Coagulation profile No results for input(s): INR, PROTIME in the last 168 hours.  No results for input(s): DDIMER in the last 72 hours.  Cardiac Enzymes No results for input(s): CKMB, TROPONINI, MYOGLOBIN in the last 168 hours.  Invalid input(s): CK ------------------------------------------------------------------------------------------------------------------ No results found for: BNP   Roxan Hockey M.D on 09/05/2017 at 6:26 PM  Between 7am to 7pm - Pager - [phone]  After 7pm go to [email or web address] -  password [email or web address]  [facility] -  Office  [phone]   Voice Recognition Viviann Spare dictation system was used to create this note, attempts have been made to correct errors. Please contact the author with questions and/or clarifications.

## 2017-09-05 NOTE — Care Management Important Message (Signed)
Important Message  Patient Details  Name: Samantha [name] MRN: [id number] Date of Birth: 02-26-1946   Medicare Important Message Given:  Yes    Kerin Salen 09/05/2017, 11:03 AMImportant Message  Patient Details  Name: Samantha [name] MRN: [id number] Date of Birth: 08-25-1945   Medicare Important Message Given:  Yes    Kerin Salen 09/05/2017, 11:03 AM

## 2017-09-05 NOTE — Progress Notes (Signed)
CM consult for HCPOA. This CM inquired with pt about this and pt states that she does not want to do this at this time. She would like to wait until she gets out of the hospital. Marney Doctor RN,BSN,NCM [phone]

## 2017-09-05 NOTE — Evaluation (Signed)
Occupational Therapy Evaluation Patient Details Name: Samantha [name] MRN: [id number] DOB: Dec 15, 1945 Today's Date: 09/05/2017    History of Present Illness 72 year old female admitted on 08/31/2017 patient was found to have what appears to be a widely metastatic malignancy including right lower lobe mass measuring at least 6 cm with associated mediastinal lymphadenopathy, multifocal skeletal lesions including destructive lesion in the right humerus and several vertebral lesions without pathological fracture in addition to a new finding of leptomeningeal enhancement on MRI of the brain suggestive of metastatic disease there.    Clinical Impression   Pt was admitted for the above.  She lived alone and was independent with basic adls prior to admission.  Pt currently needs min guard for ambulating, mod to max A for LB adls and fatiques very easily.  Had some confusion and needed safety cues today. Goals are for mod I to supervision level.      Follow Up Recommendations  Supervision/Assistance - 24 hour;SNF(vs) HHOT if she returns home and has another service:  OT is not a stand alone service   Equipment Recommendations  (may need tub seat/bench vs sponge bathing)    Recommendations for Other Services       Precautions / Restrictions Precautions Precautions: Fall Restrictions Weight Bearing Restrictions: No      Mobility Bed Mobility Overal bed mobility: Modified Independent             General bed mobility comments: HOB raised and extra time  Transfers Overall transfer level: Modified independent               General transfer comment: extra time    Balance                                           ADL either performed or assessed with clinical judgement   ADL Overall ADL's : Needs assistance/impaired     Grooming: Supervision/safety;Standing   Upper Body Bathing: Minimal assistance;Sitting   Lower Body Bathing: Moderate assistance;Sit  to/from stand   Upper Body Dressing : Minimal assistance;Sitting(lines)   Lower Body Dressing: Maximal assistance;Sit to/from stand   Toilet Transfer: Min guard;Ambulation;Comfort height toilet             General ADL Comments: pt fatiques easily. Ambulated to bathroom without an AD.  Pt tended to furniture walk very slowly. Closed eyes at one point and said nothing was wrong:  cued her to keep eyes open when walking.       Vision         Perception     Praxis      Pertinent Vitals/Pain Pain Assessment: No/denies pain     Hand Dominance     Extremity/Trunk Assessment Upper Extremity Assessment Upper Extremity Assessment: RUE deficits/detail RUE Deficits / Details: limited ROM during ADL; AROM only 20 degrees           Communication Communication Communication: No difficulties   Cognition Arousal/Alertness: Awake/alert Behavior During Therapy: WFL for tasks assessed/performed                                   General Comments: pt confused:  washed in bathroom and thought she had washed XRT markings off; dry skin was on washcloth.  Cues for safety when walking to bathroom; cues for thoroughness   General  Comments       Exercises     Shoulder Instructions      Home Living Family/patient expects to be discharged to:: Private residence Living Arrangements: Alone Available Help at Discharge: Family;Available PRN/intermittently               Bathroom Shower/Tub: Teacher, early years/pre: Handicapped height     Home Equipment: None          Prior Functioning/Environment Level of Independence: Independent                 OT Problem List: Decreased strength;Decreased range of motion;Decreased activity tolerance;Decreased knowledge of use of DME or AE;Impaired UE functional use      OT Treatment/Interventions: Self-care/ADL training;DME and/or AE instruction;Patient/family education;Therapeutic  activities;Therapeutic exercise;Energy conservation    OT Goals(Current goals can be found in the care plan section) Acute Rehab OT Goals Patient Stated Goal: To return home OT Goal Formulation: With patient Time For Goal Achievement: 09/19/17 Potential to Achieve Goals: Good ADL Goals Pt Will Transfer to Toilet: with modified independence;ambulating(comfort height) Pt Will Perform Tub/Shower Transfer: Tub transfer;with supervision;tub bench;shower seat;ambulating Additional ADL Goal #1: pt will perform adl with set up/supervision sit to stand Additional ADL Goal #2: Pt will not need any cues for safety nor continuation of task during ADLs Additional ADL Goal #3: Pt will intiate at least one rest break for energy conservation  OT Frequency: Min 2X/week   Barriers to D/C:            Co-evaluation              AM-PAC PT "6 Clicks" Daily Activity     Outcome Measure Help from another person eating meals?: None Help from another person taking care of personal grooming?: A Little Help from another person toileting, which includes using toliet, bedpan, or urinal?: A Little Help from another person bathing (including washing, rinsing, drying)?: A Lot Help from another person to put on and taking off regular upper body clothing?: A Little Help from another person to put on and taking off regular lower body clothing?: A Lot 6 Click Score: 17   End of Session    Activity Tolerance: Patient limited by fatigue Patient left: in bed;with call bell/phone within reach;with bed alarm set  OT Visit Diagnosis: Muscle weakness (generalized) (M62.81)                Time: 3762-8315 OT Time Calculation (min): 20 min Charges:  OT General Charges $OT Visit: 1 Visit OT Evaluation $OT Eval Low Complexity: 1 Low G-Codes:     [place], OTR/L [phone] 09/05/2017  Kristinia Leavy 09/05/2017, 3:50 PM

## 2017-09-05 NOTE — Progress Notes (Signed)
ANTICOAGULATION CONSULT NOTE - Follow Up Consult  Pharmacy Consult for Heparin Indication: DVT  No Known Allergies  Patient Measurements: Height: 5\' 1"  (154.9 cm) Weight: 156 lb 1.4 oz (70.8 kg) IBW/kg (Calculated) : 47.8 Heparin Dosing Weight: 63.1 kg  Vital Signs: Temp: 97.5 F (36.4 C) (02/05 0436) Temp Source: Oral (02/05 0436) BP: 171/86 (02/05 0436) Pulse Rate: 71 (02/05 0436)  Labs: Recent Labs    09/02/17 1431  09/03/17 0015  09/03/17 1820 09/04/17 0001 09/05/17 0423  HGB  --    < > 12.6  --   --  12.4 12.2  HCT  --   --  38.0  --   --  37.3 37.6  PLT  --   --  230  --   --  263 254  APTT 122*  --   --   --   --   --   --   HEPARINUNFRC 1.12*  --  0.76*   < > 0.41 0.25* 0.35   < > = values in this interval not displayed.    Estimated Creatinine Clearance: 59.7 mL/min (by C-G formula based on SCr of 0.8 mg/dL).   Medications:  Infusions:  . sodium chloride    . heparin 750 Units/hr (09/04/17 2049)    Assessment: 72 yo F presents on 1/31 with R pleural effusion and lung lesion. Was on Xarelto PTA for R arm DVT, but only on the second week of treatment dosing. Xarelto was held for thoracentesis on 2/1; last Xarelto dose was 1/31 at 0800.  Pharmacy is consulted to dose heparin while Xarelto on hold.  2/4  LP performed today, heparin held at 0829 this AM in preparation for procedure  Per MD, resume IV heparin infusion at 2000 tonight without bolus  CBC: Hgb and Pltc remain stable/WNL  No bleeding reported Today, 2/5  0423 HL=0.35 at goal, no infusion or bleeding issues per RN.   Goal of Therapy:  Heparin level 0.3-0.7 units/mL Monitor platelets by anticoagulation protocol: Yes   Plan:  Continue heparin drip at 750 units/hr Recheck HL in 6 hours Daily heparin level and CBC while on heparin infusion Continue to monitor for s/sx of bleeding   Dorrene German 09/04/2017 4:53 PM

## 2017-09-05 NOTE — Progress Notes (Signed)
ANTICOAGULATION CONSULT NOTE - Follow Up Consult  Pharmacy Consult for Heparin Indication: DVT  No Known Allergies  Patient Measurements: Height: 5\' 1"  (154.9 cm) Weight: 156 lb 1.4 oz (70.8 kg) IBW/kg (Calculated) : 47.8 Heparin Dosing Weight: 63.1 kg  Vital Signs: Temp: 97.5 F (36.4 C) (02/05 0436) Temp Source: Oral (02/05 0436) BP: 171/86 (02/05 0436) Pulse Rate: 71 (02/05 0436)  Labs: Recent Labs    09/02/17 1431  09/03/17 0015  09/04/17 0001 09/05/17 0423 09/05/17 1044  HGB  --    < > 12.6  --  12.4 12.2  --   HCT  --   --  38.0  --  37.3 37.6  --   PLT  --   --  230  --  263 254  --   APTT 122*  --   --   --   --   --   --   HEPARINUNFRC 1.12*  --  0.76*   < > 0.25* 0.35 0.39   < > = values in this interval not displayed.    Estimated Creatinine Clearance: 59.7 mL/min (by C-G formula based on SCr of 0.8 mg/dL).   Medications:  Infusions:  . sodium chloride    . heparin 750 Units/hr (09/04/17 2049)    Assessment: 72 yo F presents on 1/31 with R pleural effusion and lung lesion. Was on Xarelto PTA for R arm DVT, but only on the second week of treatment dosing. Xarelto was held for thoracentesis on 2/1; last Xarelto dose was 1/31 at 0800.  Pharmacy is consulted to dose heparin while Xarelto on hold.  2/4  LP performed today, heparin held at 0829 this AM in preparation for procedure  Per MD, resume IV heparin infusion at 2000 tonight without bolus  CBC: Hgb and Pltc remain stable/WNL  No bleeding reported  Today, 2/5  0423 HL=0.35 at goal, no infusion or bleeding issues per RN.   1044 HL= 0.39 at goal, no infusion or bleeding issues per RN.  Goal of Therapy:  Heparin level 0.3-0.7 units/mL Monitor platelets by anticoagulation protocol: Yes   Plan:  Continue heparin drip at 750 units/hr Daily heparin level and CBC while on heparin infusion Continue to monitor for s/sx of bleeding   Dolly Rias RPh 09/05/2017, 11:40 AM Pager [phone]

## 2017-09-06 DIAGNOSIS — C7951 Secondary malignant neoplasm of bone: Secondary | ICD-10-CM

## 2017-09-06 DIAGNOSIS — R599 Enlarged lymph nodes, unspecified: Secondary | ICD-10-CM

## 2017-09-06 DIAGNOSIS — G939 Disorder of brain, unspecified: Secondary | ICD-10-CM

## 2017-09-06 DIAGNOSIS — C3490 Malignant neoplasm of unspecified part of unspecified bronchus or lung: Secondary | ICD-10-CM

## 2017-09-06 LAB — BASIC METABOLIC PANEL
ANION GAP: 7 (ref 5–15)
BUN: 25 mg/dL — ABNORMAL HIGH (ref 6–20)
CO2: 24 mmol/L (ref 22–32)
Calcium: 9.3 mg/dL (ref 8.9–10.3)
Chloride: 104 mmol/L (ref 101–111)
Creatinine, Ser: 0.68 mg/dL (ref 0.44–1.00)
GFR calc Af Amer: >60 mL/min (ref >60)
Glucose, Bld: 133 mg/dL — ABNORMAL HIGH (ref 65–99)
POTASSIUM: 4.3 mmol/L (ref 3.5–5.1)
SODIUM: 135 mmol/L (ref 135–145)

## 2017-09-06 LAB — CBC WITH DIFFERENTIAL/PLATELET
BASOS ABS: 0 10*3/uL (ref 0.0–0.1)
BASOS PCT: 0 %
Eosinophils Absolute: 0 10*3/uL (ref 0.0–0.7)
Eosinophils Relative: 0 %
HEMATOCRIT: 37.9 % (ref 36.0–46.0)
Hemoglobin: 12.4 g/dL (ref 12.0–15.0)
Lymphocytes Relative: 8 %
Lymphs Abs: 1 10*3/uL (ref 0.7–4.0)
MCH: 28.8 pg (ref 26.0–34.0)
MCHC: 32.7 g/dL (ref 30.0–36.0)
MCV: 87.9 fL (ref 78.0–100.0)
Monocytes Absolute: 0.4 10*3/uL (ref 0.1–1.0)
Monocytes Relative: 3 %
NEUTROS ABS: 11 10*3/uL — AB (ref 1.7–7.7)
Neutrophils Relative %: 89 %
Platelets: 276 10*3/uL (ref 150–400)
RBC: 4.31 MIL/uL (ref 3.87–5.11)
RDW: 13.6 % (ref 11.5–15.5)
WBC: 12.4 10*3/uL — AB (ref 4.0–10.5)

## 2017-09-06 LAB — HEPARIN LEVEL (UNFRACTIONATED): Heparin Unfractionated: 0.37 IU/mL (ref 0.30–0.70)

## 2017-09-06 LAB — CULTURE, BODY FLUID-BOTTLE: CULTURE: NO GROWTH

## 2017-09-06 LAB — CULTURE, BODY FLUID W GRAM STAIN -BOTTLE

## 2017-09-06 NOTE — Progress Notes (Signed)
PCCM was consulted last week for evaluation of lung mass.  Pleural fluid results noted, showing adenocarcinoma. Pt has been seen by oncology with plans to start radiation today.  No need for bronchoscopy at this point.  We will be available as needed. Please call with questions.  Marshell Garfinkel MD [facility] Pager [phone] If no answer or after 3pm call: [phone] 09/06/2017, 1:07 PM

## 2017-09-06 NOTE — Progress Notes (Signed)
   09/06/17 1100  Clinical Encounter Type  Visited With Patient and family together  Visit Type Initial;Psychological support;Spiritual support  Referral From Nurse  Consult/Referral To Chaplain  Spiritual Encounters  Spiritual Needs Brochure;Emotional;Other (Comment) (Spiritual Care Conversation/Advance Directive)  Stress Factors  Patient Stress Factors Other (Comment) (Advance Directive )  Family Stress Factors Other (Comment) (Advance Directive )  Advance Directives (For Healthcare)  Does Patient Have a Medical Advance Directive? No  Would patient like information on creating a medical advance directive? Yes (Inpatient - patient requests chaplain consult to create a medical advance directive) (Information Given )   I provided the patient with a copy of the Advance Directive form. Please, contact Spiritual Care when patient si ready to complete.   Chaplain Shanon Ace M.Div., [facility]

## 2017-09-06 NOTE — Progress Notes (Signed)
IP PROGRESS NOTE  Subjective:  No new events overnight.  Patient has no new complaints.  No recurrence of headaches.    Objective: Vital signs in last 24 hours: Blood pressure 136/86, pulse 67, temperature 98 F (36.7 C), temperature source Oral, resp. rate 12, height '5\' 1"'$  (1.549 m), weight 156 lb 1.4 oz (70.8 kg), SpO2 100 %.  Intake/Output from previous day: No intake/output data recorded.  Physical Exam:  Alert, awake, oriented x3.  No acute distress other than pain in the right shoulder HEENT: Anicteric sclera, moist mucous membranes.  PERRLA, EOMI. Lungs: Clear to auscultation bilaterally, no expiratory wheezing Cardiac: S1/S2, regular.  No murmurs/rubs/gallops Abdomen: Soft, nontender, nondistended.  Bowel sounds are normoactive Extremities: No lower extremity edema Neuro: No gross focal neurological deficits   Lab Results: Recent Labs    09/05/17 0423 09/06/17 0808  WBC 9.1 12.4*  HGB 12.2 12.4  HCT 37.6 37.9  PLT 254 276    BMET Recent Labs    09/06/17 0410  NA 135  K 4.3  CL 104  CO2 24  GLUCOSE 133*  BUN 25*  CREATININE 0.68  CALCIUM 9.3    No results found for: CEA1  Studies/Results: Mr Thoracic Spine W Wo Contrast  Result Date: 09/04/2017 CLINICAL DATA:  Widely metastatic malignancy, newly diagnosed. EXAM: MRI THORACIC AND LUMBAR SPINE WITHOUT AND WITH CONTRAST TECHNIQUE: Multiplanar and multiecho pulse sequences of the thoracic and lumbar spine were obtained without and with intravenous contrast. CONTRAST:  81m MULTIHANCE GADOBENATE DIMEGLUMINE 529 MG/ML IV SOLN COMPARISON:  CT chest, abdomen, and pelvis 08/31/2017 FINDINGS: MRI THORACIC SPINE FINDINGS Alignment:  Normal. Vertebrae: Multiple osseous metastases as seen on recent CT, the largest measuring approximately 1.7 cm in the left half of the T6 vertebral body. A 1 cm lesion is present in the T4 vertebral body. Scattered smaller vertebral body and posterior element lesions are noted. No  epidural or other extraosseous tumor is identified. Vertebral body heights are preserved without a fracture identified. A posteromedial left tenth rib lesion is also again noted. Cord: Normal signal and morphology. No abnormal intradural enhancement. Paraspinal and other soft tissues: Multinodular thyroid goiter. Right pleural effusion, suspected right lower lobe lung mass, and thoracic lymphadenopathy more fully evaluated on recent chest CT. Disc levels: Mild thoracic spondylosis with only minimal disc bulging in the midthoracic spine. No sizable focal disc herniation, spinal stenosis, or neural foraminal stenosis. MRI LUMBAR SPINE FINDINGS Segmentation:  Standard. Alignment:  Normal. Vertebrae: L4 and L5 vertebral body enhancing metastases measuring approximately 2 cm each. Smaller enhancing lesions in the L1, L2, L4, and S1 vertebral bodies. Bilateral L4-5 facet enhancement is favored to be degenerative. A 5 mm sclerotic lesion is partially visualized in the posterior left ilium. Vertebral body heights are preserved without a fracture identified. No epidural or other extraosseous tumor is identified. Conus medullaris: Extends to the L1-2 level and appears normal. Normal appearance of the cauda equina without nerve root thickening or enhancement. Paraspinal and other soft tissues: Unremarkable. Disc levels: L1-2 through L3-4: Normal discs. No stenosis. Mild facet arthrosis at L3-4. L4-5: Disc desiccation. Mild disc bulging and mild facet and ligamentum flavum hypertrophy result in mild bilateral lateral recess stenosis and borderline bilateral neural foraminal stenosis. Small left foraminal annular fissure. L5-S1: Disc desiccation. Mild disc bulging with right subarticular annular fissure. Disc approaches the S1 nerve roots in the lateral recesses but does not result in significant stenosis. IMPRESSION: 1. Multiple osseous metastases throughout the thoracic and lumbar spine  and upper sacrum. No evidence of  epidural or leptomeningeal spinal disease. 2. Mild lower lumbar disc and facet degeneration. Mild lateral recess stenosis at L4-5. Electronically Signed   By: Logan Bores M.D.   On: 09/04/2017 14:55   Mr Lumbar Spine W Wo Contrast  Result Date: 09/04/2017 CLINICAL DATA:  Widely metastatic malignancy, newly diagnosed. EXAM: MRI THORACIC AND LUMBAR SPINE WITHOUT AND WITH CONTRAST TECHNIQUE: Multiplanar and multiecho pulse sequences of the thoracic and lumbar spine were obtained without and with intravenous contrast. CONTRAST:  54m MULTIHANCE GADOBENATE DIMEGLUMINE 529 MG/ML IV SOLN COMPARISON:  CT chest, abdomen, and pelvis 08/31/2017 FINDINGS: MRI THORACIC SPINE FINDINGS Alignment:  Normal. Vertebrae: Multiple osseous metastases as seen on recent CT, the largest measuring approximately 1.7 cm in the left half of the T6 vertebral body. A 1 cm lesion is present in the T4 vertebral body. Scattered smaller vertebral body and posterior element lesions are noted. No epidural or other extraosseous tumor is identified. Vertebral body heights are preserved without a fracture identified. A posteromedial left tenth rib lesion is also again noted. Cord: Normal signal and morphology. No abnormal intradural enhancement. Paraspinal and other soft tissues: Multinodular thyroid goiter. Right pleural effusion, suspected right lower lobe lung mass, and thoracic lymphadenopathy more fully evaluated on recent chest CT. Disc levels: Mild thoracic spondylosis with only minimal disc bulging in the midthoracic spine. No sizable focal disc herniation, spinal stenosis, or neural foraminal stenosis. MRI LUMBAR SPINE FINDINGS Segmentation:  Standard. Alignment:  Normal. Vertebrae: L4 and L5 vertebral body enhancing metastases measuring approximately 2 cm each. Smaller enhancing lesions in the L1, L2, L4, and S1 vertebral bodies. Bilateral L4-5 facet enhancement is favored to be degenerative. A 5 mm sclerotic lesion is partially visualized  in the posterior left ilium. Vertebral body heights are preserved without a fracture identified. No epidural or other extraosseous tumor is identified. Conus medullaris: Extends to the L1-2 level and appears normal. Normal appearance of the cauda equina without nerve root thickening or enhancement. Paraspinal and other soft tissues: Unremarkable. Disc levels: L1-2 through L3-4: Normal discs. No stenosis. Mild facet arthrosis at L3-4. L4-5: Disc desiccation. Mild disc bulging and mild facet and ligamentum flavum hypertrophy result in mild bilateral lateral recess stenosis and borderline bilateral neural foraminal stenosis. Small left foraminal annular fissure. L5-S1: Disc desiccation. Mild disc bulging with right subarticular annular fissure. Disc approaches the S1 nerve roots in the lateral recesses but does not result in significant stenosis. IMPRESSION: 1. 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 No evidence of epidural or leptomeningeal spinal disease. 2. Mild lower lumbar disc and facet degeneration. Mild lateral recess stenosis at L4-5. Electronically Signed   By: ALogan BoresM.D.   On: 09/04/2017 14:55   Mr Humerus Right W Wo Contrast  Result Date: 09/04/2017 CLINICAL DATA:  Lytic lesion of the humerus. EXAM: MRI OF THE RIGHT HUMERUS WITHOUT AND WITH CONTRAST TECHNIQUE: Multiplanar, multisequence MR imaging of the right humerus was performed before and after the administration of intravenous contrast. CONTRAST:  15 cc MultiHance intravenous contrast. COMPARISON:  CT chest dated F03/03/19 Right shoulder x-rays dated July 29, 2017. FINDINGS: Bones/Joint/Cartilage There is a large, destructive, enhancing marrow replacing lesion in the proximal humeral metadiaphysis with associated periosteal reaction. The craniocaudal length of involved bone measures approximately 10 cm and extends to the mid humeral shaft. No pathologic fracture. No additional marrow  replacing lesion seen. Small glenohumeral joint effusion. Muscles and Tendons Faint  muscle edema in the proximal subscapularis and infraspinatus muscles. Small interstitial tear at the infraspinatus myotendinous junction. Soft tissues Mild soft tissue inflammatory changes surrounding the osseous metastasis. Partially visualized small right pleural effusion and mass-like consolidation in the right lower lobe. IMPRESSION: 1. Large, destructive, enhancing marrow replacing lesion in the proximal humeral metadiaphysis, consistent with osseous metastasis. No pathologic fracture. 2. Partially visualized small right pleural effusion and mass-like consolidation in the right lower lobe, unchanged. Electronically Signed   By: Titus Dubin M.D.   On: 09/04/2017 14:27   Dg Fluoro Guide Lumbar Puncture  Result Date: 09/04/2017 CLINICAL DATA:  Leptomeningeal disease, metastasis EXAM: DIAGNOSTIC LUMBAR PUNCTURE UNDER FLUOROSCOPIC GUIDANCE FLUOROSCOPY TIME:  Fluoroscopy Time:  0.22 min Radiation Exposure Index (if provided by the fluoroscopic device): 7.84 mGy Number of Acquired Spot Images: 0 PROCEDURE: Informed consent was obtained from the patient prior to the procedure, including potential complications of headache, allergy, and pain. With the patient prone, the lower back was prepped with Betadine. 1% Lidocaine was used for local anesthesia. Lumbar puncture was performed at the L3-4 level using a 20 gauge needle with return of clear CSF with an opening pressure of 30 cm water. 10 ml of CSF were obtained for laboratory studies. The patient tolerated the procedure well and there were no apparent complications. IMPRESSION: Successful lumbar puncture under fluoroscopic guidance as described above. Electronically Signed   By: Rolm Baptise M.D.   On: 09/04/2017 15:29    Medications: I have reviewed the patient's current medications.  Assessment/Plan: 72 year old female with new discovery of what appears to be a widely  metastatic malignancy including right lower lobe mass measuring at least 6 cm with associated mediastinal lymphadenopathy, multifocal skeletal lesions including destructive lesion in the right humerus and several vertebral lesions without pathological fracture in addition to a new finding of leptomeningeal enhancement on MRI of the brain suggestive of metastatic disease there.  No focal CNS disease at this time.  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  Additional imaging revealed no leptomeningeal or spinal cord disease throughout the cervical, thoracic, or lumbar spine.  Pleural fluid is positive for presence of malignant cells consistent with adenocarcinoma.  Unfortunately, spinal fluid is also positive for presence of adenocarcinoma cells.  Recommendations: -Considering that the likely primary is non-small cell lung cancer with adenocarcinoma morphology, I would like to obtain a larger biopsy sample to submitted for molecular testing with foundation 1 to determine if treatable mutations are present to allow for targeted therapy utilization.  In particular, I would be very interested in see if the tumor has mutations in EGFR gene to allow for use of osimertinib that he is known to have reasonable blood brain barrier penetration as patient does have significant CNS involvement with positive CSF cytology.  Exact target for biopsy is unclear at this point.  The likely easiest would be to use the right humeral lesion, by direct lung biopsy is also a possibility. -Continue dexamethasone for now.  Dose adjustments will be deferred to radiation oncology, Dr. Tammi Klippel. - Initiation of radiation therapy at the discretion of radiation oncology   LOS: 6 days   Ardath Sax, MD   09/06/2017, 9:17 AM

## 2017-09-06 NOTE — Progress Notes (Signed)
ANTICOAGULATION CONSULT NOTE - Follow Up Consult  Pharmacy Consult for Heparin Indication: DVT  No Known Allergies  Patient Measurements: Height: 5\' 1"  (154.9 cm) Weight: 156 lb 1.4 oz (70.8 kg) IBW/kg (Calculated) : 47.8 Heparin Dosing Weight: 63.1 kg  Vital Signs: Temp: 98 F (36.7 C) (02/06 0512) Temp Source: Oral (02/06 0512) BP: 120/77 (02/06 0512) Pulse Rate: 69 (02/06 0512)  Labs: Recent Labs    09/04/17 0001 09/05/17 0423 09/05/17 1044 09/06/17 0410  HGB 12.4 12.2  --   --   HCT 37.3 37.6  --   --   PLT 263 254  --   --   HEPARINUNFRC 0.25* 0.35 0.39 0.37  CREATININE  --   --   --  0.68    Estimated Creatinine Clearance: 59.7 mL/min (by C-G formula based on SCr of 0.68 mg/dL).   Medications:  Infusions:  . sodium chloride    . heparin 750 Units/hr (09/04/17 2049)    Assessment: 72 yo F presents on 1/31 with R pleural effusion and lung lesion. Was on Xarelto PTA for R arm DVT, but only on the second week of treatment dosing. Xarelto was held for thoracentesis on 2/1; last Xarelto dose was 1/31 at 0800.  Pharmacy is consulted to dose heparin while Xarelto on hold.  09/06/2017 HL 0.37 ,therapeutic CBC WNL yesterday, still in process today No s/s of bleeding  Goal of Therapy:  Heparin level 0.3-0.7 units/mL Monitor platelets by anticoagulation protocol: Yes   Plan:  Continue heparin drip at 750 units/hr Daily heparin level and CBC while on heparin infusion Continue to monitor for s/sx of bleeding F/u when to transition back to [place] 09/06/2017, 7:48 AM Pager [phone]

## 2017-09-06 NOTE — Progress Notes (Signed)
PROGRESS NOTE    Samantha [name]  XIP:[id number] DOB: [birth year] DOA: 08/31/2017 PCP: Patient, No Pcp Per   Brief Narrative: Patient is 72 year old female admitted on 08/31/2017 .She was noted to have pain in her right shoulder for 2 weeks and was seen in the emergency department on 07/29/17 and was found to have a lytic lesion in her right humerus as well as lower extremity DVT .she was referred for workup and was seen again in the emergency department on 08/31/17 with increased shoulder pain, shortness of breath and nausea with vomiting.  Patient was found to have what appears to be a widely metastatic malignancy including right lower lobe lung  mass measuring at least 6 cm with associated mediastinal lymphadenopathy, multifocal skeletal lesions including destructive lesion in the right humerus and several vertebral lesions without pathological fracture and  in addition to a new finding of leptomeningeal enhancement on MRI of the brain suggestive of metastatic disease there.  She also had large right pleural effusion. No focal CNS disease at this time.  Transferred [facility] to [facility] on 09/02/2017 for possible radiation therapy.She has been started on Xarelto for DVT . Cytology from thoracentesis of 09/05/2017 and CSF both showed malignant cells consistent with metastatic adenocarcinoma from lung. 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 Patient has a right lung mass and right pleural effusion . MRI of the thoracic and lumbar spine with and without contrast pending does not indicate additional foci of  leptomeningeal disease, however it does show foci of bony metastasis. Started on  dexamethasone .Radiation oncology following.  Patient underwent CT radiation stimulation on 09/05/2017 prior to radiation therapy for leptomeningeal disease.Plan is to start on chemotherapy following radiotherapy  MRI result previously reviewed with Dr Annette Stable, neurosurgery, does not appears that patient has any urgent neurosurgical needs, if patient fail to improve with radiation therapy then she might need further neurosurgery evaluation.  Right humerus with multiple lytic lesions: Continue current regimen.  Anticipate improvement of her pain about 1.5 -2 weeks into the course of her treatment.  Right upper extremity QBH:ALPFXTK on hold, c/n  IV heparin for now, Resume Xarelto once surgical procedures are completed.We will resume Xarelto in next few days  Headaches with nausea and vomiting:Secondary to leptomeningeal disease in the setting of metastatic lung cancer, improved with Decadron  Social/Ethics:given intracranial findings/mets and need for radiation therapy, patient and patient's son concerned about cognitive deficits, patient would like her son to Become her HCPOA, pastoral  services and case management consult requested to help facilitate advanced directive and power of attorney documentation/paperwork. D/w Pt's son Mr. Noha Milberger - [phone].       DVT prophylaxis: IV heparin Code Status: Full Family Communication: Family present at the bedside Disposition Plan: Unknownat this time   Consultants: Radiation oncology, oncology  Procedures: Radiation therapy  Antimicrobials: None  Subjective: Patient seen and examined at the bedside.  No new issues.  Family present at the bedside.  Objective: Vitals:   09/05/17 2127 09/06/17 0512 09/06/17 0745 09/06/17 1430  BP: 138/80 120/77 136/86 (!) 152/77  Pulse: 76 69 67 63  Resp: 17  16 12 16   Temp: 98.5 F (36.9 C) 98 F (36.7 C)  97.8 F  (36.6 C)  TempSrc: Oral Oral  Oral  SpO2: 98% 100% 100% 100%  Weight:      Height:        Intake/Output Summary (Last 24 hours) at 09/06/2017 1619 Last data filed at 09/06/2017 1240 Gross per 24 hour  Intake -  Output 100 ml  Net -100 ml   Filed Weights   08/31/17 1929 09/02/17 2129  Weight: 78.5 kg (173 lb) 70.8 kg (156 lb 1.4 oz)    Examination:  General exam: Appears calm and comfortable ,Not in distress,average built Respiratory system: Bilateral equal air entry, normal vesicular breath sounds, no wheezes or crackles  Cardiovascular system: S1 & S2 heard, RRR. No JVD, murmurs, rubs, gallops or clicks. No pedal edema. Gastrointestinal system: Abdomen is nondistended, soft and nontender. No organomegaly or masses felt. Normal bowel sounds heard. Central nervous system: Alert and oriented. No focal neurological deficits. Extremities: No edema, no clubbing ,no cyanosis, distal peripheral pulses palpable. Skin: No rashes, lesions or ulcers,no icterus ,no pallor Psychiatry: Judgement and insight appear normal. Mood & affect appropriate.     Data Reviewed: I have personally reviewed following labs and imaging studies  CBC: Recent Labs  Lab 09/02/17 0432 09/03/17 0015 09/04/17 0001 09/05/17 0423 09/06/17 0808  WBC 5.6 5.4 12.5* 9.1 12.4*  NEUTROABS  --   --   --   --  11.0*  HGB 12.2 12.6 12.4 12.2 12.4  HCT 37.9 38.0 37.3 37.6 37.9  MCV 89.6 87.4 87.6 88.7 87.9  PLT 207 230 263 254 607   Basic Metabolic Panel: Recent Labs  Lab 08/31/17 1446 09/02/17 0432 09/06/17 0410  NA 136 136 135  K 4.1 4.6 4.3  CL 103 103 104  CO2 20* 21* 24  GLUCOSE 115* 111* 133*  BUN 12 14 25*  CREATININE 0.79 0.80 0.68  CALCIUM 9.6 9.2 9.3   GFR: Estimated Creatinine Clearance: 59.7 mL/min (by C-G formula based on SCr of 0.68 mg/dL). Liver Function Tests: Recent Labs  Lab 08/31/17 1446  AST 20  ALT 14  ALKPHOS 107  BILITOT 0.6  PROT 7.3  ALBUMIN 3.9   Recent Labs  Lab  08/31/17 1446 Sep 26, 2017 0946  LIPASE 26  --   AMYLASE  --  351*   No results for input(s): AMMONIA in the last 168 hours. Coagulation Profile: No results for input(s): INR, PROTIME in the last 168 hours. Cardiac Enzymes: No results for input(s): CKTOTAL, CKMB, CKMBINDEX, TROPONINI in the last 168 hours. BNP (last 3 results) No results for input(s): PROBNP in the last 8760 hours. HbA1C: No results for input(s): HGBA1C in the last 72 hours. CBG: No results for input(s): GLUCAP in the last 168 hours. Lipid Profile: No results for input(s): CHOL, HDL, LDLCALC, TRIG, CHOLHDL, LDLDIRECT in the last 72 hours. Thyroid Function Tests: No results for input(s): TSH, T4TOTAL, FREET4, T3FREE, THYROIDAB in the last 72 hours. Anemia Panel: No results for input(s): VITAMINB12, FOLATE, FERRITIN, TIBC, IRON, RETICCTPCT in the last 72 hours. Sepsis Labs: Recent Labs  Lab 08/31/17 1900  LATICACIDVEN 0.76    Recent Results (from the past 240 hour(s))  Acid Fast Smear (AFB)     Status: None   Collection Time: September 26, 2017  9:21 AM  Result Value Ref Range Status   AFB Specimen Processing Concentration  Final   Acid Fast Smear Negative  Final    Comment: (NOTE) Performed At:  [facility] [place], [place] [id number] Rush Farmer MD YQ:[id number]    Source (AFB) PLEURAL  Final    Comment: RIGHT Performed at [facility], [place] [address], [place], [place] [address]   Culture, body fluid-bottle     Status: None (Preliminary result)   Collection Time: 2017-09-10  9:21 AM  Result Value Ref Range Status   Specimen Description PLEURAL RIGHT  Final   Special Requests NONE  Final   Culture   Final    NO GROWTH 4 DAYS Performed at [facility] [address], [place], [place] [address]    Report Status PENDING  Incomplete  Gram stain     Status: None   Collection Time: 2017/09/10  9:21 AM  Result Value Ref Range Status   Specimen Description PLEURAL RIGHT  Final    Special Requests NONE  Final   Gram Stain   Final    RARE WBC PRESENT, PREDOMINANTLY MONONUCLEAR NO ORGANISMS SEEN Performed at [facility], [address], [place], [place] [address]    Report Status 2017-09-10 FINAL  Final         Radiology Studies: No results found.      Scheduled Meds: . bisacodyl  10 mg Rectal Once  . dexamethasone  8 mg Intravenous Q8H  . feeding supplement (ENSURE ENLIVE)  237 mL Oral BID BM  . polyethylene glycol  17 g Oral BID  . senna  1 tablet Oral Daily  . sodium chloride flush  3 mL Intravenous Q12H   Continuous Infusions: . sodium chloride    . heparin 750 Units/hr (09/04/17 2049)     LOS: 6 days    Time spent: 25 mins    Nels Munn Jodie Echevaria, MD [facility] Pager [phone]  If 7PM-7AM, please contact night-coverage [email or web address] Password [email or web address] 09/06/2017, 4:19 PM

## 2017-09-06 NOTE — Progress Notes (Signed)
PT Cancellation Note  Patient Details Name: Samantha [name] MRN: [id number] DOB: [birth year]   Cancelled Treatment:     pt out of room.  Will check back another day as schedule permits.   Nathanial Rancher 09/06/2017, 2:14 PM

## 2017-09-07 ENCOUNTER — Inpatient Hospital Stay (HOSPITAL_COMMUNITY): Payer: Medicare HMO

## 2017-09-07 ENCOUNTER — Ambulatory Visit
Admit: 2017-09-07 | Discharge: 2017-09-07 | Disposition: A | Discharge status: Home / Self Care | Payer: Medicare HMO | Attending: Radiation Oncology | Admitting: Radiation Oncology

## 2017-09-07 DIAGNOSIS — C799 Secondary malignant neoplasm of unspecified site: Secondary | ICD-10-CM

## 2017-09-07 LAB — CBC
HEMATOCRIT: 37.4 % (ref 36.0–46.0)
HEMOGLOBIN: 12.3 g/dL (ref 12.0–15.0)
MCH: 29.3 pg (ref 26.0–34.0)
MCHC: 32.9 g/dL (ref 30.0–36.0)
MCV: 89 fL (ref 78.0–100.0)
PLATELETS: 262 10*3/uL (ref 150–400)
RBC: 4.2 MIL/uL (ref 3.87–5.11)
RDW: 13.7 % (ref 11.5–15.5)
WBC: 13.7 10*3/uL — AB (ref 4.0–10.5)

## 2017-09-07 LAB — HEPARIN LEVEL (UNFRACTIONATED): Heparin Unfractionated: 0.41 IU/mL (ref 0.30–0.70)

## 2017-09-07 MED ORDER — OXYCODONE HCL 5 MG PO TABS
10.0000 mg | ORAL_TABLET | Freq: Four times a day (QID) | ORAL | Status: DC | PRN
  Administered 2017-09-07 – 2017-09-08 (×2): 10 mg via ORAL
  Filled 2017-09-07 (×2): qty 2

## 2017-09-07 NOTE — Progress Notes (Signed)
Initial Nutrition Assessment  DOCUMENTATION CODES:   Obesity unspecified  INTERVENTION:   Continue Ensure Enlive po BID, each supplement provides 350 kcal and 20 grams of protein  NUTRITION DIAGNOSIS:   Increased nutrient needs related to catabolic illness as evidenced by estimated needs.  GOAL:   Patient will meet greater than or equal to 90% of their needs  MONITOR:   PO intake, Supplement acceptance, Labs, Skin, Weight trends  REASON FOR ASSESSMENT:   Malnutrition Screening Tool    ASSESSMENT:   72 y.o. Female with newly diagnosed right arm lytic lesion and DVT on Xarelto presenting today with 2 days history of increased right arm pain, nausea and vomiting.    Pt in room with granddaughter at bedside. Pt transferred from [facility] to [facility] on 2/2. Pt reports good appetite but states she only consuming applesauce and corn today. States she likes Ensure supplements, says she drank 1 today.  Per chart, pt ate 100% of breakfast yesterday. Denies issues with chewing or swallowing. Pt's granddaughter reports pt hasn't been eating as well PTA.   Per weight records, pt's weight is stable. Pt reports UBW is within the 160 lb range.   Labs reviewed. Medications: IV Decadron every 8 hours, Miralax packet BID, Senokot tablet daily  NUTRITION - FOCUSED PHYSICAL EXAM:  Nutrition focused physical exam shows no sign of depletion of muscle mass or body fat.  Diet Order:  Diet Heart Room service appropriate? Yes; Fluid consistency: Thin  EDUCATION NEEDS:   No education needs have been identified at this time  Skin:  Skin Assessment: Reviewed RN Assessment  Last BM:  1/29  Height:   Ht Readings from Last 1 Encounters:  09/02/17 5\' 1"  (1.549 m)    Weight:   Wt Readings from Last 1 Encounters:  09/07/17 169 lb 12.1 oz (77 kg)    Ideal Body Weight:  47.7 kg  BMI:  Body mass index is 32.07 kg/m.  Estimated Nutritional Needs:   Kcal:  2000-2200  Protein:  100-115  gm  Fluid:  2.0-2.2 L  Clayton Bibles, MS, RD, LDN [place] Dietitian Pager: [phone] After Hours Pager: [phone]

## 2017-09-07 NOTE — Progress Notes (Addendum)
PROGRESS NOTE    Samantha [name]  AUQ:[id number] DOB: [birth year] DOA: 08/31/2017 PCP: Patient, No Pcp Per   Brief Narrative: Patient is 72 year old female admitted on 08/31/2017 .She was noted to have pain in her right shoulder for 2 weeks and was seen in the emergency department on 07/29/17 and was found to have a lytic lesion in her right humerus as well as lower extremity DVT .she was referred for workup and was seen again in the emergency department on 08/31/17 with increased shoulder pain, shortness of breath and nausea with vomiting.  Patient was found to have what appears to be a widely metastatic malignancy including right lower lobe lung  mass measuring at least 6 cm with associated mediastinal lymphadenopathy, multifocal skeletal lesions including destructive lesion in the right humerus and several vertebral lesions without pathological fracture and  in addition to a new finding of leptomeningeal enhancement on MRI of the brain suggestive of metastatic disease there.  She also had large right pleural effusion. No focal CNS disease at this time.  Transferred from [facility] to [facility] on 09/02/2017 for possible radiation therapy.She has been started on Xarelto for DVT . Cytology from thoracentesis of 10/01/2017 and CSF both showed malignant cells consistent with metastatic adenocarcinoma from lung. Patient has been started on radiation therapy.  Plan is to start on chemotherapy soon.    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 Patient has a right lung mass and right pleural effusion . MRI of the  thoracic and lumbar spine with and without contrast pending does not indicate additional foci of leptomeningeal disease, however it does show foci of bony metastasis. Started on  dexamethasone .Radiation oncology following.  Patient underwent CT radiation stimulation on 09/05/2017 prior to radiation therapy for leptomeningeal disease.Plan is to start on chemotherapy following radiotherapy  MRI result previously reviewed with Dr Annette Stable, neurosurgery, does not appears that patient has any urgent neurosurgical needs, if patient fail to improve with radiation therapy then she might need further neurosurgery evaluation.  We will repeat chest x-ray today to follow-up for the pleural effusion.  We might do a thoracentesis to repeat the cytology for adequate tissue diagnosis. Discussed with Dr.Perlov today. We also consulted palliative care for pain management.   Right humerus with multiple lytic lesions: Continue current regimen. On radiation therapy. anticipate improvement of her pain about 1.5 -2 weeks into the course of her treatment.  Right upper extremity WLS:LHTDSKA on hold, c/n  IV heparin for now, Resume Xarelto once surgical procedures are completed.We will resume Xarelto in next few days  Headaches with nausea and vomiting:Secondary to leptomeningeal disease in the setting of metastatic lung cancer, improved with Decadron  Social/Ethics:given intracranial findings/mets and need for radiation therapy, patient and patient's son concerned about cognitive deficits, patient would like her son to Become her HCPOA, pastoral  services and case management consult requested to help facilitate advanced directive and power of attorney documentation/paperwork. D/w Pt's son Samantha [name] - [phone].     DVT prophylaxis: IV heparin Code Status: Full Family Communication: Family present at the bedside Disposition Plan: Unknown at this  time   Consultants: Radiation oncology, oncology  Procedures:  Radiation therapy  Antimicrobials: None  Subjective: Patient seen and examined the bedside this morning.  Underwent radiation therapy yesterday. We discussed extensively about the further plans with the stone and 1 of the relatives Ms. Samantha [name] this morning  Objective: Vitals:   09/06/17 0745 09/06/17 1430 09/06/17 2007 09/07/17 0459  BP: 136/86 (!) 152/77 125/60 (!) 154/92  Pulse: 67 63 75 63  Resp: 12 16 14 12   Temp:  97.8 F (36.6 C) 98.1 F (36.7 C) 98.1 F (36.7 C)  TempSrc:  Oral Oral Oral  SpO2: 100% 100% 96% 97%  Weight:    77 kg (169 lb 12.1 oz)  Height:        Intake/Output Summary (Last 24 hours) at 09/07/2017 1415 Last data filed at 09/07/2017 0954 Gross per 24 hour  Intake 240 ml  Output -  Net 240 ml   Filed Weights   08/31/17 1929 09/02/17 2129 09/07/17 0459  Weight: 78.5 kg (173 lb) 70.8 kg (156 lb 1.4 oz) 77 kg (169 lb 12.1 oz)    Examination:  General exam: Appears calm and comfortable ,Not in distress,average built Respiratory system: Bilateral equal air entry, normal vesicular breath sounds, no wheezes or crackles  Cardiovascular system: S1 & S2 heard, RRR. No JVD, murmurs, rubs, gallops or clicks.  Gastrointestinal system: Abdomen is nondistended, soft and nontender. No organomegaly or masses felt. Normal bowel sounds heard. Central nervous system: Alert and oriented. No focal neurological deficits. Extremities: No edema, no clubbing ,no cyanosis, distal peripheral pulses palpable. Tenderness on the right upper extremity near shoulder Skin: No cyanosis,No pallor,No Rash,No Ulcer Psychiatry: Judgement and insight appear normal. Mood & affect appropriate.  GU: No Foley    Data Reviewed: I have personally reviewed following labs and imaging studies  CBC: Recent Labs  Lab 09/03/17 0015 09/04/17 0001 09/05/17 0423 09/06/17 0808 09/07/17 0409  WBC 5.4 12.5* 9.1 12.4* 13.7*  NEUTROABS  --   --   --  11.0*  --   HGB 12.6 12.4 12.2 12.4 12.3    HCT 38.0 37.3 37.6 37.9 37.4  MCV 87.4 87.6 88.7 87.9 89.0  PLT 230 263 254 276 811   Basic Metabolic Panel: Recent Labs  Lab 08/31/17 1446 09/02/17 0432 09/06/17 0410  NA 136 136 135  K 4.1 4.6 4.3  CL 103 103 104  CO2 20* 21* 24  GLUCOSE 115* 111* 133*  BUN 12 14 25*  CREATININE 0.79 0.80 0.68  CALCIUM 9.6 9.2 9.3   GFR: Estimated Creatinine Clearance: 62.3 mL/min (by C-G formula based on SCr of 0.68 mg/dL). Liver Function Tests: Recent Labs  Lab 08/31/17 1446  AST 20  ALT 14  ALKPHOS 107  BILITOT 0.6  PROT 7.3  ALBUMIN 3.9   Recent Labs  Lab 08/31/17 1446 09-22-17 0946  LIPASE 26  --   AMYLASE  --  351*   No results for input(s): AMMONIA in the last 168 hours. Coagulation Profile: No results for input(s): INR, PROTIME in the last 168 hours. Cardiac Enzymes: No results for input(s): CKTOTAL, CKMB, CKMBINDEX, TROPONINI in the last 168 hours. BNP (last 3 results) No results for input(s): PROBNP in the last 8760 hours. HbA1C: No results for input(s): HGBA1C in the last 72 hours. CBG: No results for input(s): GLUCAP in the last 168 hours. Lipid Profile: No results for input(s): CHOL, HDL, LDLCALC, TRIG, CHOLHDL, LDLDIRECT in the last 72 hours. Thyroid Function Tests: No results  for input(s): TSH, T4TOTAL, FREET4, T3FREE, THYROIDAB in the last 72 hours. Anemia Panel: No results for input(s): VITAMINB12, FOLATE, FERRITIN, TIBC, IRON, RETICCTPCT in the last 72 hours. Sepsis Labs: Recent Labs  Lab 08/31/17 1900  LATICACIDVEN 0.76    Recent Results (from the past 240 hour(s))  Acid Fast Smear (AFB)     Status: None   Collection Time: 09/03/17  9:21 AM  Result Value Ref Range Status   AFB Specimen Processing Concentration  Final   Acid Fast Smear Negative  Final    Comment: (NOTE) Performed At: [facility] [place], [place] [id number] Rush Farmer MD [id number]    Source (AFB) PLEURAL  Final    Comment: RIGHT Performed  at [facility], [place] [address], [place], [place] [address]   Culture, body fluid-bottle     Status: None   Collection Time: 03-Sep-2017  9:21 AM  Result Value Ref Range Status   Specimen Description PLEURAL RIGHT  Final   Special Requests NONE  Final   Culture   Final    NO GROWTH 5 DAYS Performed at [facility] [address] [place], [place] [address]    Report Status 09/06/2017 FINAL  Final  Gram stain     Status: None   Collection Time: 2017/09/03  9:21 AM  Result Value Ref Range Status   Specimen Description PLEURAL RIGHT  Final   Special Requests NONE  Final   Gram Stain   Final    RARE WBC PRESENT, PREDOMINANTLY MONONUCLEAR NO ORGANISMS SEEN Performed at [facility], [place] [address], [place], [place] [address]    Report Status 09-03-2017 FINAL  Final         Radiology Studies: No results found.      Scheduled Meds: . bisacodyl  10 mg Rectal Once  . dexamethasone  8 mg Intravenous Q8H  . feeding supplement (ENSURE ENLIVE)  237 mL Oral BID BM  . polyethylene glycol  17 g Oral BID  . senna  1 tablet Oral Daily  . sodium chloride flush  3 mL Intravenous Q12H   Continuous Infusions: . sodium chloride    . heparin 750 Units/hr (09/04/17 2049)     LOS: 7 days    Time spent: 25 mins    Leasa Kincannon Jodie Echevaria, MD [facility] Pager [phone]  If 7PM-7AM, please contact night-coverage [email or web address] Password [email or web address] 09/07/2017, 2:15 PM

## 2017-09-07 NOTE — Progress Notes (Addendum)
Physical Therapy Treatment Patient Details Name: FRANCETTA ILG MRN: [id number] DOB: [birth year] Today's Date: 09/07/2017    History of Present Illness 72 year old female admitted on 08/31/2017 patient was found to have what appears to be a widely metastatic malignancy including right lower lobe mass measuring at least 6 cm with associated mediastinal lymphadenopathy, multifocal skeletal lesions including destructive lesion in the right humerus and several vertebral lesions without pathological fracture in addition to a new finding of leptomeningeal enhancement on MRI of the brain suggestive of metastatic disease there.     PT Comments    Patient received in bed with family present, pleasant and willing to work with skilled PT services. She does require MinA for bed mobility today, but is able to complete functional transfers with Mod(I), gait approximately 452f with Min guard. Note ongoing pattern of short step lengths/stride length with some drifting L/R and tendency to hold onto counter top or railing in hallway when it is available, did not appear to retain PT cues for gait mechanics as cues had to be given repeatedly today. Fatigued at end of session. She was left sitting at EOB with family present and reporting they will be in the room with her for most of the afternoon, all needs otherwise met this afternoon.     Follow Up Recommendations  Home Health PT      Equipment Recommendations  None recommended by PT    Recommendations for Other Services       Precautions / Restrictions Precautions Precautions: Fall Restrictions Weight Bearing Restrictions: No    Mobility  Bed Mobility Overal bed mobility: Needs Assistance Bed Mobility: Supine to Sit     Supine to sit: Min assist     General bed mobility comments: minA to rotate hips forward and around to EOB today   Transfers Overall transfer level: Modified independent               General transfer comment: extra  time  Ambulation/Gait Ambulation/Gait assistance: Min guard Ambulation Distance (Feet): 400 Feet Assistive device: None Gait Pattern/deviations: Step-through pattern;Decreased step length - right;Decreased step length - left;Decreased dorsiflexion - right;Decreased dorsiflexion - left;Drifts right/left     General Gait Details: slow pace with short step lengths and short overall stride length, able to ambulate with no device but tends to use railing/hold onto counter when available    Stairs            Wheelchair Mobility    Modified Rankin (Stroke Patients Only)       Balance Overall balance assessment: Needs assistance   Sitting balance-Leahy Scale: Good       Standing balance-Leahy Scale: Good                              Cognition Arousal/Alertness: Awake/alert Behavior During Therapy: WFL for tasks assessed/performed Overall Cognitive Status: Impaired/Different from baseline Area of Impairment: Attention;Memory;Safety/judgement;Problem solving                   Current Attention Level: Selective Memory: Decreased short-term memory   Safety/Judgement: Decreased awareness of safety;Decreased awareness of deficits   Problem Solving: Requires verbal cues;Slow processing General Comments: patient confused, requires cues for safety and navigation in hallway       Exercises      General Comments        Pertinent Vitals/Pain Pain Assessment: No/denies pain Pain Score: 0-No pain Faces Pain Scale: No hurt Pain  Intervention(s): Limited activity within patient's tolerance;Monitored during session    Home Living                      Prior Function            PT Goals (current goals can now be found in the care plan section) Acute Rehab PT Goals Patient Stated Goal: To return home PT Goal Formulation: With patient Time For Goal Achievement: 09/11/17 Potential to Achieve Goals: Good Progress towards PT goals: Progressing  toward goals    Frequency    Min 3X/week      PT Plan Current plan remains appropriate    Co-evaluation              AM-PAC PT "6 Clicks" Daily Activity  Outcome Measure  Difficulty turning over in bed (including adjusting bedclothes, sheets and blankets)?: None Difficulty moving from lying on back to sitting on the side of the bed? : None Difficulty sitting down on and standing up from a chair with arms (e.g., wheelchair, bedside commode, etc,.)?: A Little Help needed moving to and from a bed to chair (including a wheelchair)?: A Little Help needed walking in hospital room?: A Little Help needed climbing 3-5 steps with a railing? : A Little 6 Click Score: 20    End of Session Equipment Utilized During Treatment: Gait belt Activity Tolerance: Patient tolerated treatment well Patient left: in bed;with family/visitor present;with call bell/phone within reach(seated at EOB per patient request )   PT Visit Diagnosis: Other symptoms and signs involving the nervous system (R29.898);Muscle weakness (generalized) (M62.81)     Time: 6812-7517 PT Time Calculation (min) (ACUTE ONLY): 17 min  Charges:  $Gait Training: 8-22 mins                    G Codes:       Deniece Ree PT, DPT, CBIS  Supplemental Physical Therapist [place]   Pager [phone]

## 2017-09-07 NOTE — Progress Notes (Signed)
PT Cancellation Note  Patient Details Name: TAYLINN BRABANT MRN: [id number] DOB: 08-Aug-1945   Cancelled Treatment:    Reason Eval/Treat Not Completed: Other (comment) Patient continuing to finish eating breakfast, requests that PT return later in day. Plan to attempt to return as time/schedule allows.    Deniece Ree PT, DPT, CBIS  Supplemental Physical Therapist [facility]   Pager [phone]

## 2017-09-07 NOTE — Progress Notes (Signed)
Spoke to case manager regarding possible HHPT for patient; PT recommendations have been updated as appropriate.  Deniece Ree PT, DPT, CBIS  Supplemental Physical Therapist [facility]   Pager [phone]

## 2017-09-07 NOTE — Progress Notes (Signed)
ANTICOAGULATION CONSULT NOTE - Follow Up Consult  Pharmacy Consult for Heparin Indication: DVT  No Known Allergies  Patient Measurements: Height: 5\' 1"  (154.9 cm) Weight: 169 lb 12.1 oz (77 kg) IBW/kg (Calculated) : 47.8 Heparin Dosing Weight: 63.1 kg  Vital Signs: Temp: 98.1 F (36.7 C) (02/07 0459) Temp Source: Oral (02/07 0459) BP: 154/92 (02/07 0459) Pulse Rate: 63 (02/07 0459)  Labs: Recent Labs    09/05/17 0423 09/05/17 1044 09/06/17 0410 09/06/17 0808 09/07/17 0409  HGB 12.2  --   --  12.4 12.3  HCT 37.6  --   --  37.9 37.4  PLT 254  --   --  276 262  HEPARINUNFRC 0.35 0.39 0.37  --  0.41  CREATININE  --   --  0.68  --   --     Estimated Creatinine Clearance: 62.3 mL/min (by C-G formula based on SCr of 0.68 mg/dL).   Medications:  Infusions:  . sodium chloride    . heparin 750 Units/hr (09/04/17 2049)    Assessment: 72 yo F presents on 1/31 with R pleural effusion and lung lesion. Was on Xarelto PTA for R arm DVT, but only on the second week of treatment dosing. Xarelto was held for thoracentesis on 2/1; last Xarelto dose was 1/31 at 0800.  Pharmacy is consulted to dose heparin while Xarelto on hold.  09/07/2017 HL 0.41 ,therapeutic CBC WNL  No s/s of bleeding  Goal of Therapy:  Heparin level 0.3-0.7 units/mL Monitor platelets by anticoagulation protocol: Yes   Plan:  Continue heparin drip at 750 units/hr Daily heparin level and CBC while on heparin infusion Continue to monitor for s/sx of bleeding F/u when to transition back to [place] 09/07/2017, 7:26 AM Pager [phone]

## 2017-09-07 NOTE — Progress Notes (Signed)
IP PROGRESS NOTE  Subjective:  No new events overnight.  Patient has no new complaints.  No recurrence of headaches.    Objective: Vital signs in last 24 hours: Blood pressure (!) 154/92, pulse 63, temperature 98.1 F (36.7 C), temperature source Oral, resp. rate 12, height _0  (1.549 m), weight 169 lb 12.1 oz (77 kg), SpO2 97 %.  Intake/Output from previous day: 02/06 0701 - 02/07 0700 In: -  Out: 100 [Urine:100]  Physical Exam:  Alert, awake, oriented x3.  Has significant difficulty with short-term memory and minimal cognitive tasks.  Cannot recall what she has had for dinner last night.  Is unable to count by threes down from 100 mainly due to lack of attempt to comply with instruction altogether HEENT: Anicteric sclera, moist mucous membranes.  PERRLA, EOMI. Lungs: Clear to auscultation bilaterally, no expiratory wheezing Cardiac: S1/S2, regular.  No murmurs/rubs/gallops Abdomen: Soft, nontender, nondistended.  Bowel sounds are normoactive Extremities: No lower extremity edema Neuro: No gross focal neurological deficits.  Significant cognitive deficit as outlined above   Lab Results: Recent Labs    09/06/17 0808 09/07/17 0409  WBC 12.4* 13.7*  HGB 12.4 12.3  HCT 37.9 37.4  PLT 276 262    BMET Recent Labs    09/06/17 0410  NA 135  K 4.3  CL 104  CO2 24  GLUCOSE 133*  BUN 25*  CREATININE 0.68  CALCIUM 9.3    No results found for: CEA1  Studies/Results: No results found.  Medications: I have reviewed the patient's current medications.  Assessment/Plan: 72 year old female with new discovery of what appears to be a widely metastatic malignancy including right lower lobe mass measuring at least 6 cm with associated mediastinal lymphadenopathy, multifocal skeletal lesions including destructive lesion in the right humerus and several vertebral lesions without pathological fracture in addition to a new finding of leptomeningeal enhancement on MRI of the brain  suggestive of metastatic disease there.  No focal CNS disease at this time.  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  Additional imaging revealed no leptomeningeal or spinal cord disease throughout the cervical, thoracic, or lumbar spine.  Pleural fluid is positive for presence of malignant cells consistent with adenocarcinoma.  Unfortunately, spinal fluid is also positive for presence of adenocarcinoma cells.  Patient has started radiation therapy targeting whole brain, right humerus, and thoracic spine lesions.  Recommendations: -Recommend chest x-ray to assess for reaccumulation of pleural effusion.  If effusion is reaccumulating, please obtain repeat thoracentesis with as much fluid removed as possible with cytology and cell submitted for [facility] testing to determine possible presence of targetable mutations -I have met with the patient and the family today: We have discussed a variety of topics including the present diagnosis of non-small cell lung carcinoma with adenocarcinoma morphology, fact that we are dealing with an incurable, stage IV disease for which palliative treatments are available with purpose of symptom reduction, life prolongation, but no cure.  At this time, family is interested in pursuing the treatments.  She will testing is necessary as outlined above to assess whether the more targeted approach is possible as the patient has higher than average chance of having EGFR mutation being a non-smoker.  Additionally, I have reviewed the CODE STATUS with the patient's family.  I have outlined that in case of cardiorespiratory arrest in a patient with stage IV cancer, likelihood of successful resuscitation leading to full functional recovery is minuscule.  Aggressive treatments with high chance of introducing injury to the patient's body and diminishing the quality of life for the remainder of her survival,  are generally futile in this situation.  I was very specific to stress that converting her CODE STATUS to DNR/DNI will not limit availability of cancer-directed therapies with the patient. -I will continue follow-up with the patient and her family daily.     LOS: 7 days   Ardath Sax, MD   09/07/2017, 1:17 PM

## 2017-09-08 ENCOUNTER — Ambulatory Visit
Admit: 2017-09-08 | Discharge: 2017-09-08 | Disposition: A | Discharge status: Home / Self Care | Payer: Medicare HMO | Attending: Radiation Oncology | Admitting: Radiation Oncology

## 2017-09-08 ENCOUNTER — Inpatient Hospital Stay (HOSPITAL_COMMUNITY): Payer: Medicare HMO

## 2017-09-08 DIAGNOSIS — C7949 Secondary malignant neoplasm of other parts of nervous system: Secondary | ICD-10-CM | POA: Insufficient documentation

## 2017-09-08 LAB — CBC
HCT: 35.7 % — ABNORMAL LOW (ref 36.0–46.0)
Hemoglobin: 11.8 g/dL — ABNORMAL LOW (ref 12.0–15.0)
MCH: 28.7 pg (ref 26.0–34.0)
MCHC: 33.1 g/dL (ref 30.0–36.0)
MCV: 86.9 fL (ref 78.0–100.0)
PLATELETS: 242 10*3/uL (ref 150–400)
RBC: 4.11 MIL/uL (ref 3.87–5.11)
RDW: 13.5 % (ref 11.5–15.5)
WBC: 12.8 10*3/uL — AB (ref 4.0–10.5)

## 2017-09-08 LAB — HEPARIN LEVEL (UNFRACTIONATED): HEPARIN UNFRACTIONATED: 0.38 [IU]/mL (ref 0.30–0.70)

## 2017-09-08 MED ORDER — LIDOCAINE 5 % EX PTCH
1.0000 | MEDICATED_PATCH | CUTANEOUS | Status: DC
Start: 2017-09-08 — End: 2017-09-15
  Administered 2017-09-08 – 2017-09-14 (×6): 1 via TRANSDERMAL
  Filled 2017-09-08 (×8): qty 1

## 2017-09-08 MED ORDER — SODIUM CHLORIDE 0.9 % IV SOLN
90.0000 mg | Freq: Once | INTRAVENOUS | Status: AC
  Administered 2017-09-08: 90 mg via INTRAVENOUS
  Filled 2017-09-08: qty 10

## 2017-09-08 MED ORDER — PANTOPRAZOLE SODIUM 40 MG IV SOLR
40.0000 mg | INTRAVENOUS | Status: DC
  Administered 2017-09-08 – 2017-09-11 (×4): 40 mg via INTRAVENOUS
  Filled 2017-09-08 (×4): qty 40

## 2017-09-08 MED ORDER — LIDOCAINE HCL 1 % IJ SOLN
INTRAMUSCULAR | Status: AC
  Filled 2017-09-08: qty 10

## 2017-09-08 MED ORDER — ZOLEDRONIC ACID 4 MG/5ML IV CONC: 4.0000 mg | Freq: Once | INTRAVENOUS | Status: DC

## 2017-09-08 MED ORDER — OXYCODONE HCL 5 MG PO TABS
5.0000 mg | ORAL_TABLET | Freq: Once | ORAL | Status: AC
  Administered 2017-09-08: 5 mg via ORAL
  Filled 2017-09-08: qty 1

## 2017-09-08 MED ORDER — ACETAMINOPHEN 325 MG PO TABS: 650.0000 mg | ORAL_TABLET | Freq: Four times a day (QID) | ORAL | Status: DC

## 2017-09-08 MED ORDER — TRAZODONE HCL 50 MG PO TABS: 25.0000 mg | ORAL_TABLET | Freq: Every day | ORAL | Status: DC

## 2017-09-08 MED ORDER — MIRTAZAPINE 15 MG PO TBDP
15.0000 mg | ORAL_TABLET | Freq: Every day | ORAL | Status: DC
  Administered 2017-09-08 – 2017-09-12 (×5): 15 mg via ORAL
  Filled 2017-09-08 (×5): qty 1

## 2017-09-08 MED ORDER — FENTANYL 12 MCG/HR TD PT72
12.5000 ug | MEDICATED_PATCH | TRANSDERMAL | Status: DC
  Administered 2017-09-08 – 2017-09-14 (×3): 12.5 ug via TRANSDERMAL
  Filled 2017-09-08 (×3): qty 1

## 2017-09-08 MED ORDER — BISACODYL 10 MG RE SUPP: 10.0000 mg | Freq: Once | RECTAL | Status: AC

## 2017-09-08 MED ORDER — SENNA 8.6 MG PO TABS
2.0000 | ORAL_TABLET | Freq: Two times a day (BID) | ORAL | Status: DC
  Administered 2017-09-08 – 2017-09-13 (×7): 17.2 mg via ORAL
  Filled 2017-09-08 (×8): qty 2

## 2017-09-08 NOTE — Consult Note (Signed)
Consultation Note Date: 09/08/2017   Patient Name: Samantha [name]  DOB: 1945/11/17  MRN: [id number]  Age / Sex: 72 y.o., female  PCP: Patient, No Pcp Per Referring Physician: Marene Lenz, MD  Reason for Consultation: Establishing goals of care and Pain control  HPI/Patient Profile: 72 y.o. female  with past medical history of pneumonia, R arm pain with findings of lytic lesion on xray in December admitted on 08/31/2017 with nausea, and increasing R arm pain, and headache. 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 She was started on palliative radiation therapy to brain, spine and arm with plans to start chemotherapy after discharge. Headache, and nausea have improved, but arm pain persists. Palliative medicine consulted for assistance with pain control and GOC.    Clinical Assessment and Goals of Care: I have reviewed medical records including EPIC notes, labs and imaging, evaluated patient and met separately with patient's son and extended family.   Patient moaning in bed. Answers questions with one word answers. Is able to tell me she has pain in her arm. Tells me her name and she is in [place]. Cannot tell me why she is in the hospital. Her son tells me she received pain medication about 10 minutes ago after returning from radiation treatment. They can tell she is in pain because she moans very loudly and holds her side. When she is not in pain she stops moaning and is more interactive, but remains a bit confused, with some slowed thinking. Her pain is keeping her from sleeping.  She is also having some complaints of throat pain and what sounds like reflux. She is burping. She enjoys eating broth, and ice cream, but not much else.  She is no longer vomiting or complaining of nausea. They cannot recall her last BM. Per nursing there is no documented BM since admission.   I introduced Palliative Medicine as specialized medical care for people living with serious illness. It focuses on providing relief from the symptoms and stress of a serious illness. The goal is to improve quality of life for both the patient and the family.  We discussed a brief life review of the patient. 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 Her son notes that her work had contacted him noting some changes in her cognitive status. She had also been losing significant amounts of weight over the last several months.   As far as functional and nutritional status- she was independent prior to admission. Her son was surprised to hear that she had been told in January to contact an Oncologist due to findings of lytic lesions on her humerus, however, she had not told any of her family members about this, and she had declined to seek further treatment for what she was told was likely cancer.    We discussed their current illness and what it means in  the larger context of their on-going co-morbidities.  Natural disease trajectory and expectations at EOL were discussed. We discussed the palliative nature of current radiation treatment. There hope is that patient will have improvement in pain control and have a little more time of life. They do not want their family member to suffer.   I attempted to elicit values and goals of care important to the patient. She enjoys spending time with her family. Medical care and doctor visits were never very important to her. She frequently delayed or did not seek care.    The difference between aggressive medical intervention and comfort care was explained.   Advanced directives, concepts specific to code status, artifical feeding and hydration, and rehospitalization were considered and  discussed. Code status was discussed. Family is clear that they request Do Not Resuscitate status. They understand this to mean that if patient is not breathing or has no pulse, there is no code blue, no CPR, no intubation. Appreciate previous discussion with Dr. Lebron Conners regarding this issue as well.   Hospice and Palliative Care services outpatient were explained and offered.   Questions and concerns were addressed. The family was encouraged to call with questions or concerns.   Primary Decision Maker NEXT OF KIN- patient's son- Samantha [name]   SUMMARY OF RECOMMENDATIONS -Fentanyl TD 12.80mg/hr (equalanalgesic dosing based on patient's total opioid oral morphine equivalent usage of 32.'5mg'$ ) -Continue morphine '2mg'$  IV q IV q4 hr prn breakthrough pain -Mirtazapine orally disintegrating tablet '15mg'$  for pain adjuvant, sleep and appetite  -Zoledronic acid '4mg'$  IV once for bone pain from lytic lesion -Lidoderm patch to R arm q12 hrs, remove for 12 hrs, repeat -Protonix '40mg'$  IV daily for GI prophylaxis -Dulcolax suppository -Pt already on dexamethasone -DNR   Code Status/Advance Care Planning:  DNR  Palliative Prophylaxis:   Aspiration and Frequent Pain Assessment  Additional Recommendations (Limitations, Scope, Preferences):  Full Scope Treatment  Prognosis:    Unable to determine  Discharge Planning: To Be Determined  Primary Diagnoses: Present on Admission: . Intractable nausea and vomiting   I have reviewed the medical record, interviewed the patient and family, and examined the patient. The following aspects are pertinent.  Past Medical History:  Diagnosis Date  . Pneumonia 07/12/2013   Social History   Socioeconomic History  . Marital status: Married    Spouse name: None  . Number of children: 3  . Years of education: None  . Highest education level: None  Social Needs  . Financial resource strain: None  . Food insecurity - worry: None  . Food insecurity -  inability: None  . Transportation needs - medical: None  . Transportation needs - non-medical: None  Occupational History  . Occupation: Event Coordinator    Comment: [employer] Tobacco Use  . Smoking status: Never Smoker  . Smokeless tobacco: Never Used  Substance and Sexual Activity  . Alcohol use: No  . Drug use: No  . Sexual activity: None  Other Topics Concern  . None  Social History Narrative   Has a biological son and daughter, also has an adopted son.  She lives with her husband. No pets.    Family History  Problem Relation Age of Onset  . Stroke Mother        deceased  . Diabetes Mellitus II Mother        deceased  . Hypertension Mother   . Prostate cancer Father    Scheduled Meds: . bisacodyl  10 mg  Rectal Once  . dexamethasone  8 mg Intravenous Q8H  . feeding supplement (ENSURE ENLIVE)  237 mL Oral BID BM  . fentaNYL  12.5 mcg Transdermal Q72H  . lidocaine  1 patch Transdermal Q24H  . lidocaine      . pantoprazole (PROTONIX) IV  40 mg Intravenous Q24H  . polyethylene glycol  17 g Oral BID  . senna  1 tablet Oral Daily  . sodium chloride flush  3 mL Intravenous Q12H   Continuous Infusions: . sodium chloride    . heparin 750 Units/hr (09/04/17 2049)  . zoledronic acid (ZOMETA) IV     PRN Meds:.sodium chloride, albuterol, lidocaine, morphine injection, ondansetron **OR** ondansetron (ZOFRAN) IV, sodium chloride flush, zolpidem Medications Prior to Admission:  Prior to Admission medications   Medication Sig Start Date End Date Taking? Authorizing Provider  acetaminophen (TYLENOL) 500 MG tablet Take 500 mg by mouth every 6 (six) hours as needed for moderate pain.   Yes [provider]  ondansetron (ZOFRAN ODT) 4 MG disintegrating tablet Take 1 tablet (4 mg total) by mouth every 8 (eight) hours as needed for nausea or vomiting. 08/07/17  Yes Gareth Morgan, MD  oxyCODONE-acetaminophen (PERCOCET/ROXICET) 5-325 MG tablet Take 1 tablet by mouth  2 (two) times daily as needed for moderate pain or severe pain.   Yes [provider]  Rivaroxaban 15 & 20 MG TBPK Take 1 tablet by mouth 2 (two) times daily. Take as directed on package: Start with one '15mg'$  tablet by mouth twice a day with food. On Day 22, switch to one '20mg'$  tablet once a day with food.   Yes [provider]   No Known Allergies Review of Systems  Constitutional: Positive for appetite change, fatigue and unexpected weight change.  Psychiatric/Behavioral: Positive for sleep disturbance.    Physical Exam  Cardiovascular: Normal rate.  Pulmonary/Chest: Effort normal.  Abdominal: Soft. Bowel sounds are normal.  Musculoskeletal: She exhibits tenderness (RUE).  Neurological:  Awake, lethargic, oriented to person and place only  Skin: Skin is warm and dry.  Nursing note and vitals reviewed.   Vital Signs: BP (!) 152/72 (BP Location: Left Arm)   Pulse 77   Temp 98.4 F (36.9 C) (Oral)   Resp 12   Ht '5\' 1"'$  (1.549 m)   Wt 73.5 kg (162 lb 0.6 oz)   SpO2 94%   BMI 30.62 kg/m  Pain Assessment: 0-10 POSS *See Group Information*: S-Acceptable,Sleep, easy to arouse Pain Score: 6    SpO2: SpO2: 94 % O2 Device:SpO2: 94 % O2 Flow Rate: .   IO: Intake/output summary:   Intake/Output Summary (Last 24 hours) at 09/08/2017 1600 Last data filed at 09/07/2017 2218 Gross per 24 hour  Intake 120 ml  Output 600 ml  Net -480 ml    LBM: Last BM Date: (PTA) Baseline Weight: Weight: 78.5 kg (173 lb) Most recent weight: Weight: 73.5 kg (162 lb 0.6 oz)     Palliative Assessment/Data: PPS: 20%     Thank you for this consult. Palliative medicine will continue to follow and assist as needed.   Time In: 1500 Time Out: 1700 Time Total: 120 minutes Prolonged services billed: Yes Greater than 50%  of this time was spent counseling and coordinating care related to the above assessment and plan.  Signed by: Mariana Kaufman, AGNP-C Palliative Medicine    Please  contact Palliative Medicine Team phone at [phone] for questions and concerns.  For individual provider: See Shea Evans

## 2017-09-08 NOTE — Progress Notes (Addendum)
Chaplain responding to RN referral.   Pt's son, Shanon Brow, wishing to speak about Advance Directive.  On chaplain arrival, Shanon Brow not present in room.  Pt had just returned from radiation.  Pt. able to give one-word answers to yes or no questions.  Unable to engage in conversation further.  Acknowledged she and Shanon Brow wish to speak about Adv. Dir.   Pt not able to orient to converse about Adv Dir. At this time .    Chaplain spoke with Shanon Brow in waiting area who states Pt has been foggy and is capable of one-word answers, but he is unsure if she will be able to display competency to notarize.    Shanon Brow stated that  pt. indicated that she wished Shanon Brow to be her health care power of attorney in conversation with RN Maudie Mercury and Dr. Lebron Conners yesterday, 2/7.   Chaplain informed Shanon Brow that in absense of Adv. Dir. Pt's next of kin would be legal HCPOA.  This is Shanon Brow and his sister.    Reported same to RN.  Please page if pt is oriented and able to display competency.    [facility] / [place], MDiv

## 2017-09-08 NOTE — Progress Notes (Signed)
ANTICOAGULATION CONSULT NOTE - Follow Up Consult  Pharmacy Consult for Heparin Indication: hx recent DVT  No Known Allergies  Patient Measurements: Height: 5\' 1"  (154.9 cm) Weight: 162 lb 0.6 oz (73.5 kg) IBW/kg (Calculated) : 47.8 Heparin Dosing Weight: 63.1 kg  Vital Signs: Temp: 98.4 F (36.9 C) (02/08 0438) Temp Source: Oral (02/08 0438) BP: 152/72 (02/08 0438) Pulse Rate: 77 (02/08 0438)  Labs: Recent Labs    09/06/17 0410  09/06/17 0808 09/07/17 0409 09/08/17 0405  HGB  --    < > 12.4 12.3 11.8*  HCT  --   --  37.9 37.4 35.7*  PLT  --   --  276 262 242  HEPARINUNFRC 0.37  --   --  0.41 0.38  CREATININE 0.68  --   --   --   --    < > = values in this interval not displayed.    Estimated Creatinine Clearance: 60.9 mL/min (by C-G formula based on SCr of 0.68 mg/dL).   Medications:  Infusions:  . sodium chloride    . heparin 750 Units/hr (09/04/17 2049)    Assessment: 72 yo F with hx recent R arm DVT on xarelto PTA (on second week of treatment on admission), presented on 1/31 with R pleural effusion and lung lesion. Xarelto was held for thoracentesis on 2/1; last Xarelto dose was 1/31 at 0800.  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  Plan is to start chemotherapy after XRT. Patient's currently on heparin while Xarelto on hold.  Today, 09/08/2017: - heparin level remains therapeutic at 0.38 - hgb down slightly, plts ok - no bleeding documented  Goal of Therapy:  Heparin level 0.3-0.7 units/mL Monitor platelets by anticoagulation protocol: Yes   Plan:  - Continue heparin drip at 750 units/hr - Daily heparin level and CBC while on heparin infusion - Continue to monitor for s/sx of bleeding - F/u when to transition back to [place], PharmD, BCPS 09/08/2017 11:14 AM

## 2017-09-08 NOTE — Procedures (Addendum)
Ultrasound-guided diagnostic and therapeutic right thoracentesis performed yielding 200 cc of slightly hazy, yellow fluid. No immediate complications. Follow-up chest x-ray pending.The fluid was sent to the lab for cytology/Foundation One testing. The right pleural effusion was small by today's [place].

## 2017-09-08 NOTE — Progress Notes (Signed)
Occupational Therapy Treatment Patient Details Name: Samantha [name] MRN: [id number] DOB: 1946-02-27 Today's Date: 09/08/2017    History of present illness 72 year old female admitted on 08/31/2017 patient was found to have what appears to be a widely metastatic malignancy including right lower lobe mass measuring at least 6 cm with associated mediastinal lymphadenopathy, multifocal skeletal lesions including destructive lesion in the right humerus and several vertebral lesions without pathological fracture in addition to a new finding of leptomeningeal enhancement on MRI of the brain suggestive of metastatic disease there.    OT comments  Pt more verbally responsive but much slower to initiate today.  Minimal participation in ADL and performed SPT to [facility]   Follow Up Recommendations  Supervision/Assistance - 24 hour;SNF(vs.  If home, HHOT)    Equipment Recommendations  3 in 1 bedside commode(does not have endurance for showering at this time)    Recommendations for Other Services      Precautions / Restrictions Precautions Precautions: Fall Restrictions Weight Bearing Restrictions: No       Mobility Bed Mobility         Supine to sit: Mod assist Sit to supine: Mod assist   General bed mobility comments: assist to initiate. Assist for trunk for OOB and for legs for back to bed  Transfers Overall transfer level: Needs assistance Equipment used: None Transfers: Sit to/from [facility] Sit to Stand: Min guard Stand pivot transfers: Min guard       General transfer comment: counted for pt to initiate movement and tactile cue given to stand. Pt's steps awkward to [facility] and bed; decreased control sitting    Balance                                           ADL either performed or assessed with clinical judgement   ADL           Upper Body Bathing: Maximal assistance;Sitting   Lower Body Bathing: Maximal assistance;Sit to/from  stand   Upper Body Dressing : Maximal assistance;Sitting       Toilet Transfer: Minimal assistance;Stand-pivot;Regular Toilet;RW   Toileting- Clothing Manipulation and Hygiene: Maximal assistance;Total assistance;Sit to/from stand         General ADL Comments: performed ADL from EOB.  Pt needed much more help today. Was verbally more responsive but moved slowly     Vision       Perception     Praxis      Cognition Arousal/Alertness: Awake/alert Behavior During Therapy: WFL for tasks assessed/performed Overall Cognitive Status: Impaired/Different from baseline Area of Impairment: Attention;Memory;Safety/judgement;Problem solving                   Current Attention Level: Sustained Memory: Decreased short-term memory       Problem Solving: Requires verbal cues;Slow processing General Comments: decreased initiation; distracted by TV; slow initiation        Exercises     Shoulder Instructions       General Comments      Pertinent Vitals/ Pain       Pain Assessment: Faces Faces Pain Scale: Hurts even more Pain Location: RUE during adl Pain Descriptors / Indicators: Grimacing Pain Intervention(s): Limited activity within patient's tolerance;Monitored during session;Premedicated before session;Repositioned  Home Living  Prior Functioning/Environment              Frequency  Min 2X/week        Progress Toward Goals  OT Goals(current goals can now be found in the care plan section)   not progressing:  Will reassess/adjust goals on next visit if neede     Plan      Co-evaluation                 AM-PAC PT "6 Clicks" Daily Activity     Outcome Measure   Help from another person eating meals?: None Help from another person taking care of personal grooming?: A Little Help from another person toileting, which includes using toliet, bedpan, or urinal?: A Lot Help from  another person bathing (including washing, rinsing, drying)?: A Lot Help from another person to put on and taking off regular upper body clothing?: A Lot Help from another person to put on and taking off regular lower body clothing?: A Lot 6 Click Score: 15    End of Session    OT Visit Diagnosis: Muscle weakness (generalized) (M62.81)   Activity Tolerance Patient limited by fatigue   Patient Left in bed;with call bell/phone within reach;with bed alarm set;with family/visitor present   Nurse Communication          Time:  - 11:22-11:54    Charges:  [address]  [place], OTR/L [phone] 09/08/2017   [place] 09/08/2017, 12:30 PM

## 2017-09-08 NOTE — Progress Notes (Signed)
  Radiation Oncology         [phone] ________________________________  Name: HONORE WIPPERFURTH MRN: [id number]  Date: 09/05/2017  DOB: May 29, 1946  SIMULATION AND TREATMENT PLANNING NOTE  No diagnosis found.  DIAGNOSIS:  72 yo woman with brain mets, humeral met and t-spine mets from metastatic cancer.  NARRATIVE:  The patient was brought to the [place].  Identity was confirmed.  All relevant records and images related to the planned course of therapy were reviewed.  The patient freely provided informed written consent to proceed with treatment after reviewing the details related to the planned course of therapy. The consent form was witnessed and verified by the simulation staff.  Then, the patient was set-up in a stable reproducible  supine position for radiation therapy.  CT images were obtained.  Surface markings were placed.  The CT images were loaded into the planning software.  Then the target and avoidance structures were contoured.  Treatment planning then occurred.  The radiation prescription was entered and confirmed.  Then, I designed and supervised the construction of a total of 7 medically necessary complex treatment devices.  I have requested : Isodose Plan.    PLAN:  The patient will receive 30 Gy in 10 fractions to the whole brain, proximal right humerus and T6 spine.  ________________________________  Sheral Apley. Tammi Klippel, M.D.

## 2017-09-08 NOTE — Progress Notes (Signed)
This CM attempted to meet with pt again at bedside to discuss disposition planning. Multiple family members in the room. Family states they are waiting on having a family meeting at 3pm. This CM left home health provider list with family if home health services is what the plan is. Marney Doctor RN,BSN,NCM [phone]

## 2017-09-08 NOTE — Progress Notes (Signed)
Palliative Medicine consult noted. Due to high referral volume, there may be a delay seeing this patient. Please call the Palliative Medicine Team office at [phone] if recommendations are needed in the interim.  Thank you for inviting [place] to see this patient.  Marjie Skiff Hermila Millis, RN, BSN, [facility] Palliative Medicine Team 09/08/2017 9:37 AM Office [phone]

## 2017-09-08 NOTE — Progress Notes (Signed)
PT Cancellation Note  Patient Details Name: Samantha [name] MRN: [id number] DOB: Dec 04, 1945   Cancelled Treatment:     out of room for test.  Will check back as schedule permits   Rica Koyanagi  PTA [facility]  Acute  Rehab Pager      [phone]

## 2017-09-08 NOTE — Progress Notes (Signed)
PROGRESS NOTE    ALENE BERGERSON  VOJ:[id number] DOB: 1946/01/24 DOA: 08/31/2017 PCP: Patient, No Pcp Per   Brief Narrative: Patient is 72 year old female admitted on 08/31/2017 .She was noted to have pain in her right shoulder for 2 weeks and was seen in the emergency department on 07/29/17 and was found to have a lytic lesion in her right humerus as well as lower extremity DVT .she was referred for workup and was seen again in the emergency department on 08/31/17 with increased shoulder pain, shortness of breath and nausea with vomiting.  Patient was found to have what appears to be a widely metastatic malignancy including right lower lobe lung  mass measuring at least 6 cm with associated mediastinal lymphadenopathy, multifocal skeletal lesions including destructive lesion in the right humerus and several vertebral lesions without pathological fracture and  in addition to a new finding of leptomeningeal enhancement on MRI of the brain suggestive of metastatic disease there.  She also had large right pleural effusion. No focal CNS disease at this time.  Transferred from [facility] to [facility] on 09/02/2017 for possible radiation therapy.She has been started on Xarelto for DVT . Cytology from thoracentesis of 09-13-17 and CSF both showed malignant cells consistent with metastatic adenocarcinoma from lung. Patient has been started on radiation therapy.  Plan is to start on chemotherapy soon. Repeat thoracocentesis was done today to send the fluid for cytology.    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 Patient has a right lung mass and right pleural effusion . MRI of the thoracic and lumbar spine with and without contrast pending does not indicate additional foci of leptomeningeal disease, however it does show foci of bony metastasis. Started on  dexamethasone .Radiation oncology following.  Patient underwent CT radiation stimulation on 09/05/2017 prior to radiation therapy for leptomeningeal disease.Plan is to start on chemotherapy following radiotherapy  MRI result previously reviewed with Dr Annette Stable, neurosurgery, does not appears that patient has any urgent neurosurgical needs, if patient fail to improve with radiation therapy then she might need further neurosurgery evaluation.  Pleural Fluid sent after repeat thoracentesis on 09/08/17 to repeat the cytology for adequate tissue diagnosis. We have also consulted palliative care for pain management.   Right humerus with multiple lytic lesions: Continue current regimen. On radiation therapy. anticipate improvement of her pain about 1.5 -2 weeks into the course of her treatment.  Right upper extremity XHB:ZJIRCVE on hold, c/n  IV heparin for now, Resume Xarelto once surgical procedures (biposy/paracentesis)are completed.We will resume Xarelto in next few days  Headaches with nausea and vomiting:Secondary to leptomeningeal disease in the setting of metastatic lung cancer, improved with Decadron  Social/Ethics:given intracranial findings/mets and need for radiation therapy, patient and patient's son concerned about cognitive deficits, patient would like her son to become her 70,.Pastoral  services and case management consult requested to help facilitate advanced directive and power of attorney documentation/paperwork. D/w Pt's son Mr. Scottlynn Lindell - [phone].     DVT prophylaxis: IV heparin Code Status: Full Family Communication: Family present at the bedside Disposition Plan: Unknown at this time  Consultants: Radiation  oncology, oncology  Procedures: Radiation therapy  Antimicrobials: None  Subjective: Patient seen and examined the bedside this morning.  Remains comfortable.  Underwent paracentesis today.  Objective: Vitals:   09/07/17 0459 09/07/17 1423 09/07/17 2022 09/08/17 0438  BP: (!) 154/92 (!) 147/81 (!) 159/85 (!) 152/72  Pulse: 63 78 67 77  Resp: 12 16 14 12   Temp: 98.1 F (36.7 C) 97.6 F (36.4 C) 98.3 F (36.8 C) 98.4 F (36.9 C)  TempSrc: Oral Oral Oral Oral  SpO2: 97% 95% 97% 94%  Weight: 77 kg (169 lb 12.1 oz)   73.5 kg (162 lb 0.6 oz)  Height:        Intake/Output Summary (Last 24 hours) at 09/08/2017 1402 Last data filed at 09/07/2017 2218 Gross per 24 hour  Intake 240 ml  Output 600 ml  Net -360 ml   Filed Weights   09/02/17 2129 09/07/17 0459 09/08/17 0438  Weight: 70.8 kg (156 lb 1.4 oz) 77 kg (169 lb 12.1 oz) 73.5 kg (162 lb 0.6 oz)    Examination:  General exam: Appears calm and comfortable ,Not in distress,average built Respiratory system: Bilateral equal air entry, normal vesicular breath sounds, no wheezes or crackles  Cardiovascular system: S1 & S2 heard, RRR. No JVD, murmurs, rubs, gallops or clicks.  Gastrointestinal system: Abdomen is nondistended, soft and nontender. No organomegaly or masses felt. Normal bowel sounds heard. Central nervous system: Alert and oriented. No focal neurological deficits. Extremities: No edema, no clubbing ,no cyanosis, distal peripheral pulses palpable. Tenderness on the right upper extremity near shoulder Skin: No cyanosis,No pallor,No Rash,No Ulcer Psychiatry: Judgement and insight appear normal. Mood & affect appropriate.  GU: No Foley  Data Reviewed: I have personally reviewed following labs and imaging studies  CBC: Recent Labs  Lab 09/04/17 0001 09/05/17 0423 09/06/17 0808 09/07/17 0409 09/08/17 0405  WBC 12.5* 9.1 12.4* 13.7* 12.8*  NEUTROABS  --   --  11.0*  --   --   HGB 12.4 12.2 12.4 12.3 11.8*  HCT  37.3 37.6 37.9 37.4 35.7*  MCV 87.6 88.7 87.9 89.0 86.9  PLT 263 254 276 262 716   Basic Metabolic Panel: Recent Labs  Lab 09/02/17 0432 09/06/17 0410  NA 136 135  K 4.6 4.3  CL 103 104  CO2 21* 24  GLUCOSE 111* 133*  BUN 14 25*  CREATININE 0.80 0.68  CALCIUM 9.2 9.3   GFR: Estimated Creatinine Clearance: 60.9 mL/min (by C-G formula based on SCr of 0.68 mg/dL). Liver Function Tests: No results for input(s): AST, ALT, ALKPHOS, BILITOT, PROT, ALBUMIN in the last 168 hours. No results for input(s): LIPASE, AMYLASE in the last 168 hours. No results for input(s): AMMONIA in the last 168 hours. Coagulation Profile: No results for input(s): INR, PROTIME in the last 168 hours. Cardiac Enzymes: No results for input(s): CKTOTAL, CKMB, CKMBINDEX, TROPONINI in the last 168 hours. BNP (last 3 results) No results for input(s): PROBNP in the last 8760 hours. HbA1C: No results for input(s): HGBA1C in the last 72 hours. CBG: No results for input(s): GLUCAP in the last 168 hours. Lipid Profile: No results for input(s): CHOL, HDL, LDLCALC, TRIG, CHOLHDL, LDLDIRECT in the last 72 hours. Thyroid Function Tests: No results for input(s): TSH, T4TOTAL, FREET4, T3FREE, THYROIDAB in the last 72 hours. Anemia Panel: No results for input(s): VITAMINB12, FOLATE, FERRITIN, TIBC, IRON, RETICCTPCT in the last 72 hours. Sepsis Labs: No results for input(s): PROCALCITON, LATICACIDVEN in the last 168 hours.  Recent Results (from the past 240 hour(s))  Acid Fast Smear (AFB)     Status: None   Collection Time: 09/04/2017  9:21 AM  Result Value Ref Range Status   AFB Specimen Processing Concentration  Final   Acid Fast Smear Negative  Final    Comment: (NOTE) Performed At: [facility] [place], [place] [id number] Rush Farmer MD SU:[id number]    Source (AFB) PLEURAL  Final    Comment: RIGHT Performed at [facility], [place] [address] [place], [place] [address]    Culture, body fluid-bottle     Status: None   Collection Time: 09/04/2017  9:21 AM  Result Value Ref Range Status   Specimen Description PLEURAL RIGHT  Final   Special Requests NONE  Final   Culture   Final    NO GROWTH 5 DAYS Performed at [facility] [address] [place], [place] [address]    Report Status 09/06/2017 FINAL  Final  Gram stain     Status: None   Collection Time: 09-04-17  9:21 AM  Result Value Ref Range Status   Specimen Description PLEURAL RIGHT  Final   Special Requests NONE  Final   Gram Stain   Final    RARE WBC PRESENT, PREDOMINANTLY MONONUCLEAR NO ORGANISMS SEEN Performed at [facility], [place] [address], [place], [place] [address]    Report Status 2017-09-04 FINAL  Final         Radiology Studies: Dg Chest 2 View  Result Date: 09/07/2017 CLINICAL DATA:  Followup right-sided pleural effusion EXAM: CHEST  2 VIEW COMPARISON:  2017-09-04 chest x-ray and chest CT, MRI of the right humerus dated 09/04/2017 FINDINGS: Cardiac shadow is stable. The lungs are well aerated bilaterally. Persistent right lower lobe infiltrate with associated small effusion is noted. The overall appearance is stable from the prior exam. The osseous changes in the thoracic spine are not well appreciated on this exam. Moth-eaten appearance to the proximal right humerus is noted similar to that seen on prior MRI examination. IMPRESSION: Stable right lower lobe consolidation and associated effusions similar to that seen on prior exam. Permeative lesion in the proximal humerus consistent with metastatic disease. Electronically Signed   By: Inez Catalina M.D.   On: 09/07/2017 16:32        Scheduled Meds: . bisacodyl  10 mg Rectal Once  . dexamethasone  8 mg Intravenous Q8H  . feeding supplement (ENSURE ENLIVE)  237 mL Oral BID BM  . lidocaine      . polyethylene glycol  17 g Oral BID  . senna  1 tablet Oral Daily  . sodium chloride flush  3 mL Intravenous Q12H    Continuous Infusions: . sodium chloride    . heparin 750 Units/hr (09/04/17 2049)     LOS: 8 days    Time spent: 25 mins    Nathaniel Wakeley Jodie Echevaria, MD [facility] Pager [phone]  If 7PM-7AM, please contact night-coverage [email or web address] Password [email or web address] 09/08/2017, 2:02 PM

## 2017-09-09 MED ORDER — SODIUM CHLORIDE 0.9 % IV SOLN
INTRAVENOUS | Status: DC
  Administered 2017-09-09 – 2017-09-15 (×8): via INTRAVENOUS

## 2017-09-09 MED ORDER — RIVAROXABAN 20 MG PO TABS
20.0000 mg | ORAL_TABLET | Freq: Every day | ORAL | Status: DC
  Administered 2017-09-09 – 2017-09-13 (×5): 20 mg via ORAL
  Filled 2017-09-09 (×6): qty 1

## 2017-09-09 NOTE — Progress Notes (Signed)
ANTICOAGULATION CONSULT NOTE - Follow Up Consult  Pharmacy Consult for Heparin>xarelto Indication: hx recent DVT  No Known Allergies  Patient Measurements: Height: 5\' 1"  (154.9 cm) Weight: 162 lb 0.6 oz (73.5 kg) IBW/kg (Calculated) : 47.8 Heparin Dosing Weight: 63.1 kg  Vital Signs: Temp: 98.5 F (36.9 C) (02/09 0600) Temp Source: Axillary (02/09 0600) BP: 151/70 (02/09 0600) Pulse Rate: 57 (02/09 0600)  Labs: Recent Labs    09/07/17 0409 09/08/17 0405  HGB 12.3 11.8*  HCT 37.4 35.7*  PLT 262 242  HEPARINUNFRC 0.41 0.38    Estimated Creatinine Clearance: 60.9 mL/min (by C-G formula based on SCr of 0.68 mg/dL).   Medications:  Infusions:  . sodium chloride      Assessment: 72 yo F with hx recent R arm DVT on xarelto PTA (on second week of treatment on admission), presented on 1/31 with R pleural effusion and lung lesion. Xarelto was held for thoracentesis on 2/1; last Xarelto dose was 1/31 at 0800.  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  Plan is to start chemotherapy after XRT. Patient's currently on heparin while Xarelto on hold.  Today, 09/09/2017: Lab unable to draw heparin level, per MD d/c heparin and change back to xarelto H/H slightly low on 2/8 Scr WNL Pt on cardiac diet  Goal of Therapy:  Per Scr and indication   Plan:  - stop heparin drip and labs - give xarelto 20mg  po once daily, (pt was on day 22 of starter pack but has been on therapeutic heparin drip for 8+ days) - Continue to monitor for s/sx of bleeding - pharmacy will sign of and follow peripherally  Dolly Rias RPh 09/09/2017, 8:31 AM Pager [phone]

## 2017-09-09 NOTE — Progress Notes (Signed)
PROGRESS NOTE    Samantha [name]  BDZ:[id number] DOB: 1946-05-26 DOA: 08/31/2017 PCP: Patient, No Pcp Per   Brief Narrative: Patient is 72 year old female admitted on 08/31/2017 .She was noted to have pain in her right shoulder for 2 weeks and was seen in the emergency department on 07/29/17 and was found to have a lytic lesion in her right humerus as well as lower extremity DVT .she was referred for workup and was seen again in the emergency department on 08/31/17 with increased shoulder pain, shortness of breath and nausea with vomiting.  Patient was found to have what appears to be a widely metastatic malignancy including right lower lobe lung  mass measuring at least 6 cm with associated mediastinal lymphadenopathy, multifocal skeletal lesions including destructive lesion in the right humerus and several vertebral lesions without pathological fracture and  in addition to a new finding of leptomeningeal enhancement on MRI of the brain suggestive of metastatic disease there.  She also had large right pleural effusion. No focal CNS disease at this time.  Transferred from [facility] to [facility] on 09/02/2017 for possible radiation therapy.She has been started on Xarelto for DVT . Cytology from thoracentesis of September 04, 2017 and CSF both showed malignant cells consistent with metastatic adenocarcinoma from lung. Patient has been started on radiation therapy.  Plan is to start on chemotherapy soon. Repeat thoracocentesis was done on 09/08/17 to send the fluid for cytology.    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 Patient has a right lung mass and right pleural effusion . MRI of the thoracic and lumbar spine with and without contrast pending does not indicate additional foci of leptomeningeal disease, however it does show foci of bony metastasis. Started on  dexamethasone .Radiation oncology following.  Patient underwent CT radiation stimulation on 09/05/2017 prior to radiation therapy for leptomeningeal disease.Plan is to start on chemotherapy following radiotherapy  MRI result previously reviewed with Dr Annette Stable, neurosurgery, does not appears that patient has any urgent neurosurgical needs.  Pleural Fluid sent after repeat thoracentesis on 09/08/17 to repeat the cytology for adequate tissue diagnosis. Palliative care also following  for pain management.   Right humerus with multiple lytic lesions: Continue current regimen. On radiation therapy. anticipate improvement of her pain .  Right upper extremity TMH:DQQIWLN resumed  Headaches with nausea and vomiting:Secondary to leptomeningeal disease in the setting of metastatic lung cancer, improved with Decadron.Currently these symptoms have resolved.  Social/Ethics:Given intracranial findings/mets and need for radiation therapy, patient and patient's son concerned about cognitive deficits, patient would like her son to become her 45,.Pastoral  services and case management consult requested to help facilitate advanced directive and power of attorney documentation/paperwork. D/w Pt's son Mr. Brandii Lakey - [phone].   Awaiting further recommendation from oncology/radiation oncology for discharge planning.  DVT prophylaxis: Xarelto Code Status: Full Family Communication: Family present at the bedside Disposition Plan: Unknown at this time.Will F/U with oncology/bradycardia oncology   Consultants: Radiation oncology, oncology  Procedures: Radiation therapy  Antimicrobials: None  Subjective: Patient seen and examined the bedside this  morning.  Remains comfortable.  No new issues/events  Objective: Vitals:   09/08/17 1746 09/08/17 2316 09/09/17 0600 09/09/17 1323  BP: (!) 155/86 (!) 165/82 (!) 151/70 (!) 149/72  Pulse: 73 65 (!) 57 64  Resp: 16 20 16 16   Temp: 98.1 F (36.7 C) 98.5 F (36.9 C) 98.5 F (36.9 C) 98.1 F (36.7 C)  TempSrc: Oral Axillary Axillary Axillary  SpO2: 98% 100% 95% 100%  Weight:      Height:        Intake/Output Summary (Last 24 hours) at 09/09/2017 1326 Last data filed at 09/09/2017 1030 Gross per 24 hour  Intake 180 ml  Output -  Net 180 ml   Filed Weights   09/02/17 2129 09/07/17 0459 09/08/17 0438  Weight: 70.8 kg (156 lb 1.4 oz) 77 kg (169 lb 12.1 oz) 73.5 kg (162 lb 0.6 oz)    Examination:  General exam: Appears calm and comfortable ,Not in distress,average built HEENT:PERRL,Oral mucosa moist, Ear/Nose normal on gross exam Respiratory system: Bilateral equal air entry, normal vesicular breath sounds, no wheezes or crackles  Cardiovascular system: S1 & S2 heard, RRR. No JVD, murmurs, rubs, gallops or clicks. No pedal edema. Gastrointestinal system: Abdomen is nondistended, soft and nontender. No organomegaly or masses felt. Normal bowel sounds heard. Central nervous system: Alert and oriented. No focal neurological deficits. Extremities: No edema, no clubbing ,no cyanosis, distal peripheral pulses palpable. Skin: No rashes, lesions or ulcers,no icterus ,no pallor MSK: Normal muscle bulk,tone ,power Psychiatry: Judgement and insight appear normal. Mood & affect appropriate.    Data Reviewed: I have personally reviewed following labs and imaging studies  CBC: Recent Labs  Lab 09/04/17 0001 09/05/17 0423 09/06/17 0808 09/07/17 0409 09/08/17 0405  WBC 12.5* 9.1 12.4* 13.7* 12.8*  NEUTROABS  --   --  11.0*  --   --   HGB 12.4 12.2 12.4 12.3 11.8*  HCT 37.3 37.6 37.9 37.4 35.7*  MCV 87.6 88.7 87.9 89.0 86.9  PLT 263 254 276 262 956   Basic Metabolic Panel: Recent  Labs  Lab 09/06/17 0410  NA 135  K 4.3  CL 104  CO2 24  GLUCOSE 133*  BUN 25*  CREATININE 0.68  CALCIUM 9.3   GFR: Estimated Creatinine Clearance: 60.9 mL/min (by C-G formula based on SCr of 0.68 mg/dL). Liver Function Tests: No results for input(s): AST, ALT, ALKPHOS, BILITOT, PROT, ALBUMIN in the last 168 hours. No results for input(s): LIPASE, AMYLASE in the last 168 hours. No results for input(s): AMMONIA in the last 168 hours. Coagulation Profile: No results for input(s): INR, PROTIME in the last 168 hours. Cardiac Enzymes: No results for input(s): CKTOTAL, CKMB, CKMBINDEX, TROPONINI in the last 168 hours. BNP (last 3 results) No results for input(s): PROBNP in the last 8760 hours. HbA1C: No results for input(s): HGBA1C in the last 72 hours. CBG: No results for input(s): GLUCAP in the last 168 hours. Lipid Profile: No results for input(s): CHOL, HDL, LDLCALC, TRIG, CHOLHDL, LDLDIRECT in the last 72 hours. Thyroid Function Tests: No results for input(s): TSH, T4TOTAL, FREET4, T3FREE, THYROIDAB in the last 72 hours. Anemia Panel: No results for input(s): VITAMINB12, FOLATE, FERRITIN, TIBC, IRON, RETICCTPCT in the last 72 hours. Sepsis Labs: No results for input(s): PROCALCITON, LATICACIDVEN in the last 168 hours.  Recent Results (from the past 240 hour(s))  Acid Fast Smear (AFB)     Status: None   Collection Time: 09-07-2017  9:21 AM  Result Value Ref Range Status   AFB Specimen Processing Concentration  Final  Acid Fast Smear Negative  Final    Comment: (NOTE) Performed At: [facility] [place], [place] [id number] Rush Farmer MD HD:[id number]    Source (AFB) PLEURAL  Final    Comment: RIGHT Performed at [facility], [place] [address], [place], [place] [address]   Culture, body fluid-bottle     Status: None   Collection Time: 2017/09/07  9:21 AM  Result Value Ref Range Status   Specimen Description PLEURAL RIGHT  Final   Special  Requests NONE  Final   Culture   Final    NO GROWTH 5 DAYS Performed at [facility] [address], [place], [place] [address]    Report Status 09/06/2017 FINAL  Final  Gram stain     Status: None   Collection Time: 09-07-17  9:21 AM  Result Value Ref Range Status   Specimen Description PLEURAL RIGHT  Final   Special Requests NONE  Final   Gram Stain   Final    RARE WBC PRESENT, PREDOMINANTLY MONONUCLEAR NO ORGANISMS SEEN Performed at [facility], [place] [address] [place],  [address]    Report Status 09-07-2017 FINAL  Final         Radiology Studies: Dg Chest 1 View  Result Date: 09/08/2017 CLINICAL DATA:  Status post thoracentesis. EXAM: CHEST 1 VIEW COMPARISON:  09/07/2017 FINDINGS: The cardiomediastinal silhouette is unchanged. Lung volumes are low with slight elevation of the right hemidiaphragm. Right pleural effusion has decreased in size following thoracentesis without a sizable amount of residual fluid apparent on this AP radiograph. Masslike opacity is again seen in the basilar right lower lobe, previously evaluated with CT. The left lung is clear. No pneumothorax is identified. A destructive lesion is again noted in the proximal right humerus. IMPRESSION: 1. Decreased size of right pleural effusion following thoracentesis without pneumothorax. 2. Right lower lobe masslike opacity as previously seen. Electronically Signed   By: Logan Bores M.D.   On: 09/08/2017 14:42   Dg Chest 2 View  Result Date: 09/07/2017 CLINICAL DATA:  Followup right-sided pleural effusion EXAM: CHEST  2 VIEW COMPARISON:  September 07, 2017 chest x-ray and chest CT, MRI of the right humerus dated 09/04/2017 FINDINGS: Cardiac shadow is stable. The lungs are well aerated bilaterally. Persistent right lower lobe infiltrate with associated small effusion is noted. The overall appearance is stable from the prior exam. The osseous changes in the thoracic spine are not well appreciated on this exam.  Moth-eaten appearance to the proximal right humerus is noted similar to that seen on prior MRI examination. IMPRESSION: Stable right lower lobe consolidation and associated effusions similar to that seen on prior exam. Permeative lesion in the proximal humerus consistent with metastatic disease. Electronically Signed   By: Inez Catalina M.D.   On: 09/07/2017 16:32   US Thoracentesis Asp Pleural Space W/img Guide  Result Date: 09/08/2017 INDICATION: Patient with history of metastatic adenocarcinoma of probable lung primary, recurrent malignant right pleural effusion. Request made for diagnostic and therapeutic right thoracentesis. EXAM: ULTRASOUND GUIDED DIAGNOSTIC AND THERAPEUTIC RIGHT THORACENTESIS MEDICATIONS: None. COMPLICATIONS: None immediate. PROCEDURE: An ultrasound guided thoracentesis was thoroughly discussed with the patient and questions answered. The benefits, risks, alternatives and complications were also discussed. The patient understands and wishes to proceed with the procedure. Written consent was obtained. Ultrasound was performed to localize and mark an adequate pocket of fluid in the right chest. The area was then prepped and draped in the normal sterile fashion.  1% Lidocaine was used for local anesthesia. Under ultrasound guidance a 6 Fr Safe-T-Centesis catheter was introduced. Thoracentesis was performed. The catheter was removed and a dressing applied. FINDINGS: A total of approximately 200 cc of slightly hazy, yellow fluid was removed. Samples were sent to the laboratory as requested by the clinical team. The right pleural effusion was small by today's ultrasound. IMPRESSION: Successful ultrasound guided diagnostic and therapeutic right thoracentesis yielding 200 cc of pleural fluid. Follow-up chest x-ray revealed no pneumothorax. Read by: Rowe Robert, PA-C Electronically Signed   By: Corrie Mckusick D.O.   On: 09/08/2017 14:58        Scheduled Meds: . dexamethasone  8 mg Intravenous  Q8H  . feeding supplement (ENSURE ENLIVE)  237 mL Oral BID BM  . fentaNYL  12.5 mcg Transdermal Q72H  . lidocaine  1 patch Transdermal Q24H  . mirtazapine  15 mg Oral QHS  . pantoprazole (PROTONIX) IV  40 mg Intravenous Q24H  . polyethylene glycol  17 g Oral BID  . rivaroxaban  20 mg Oral Daily  . senna  2 tablet Oral BID  . sodium chloride flush  3 mL Intravenous Q12H   Continuous Infusions: . sodium chloride       LOS: 9 days    Time spent: 25 mins    Jannis Atkins Jodie Echevaria, MD [facility] Pager [phone]  If 7PM-7AM, please contact night-coverage [email or web address] Password [email or web address] 09/09/2017, 1:26 PM

## 2017-09-10 DIAGNOSIS — C7949 Secondary malignant neoplasm of other parts of nervous system: Secondary | ICD-10-CM

## 2017-09-10 DIAGNOSIS — Z515 Encounter for palliative care: Secondary | ICD-10-CM

## 2017-09-10 DIAGNOSIS — C3490 Malignant neoplasm of unspecified part of unspecified bronchus or lung: Secondary | ICD-10-CM

## 2017-09-10 DIAGNOSIS — Z7189 Other specified counseling: Secondary | ICD-10-CM

## 2017-09-10 LAB — CBC WITH DIFFERENTIAL/PLATELET
BASOS ABS: 0 10*3/uL (ref 0.0–0.1)
Basophils Relative: 0 %
EOS PCT: 0 %
Eosinophils Absolute: 0 10*3/uL (ref 0.0–0.7)
HCT: 36.9 % (ref 36.0–46.0)
Hemoglobin: 12.3 g/dL (ref 12.0–15.0)
LYMPHS ABS: 0.6 10*3/uL — AB (ref 0.7–4.0)
LYMPHS PCT: 5 %
MCH: 29 pg (ref 26.0–34.0)
MCHC: 33.3 g/dL (ref 30.0–36.0)
MCV: 87 fL (ref 78.0–100.0)
Monocytes Absolute: 0.6 10*3/uL (ref 0.1–1.0)
Monocytes Relative: 5 %
Neutro Abs: 9.7 10*3/uL — ABNORMAL HIGH (ref 1.7–7.7)
Neutrophils Relative %: 90 %
PLATELETS: 204 10*3/uL (ref 150–400)
RBC: 4.24 MIL/uL (ref 3.87–5.11)
RDW: 13.7 % (ref 11.5–15.5)
WBC: 10.9 10*3/uL — ABNORMAL HIGH (ref 4.0–10.5)

## 2017-09-10 MED ORDER — MORPHINE SULFATE (CONCENTRATE) 10 MG/0.5ML PO SOLN
5.0000 mg | ORAL | Status: DC | PRN
  Administered 2017-09-10 – 2017-09-15 (×17): 5 mg via ORAL
  Filled 2017-09-10 (×17): qty 0.5

## 2017-09-10 MED ORDER — AMLODIPINE BESYLATE 5 MG PO TABS
5.0000 mg | ORAL_TABLET | Freq: Every day | ORAL | Status: DC
  Administered 2017-09-10 – 2017-09-13 (×3): 5 mg via ORAL
  Filled 2017-09-10 (×5): qty 1

## 2017-09-10 NOTE — Progress Notes (Addendum)
Daily Progress Note   Patient Name: Samantha [name]       Date: 09/10/2017 DOB: [birth year]  Age: 72 y.o. MRN#: [id number] Attending Physician: Marene Lenz, MD Primary Care Physician: Patient, No Pcp Per Admit Date: 08/31/2017  Reason for Consultation/Follow-up: Establishing goals of care and Pain control  Subjective: Patient in bed. Notes pain in arm much better. Has not used prn morphine since 0503 today. Used total of six mg IV in the last 24 hrs. Family also feels pain is well controlled. She is having BM's again. Discussed switching to oral breakthrough pain medication. Will transition to Morphine concentrated solution '5mg'$  q 2hr prn for breakthrough pain. Continued to discuss [place] with family. Family feels patient needs to eat and walk more in order to be able to go home. Some family feels that she just wants to go home and not be bothered.  Discussed with family the importance of considering patient's values and goals of care when considering how to proceed forward with patient's treatment. They have expressed that in the past she was not fond of receiving medical care and did not enjoy being taken care of.  They are still awaiting results of foundation one testing to determine if further chemotherapy will even be possible. If patient does not have EGFR mutation, then she will not be candidate for systemic therapy.  After the meeting her daughter in law who was sitting in as a representative for the patient's son- pulled me aside and said the patient had said multiple times that she was tired, and she feels that patient may be saying that she is tired and doesn't want to continue with treatments. She is saying no to physical therapy, she isn't eating.   ROS  Length of Stay: 10  Current  Medications: Scheduled Meds:  . amLODipine  5 mg Oral Daily  . dexamethasone  8 mg Intravenous Q8H  . feeding supplement (ENSURE ENLIVE)  237 mL Oral BID BM  . fentaNYL  12.5 mcg Transdermal Q72H  . lidocaine  1 patch Transdermal Q24H  . mirtazapine  15 mg Oral QHS  . pantoprazole (PROTONIX) IV  40 mg Intravenous Q24H  . polyethylene glycol  17 g Oral BID  . rivaroxaban  20 mg Oral Daily  . senna  2 tablet Oral BID  .  sodium chloride flush  3 mL Intravenous Q12H    Continuous Infusions: . sodium chloride    . sodium chloride 75 mL/hr at 09/10/17 0504    PRN Meds: sodium chloride, albuterol, lidocaine, morphine injection, ondansetron **OR** ondansetron (ZOFRAN) IV, sodium chloride flush  Physical Exam  Cardiovascular: Normal rate.  Pulmonary/Chest: Effort normal.  Neurological:  Awake, answers questions with limited answers  Skin: Skin is warm and dry.  Psychiatric:  Flat affect  Nursing note and vitals reviewed.           Vital Signs: BP 140/67 (BP Location: Left Leg)   Pulse 60   Temp 98.2 F (36.8 C) (Oral)   Resp 18   Ht '5\' 1"'$  (1.549 m)   Wt 73.5 kg (162 lb 0.6 oz)   SpO2 99%   BMI 30.62 kg/m  SpO2: SpO2: 99 % O2 Device: O2 Device: Not Delivered O2 Flow Rate:    Intake/output summary: No intake or output data in the 24 hours ending 09/10/17 1608 LBM: Last BM Date: 09/09/17 Baseline Weight: Weight: 78.5 kg (173 lb) Most recent weight: Weight: 73.5 kg (162 lb 0.6 oz)       Palliative Assessment/Data: PPS: 20%     Patient Active Problem List   Diagnosis Date Noted  . 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 Non-small cell lung cancer (NSCLC) ([place])   . Bone metastases ([place])   . 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 Pleural effusion September 17, 2017  . Lytic bone lesions on xray September 17, 2017  . Lung mass Sep 17, 2017  . Intractable nausea and vomiting 08/31/2017  . Community acquired pneumonia 07/12/2013  . Normocytic anemia 07/12/2013     Palliative Care Assessment & Plan   Patient Profile: 72 y.o. female  with past medical history of pneumonia, R arm pain with findings of lytic lesion on xray in December admitted on 08/31/2017 with nausea, and increasing R arm pain, and headache. 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 She was started on palliative radiation therapy to brain, spine and arm with plans to start chemotherapy after discharge. Headache, and nausea have improved, but arm pain persists. Palliative medicine consulted for assistance with pain control and GOC.      Assessment/Recommendations/Plan   D/C IV Morphine  Morphine concentrated solution '5mg'$  q2hr prn breakthrough pain  Continue other pain medications and interventions as ordered  Shanon Brow and Latrice to call me to schedule f/u [place] discussion   PMT will continue to follow  Goals of Care and Additional Recommendations:  Limitations on Scope of Treatment: Full Scope Treatment  Code Status:  DNR  Prognosis:   Unable to determine  Discharge Planning:  To Be Determined  Care plan was discussed with patient's family  Thank you for allowing the Palliative Medicine Team to assist in the care of this patient.   Time In: 1430 Time Out: 1530 Total Time 60 mins Prolonged Time Billed yes      Greater than 50%  of this time was spent counseling and coordinating care related to the above assessment and plan.  Mariana Kaufman, AGNP-C Palliative Medicine   Please contact Palliative Medicine Team phone at [phone] for questions and concerns.

## 2017-09-10 NOTE — Progress Notes (Addendum)
PROGRESS NOTE    Samantha [name]  NOB:[id number] DOB: January 26, 1946 DOA: 08/31/2017 PCP: Patient, No Pcp Per   Brief Narrative: Patient is 72 year old female admitted on 08/31/2017 .She was noted to have pain in her right shoulder for 2 weeks and was seen in the emergency department on 07/29/17 and was found to have a lytic lesion in her right humerus as well as lower extremity DVT .she was referred for workup and was seen again in the emergency department on 08/31/17 with increased shoulder pain, shortness of breath and nausea with vomiting.  Patient was found to have what appears to be a widely metastatic malignancy including right lower lobe lung  mass measuring at least 6 cm with associated mediastinal lymphadenopathy, multifocal skeletal lesions including destructive lesion in the right humerus and several vertebral lesions without pathological fracture and  in addition to a new finding of leptomeningeal enhancement on MRI of the brain suggestive of metastatic disease there.  She also had large right pleural effusion. No focal CNS disease at this time.  Transferred from [facility] to [facility] on 09/02/2017 for possible radiation therapy.She has been started on Xarelto for DVT . Cytology from thoracentesis of 2017-09-27 and CSF both showed malignant cells consistent with metastatic adenocarcinoma from lung. Patient has been started on radiation therapy.  Plan is to start on chemotherapy soon. Repeat thoracocentesis was done on 09/08/17 to send the fluid for cytology.    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 Patient has a right lung mass and right pleural effusion . MRI of the thoracic and lumbar spine with and without contrast pending does not indicate additional foci of leptomeningeal disease, however it does show foci of bony metastasis. Started on  dexamethasone .Radiation oncology following.  Patient underwent CT radiation stimulation on 09/05/2017 prior to radiation therapy for leptomeningeal disease.Plan is to start on chemotherapy following radiotherapy  MRI result previously reviewed with Dr Annette Stable, neurosurgery, does not appears that patient has any urgent neurosurgical needs.  Pleural Fluid sent after repeat thoracentesis on 09/08/17 to repeat the cytology for adequate tissue diagnosis. Palliative care also following  for pain management. On fentanyl/lidocaine  patch,morphine  Right humerus with multiple lytic lesions: Continue current regimen. On radiation therapy. Pain has much improved.  Right upper extremity HUT:MLYYTKP resumed  Headaches with nausea and vomiting:Secondary to leptomeningeal disease in the setting of metastatic lung cancer, improved with Decadron.Currently these symptoms have resolved.  Social/Ethics:Given intracranial findings/mets and need for radiation therapy, patient and patient's son concerned about cognitive deficits, patient would like her son to become her 41,.Pastoral  services and case management consult requested to help facilitate advanced directive and power of attorney documentation/paperwork. D/w Pt's son [name] - [phone].   Awaiting further recommendation from oncology/radiation oncology for discharge planning.  DVT prophylaxis: Xarelto Code Status: Full Family Communication: Family present at the bedside Disposition Plan: Unknown at this time.Will F/U with oncology/radiation oncology   Consultants: Radiation oncology, oncology  Procedures: Radiation therapy  Antimicrobials: None  Subjective: Since seen and examined the  bedside this morning.  Was sleeping.  Patient hardly engages  in communications and just gives short answers. No new issues/events: Denied any pain this morning  Objective: Vitals:   09/09/17 0600 09/09/17 1323 09/09/17 2026 09/10/17 0521  BP: (!) 151/70 (!) 149/72 (!) 141/76 (!) 164/73  Pulse: (!) 57 64 92 65  Resp: 16 16 18 16   Temp: 98.5 F (36.9 C) 98.1 F (36.7 C) 98 F (36.7 C) 97.9 F (36.6 C)  TempSrc: Axillary Axillary Oral Oral  SpO2: 95% 100% 100% 98%  Weight:      Height:       No intake or output data in the 24 hours ending 09/10/17 1304 Filed Weights   09/02/17 2129 09/07/17 0459 09/08/17 0438  Weight: 70.8 kg (156 lb 1.4 oz) 77 kg (169 lb 12.1 oz) 73.5 kg (162 lb 0.6 oz)    Examination:  General exam: Appears calm and comfortable ,Not in distress,average built HEENT:PERRL,Oral mucosa moist, Ear/Nose normal on gross exam Respiratory system: Bilateral equal air entry, normal vesicular breath sounds, no wheezes or crackles  Cardiovascular system: S1 & S2 heard, RRR. No JVD, murmurs, rubs, gallops or clicks. No pedal edema. Gastrointestinal system: Abdomen is nondistended, soft and nontender. No organomegaly or masses felt. Normal bowel sounds heard. Central nervous system: Alert and oriented. No focal neurological deficits. Extremities: No edema, no clubbing ,no cyanosis, distal peripheral pulses palpable. Right shoulder tenderness Skin: No rashes, lesions or ulcers,no icterus ,no pallor MSK: Normal muscle bulk,tone ,power Psychiatry: Judgement and insight appear normal. Mood & affect appropriate.      Data Reviewed: I have personally reviewed following labs and imaging studies  CBC: Recent Labs  Lab 09/05/17 0423 09/06/17 0808 09/07/17 0409 09/08/17 0405 09/10/17 0353  WBC 9.1 12.4* 13.7* 12.8* 10.9*  NEUTROABS  --  11.0*  --   --  9.7*  HGB 12.2 12.4 12.3 11.8* 12.3  HCT 37.6 37.9 37.4 35.7* 36.9  MCV 88.7 87.9 89.0 86.9 87.0  PLT 254 276 262 242  211   Basic Metabolic Panel: Recent Labs  Lab 09/06/17 0410  NA 135  K 4.3  CL 104  CO2 24  GLUCOSE 133*  BUN 25*  CREATININE 0.68  CALCIUM 9.3   GFR: Estimated Creatinine Clearance: 60.9 mL/min (by C-G formula based on SCr of 0.68 mg/dL). Liver Function Tests: No results for input(s): AST, ALT, ALKPHOS, BILITOT, PROT, ALBUMIN in the last 168 hours. No results for input(s): LIPASE, AMYLASE in the last 168 hours. No results for input(s): AMMONIA in the last 168 hours. Coagulation Profile: No results for input(s): INR, PROTIME in the last 168 hours. Cardiac Enzymes: No results for input(s): CKTOTAL, CKMB, CKMBINDEX, TROPONINI in the last 168 hours. BNP (last 3 results) No results for input(s): PROBNP in the last 8760 hours. HbA1C: No results for input(s): HGBA1C in the last 72 hours. CBG: No results for input(s): GLUCAP in the last 168 hours. Lipid Profile: No results for input(s): CHOL, HDL, LDLCALC, TRIG, CHOLHDL, LDLDIRECT in the last 72 hours. Thyroid Function Tests: No results for input(s): TSH, T4TOTAL, FREET4, T3FREE, THYROIDAB in the last 72 hours. Anemia Panel: No results for input(s): VITAMINB12, FOLATE, FERRITIN, TIBC, IRON, RETICCTPCT in the last 72 hours. Sepsis Labs: No results for input(s): PROCALCITON, LATICACIDVEN in the last 168 hours.  Recent Results (from the past 240 hour(s))  Acid Fast Smear (AFB)     Status: None   Collection Time: 09/08/2017  9:21 AM  Result Value Ref Range Status   AFB Specimen Processing Concentration  Final  Acid Fast Smear Negative  Final    Comment: (NOTE) Performed At: [facility] [place], [place] [id number] Rush Farmer MD KG:[id number]    Source (AFB) PLEURAL  Final    Comment: RIGHT Performed at [facility], [place] [address], [place], [place] [address]   Culture, body fluid-bottle     Status: None   Collection Time: 09/28/2017  9:21 AM  Result Value Ref Range Status   Specimen  Description PLEURAL RIGHT  Final   Special Requests NONE  Final   Culture   Final    NO GROWTH 5 DAYS Performed at [facility] [address], [place], [place] [address]    Report Status 09/06/2017 FINAL  Final  Gram stain     Status: None   Collection Time: 09/28/2017  9:21 AM  Result Value Ref Range Status   Specimen Description PLEURAL RIGHT  Final   Special Requests NONE  Final   Gram Stain   Final    RARE WBC PRESENT, PREDOMINANTLY MONONUCLEAR NO ORGANISMS SEEN Performed at [facility], [place] [address], [place], [place] [address]    Report Status 2017-09-28 FINAL  Final         Radiology Studies: Dg Chest 1 View  Result Date: 09/08/2017 CLINICAL DATA:  Status post thoracentesis. EXAM: CHEST 1 VIEW COMPARISON:  09/07/2017 FINDINGS: The cardiomediastinal silhouette is unchanged. Lung volumes are low with slight elevation of the right hemidiaphragm. Right pleural effusion has decreased in size following thoracentesis without a sizable amount of residual fluid apparent on this AP radiograph. Masslike opacity is again seen in the basilar right lower lobe, previously evaluated with CT. The left lung is clear. No pneumothorax is identified. A destructive lesion is again noted in the proximal right humerus. IMPRESSION: 1. Decreased size of right pleural effusion following thoracentesis without pneumothorax. 2. Right lower lobe masslike opacity as previously seen. Electronically Signed   By: Logan Bores M.D.   On: 09/08/2017 14:42   US Thoracentesis Asp Pleural Space W/img Guide  Result Date: 09/08/2017 INDICATION: Patient with history of metastatic adenocarcinoma of probable lung primary, recurrent malignant right pleural effusion. Request made for diagnostic and therapeutic right thoracentesis. EXAM: ULTRASOUND GUIDED DIAGNOSTIC AND THERAPEUTIC RIGHT THORACENTESIS MEDICATIONS: None. COMPLICATIONS: None immediate. PROCEDURE: An ultrasound guided thoracentesis was thoroughly  discussed with the patient and questions answered. The benefits, risks, alternatives and complications were also discussed. The patient understands and wishes to proceed with the procedure. Written consent was obtained. Ultrasound was performed to localize and mark an adequate pocket of fluid in the right chest. The area was then prepped and draped in the normal sterile fashion. 1% Lidocaine was used for local anesthesia. Under ultrasound guidance a 6 Fr Safe-T-Centesis catheter was introduced. Thoracentesis was performed. The catheter was removed and a dressing applied. FINDINGS: A total of approximately 200 cc of slightly hazy, yellow fluid was removed. Samples were sent to the laboratory as requested by the clinical team. The right pleural effusion was small by today's ultrasound. IMPRESSION: Successful ultrasound guided diagnostic and therapeutic right thoracentesis yielding 200 cc of pleural fluid. Follow-up chest x-ray revealed no pneumothorax. Read by: Rowe Robert, PA-C Electronically Signed   By: Corrie Mckusick D.O.   On: 09/08/2017 14:58        Scheduled Meds: . amLODipine  5 mg Oral Daily  . dexamethasone  8 mg Intravenous Q8H  . feeding supplement (ENSURE ENLIVE)  237 mL Oral BID BM  .  fentaNYL  12.5 mcg Transdermal Q72H  . lidocaine  1 patch Transdermal Q24H  . mirtazapine  15 mg Oral QHS  . pantoprazole (PROTONIX) IV  40 mg Intravenous Q24H  . polyethylene glycol  17 g Oral BID  . rivaroxaban  20 mg Oral Daily  . senna  2 tablet Oral BID  . sodium chloride flush  3 mL Intravenous Q12H   Continuous Infusions: . sodium chloride    . sodium chloride 75 mL/hr at 09/10/17 0504     LOS: 10 days    Time spent: 25 mins    Casi Westerfeld Jodie Echevaria, MD [facility] Pager [phone]  If 7PM-7AM, please contact night-coverage [email or web address] Password [email or web address] 09/10/2017, 1:04 PM

## 2017-09-11 ENCOUNTER — Ambulatory Visit
Admit: 2017-09-11 | Discharge: 2017-09-11 | Disposition: A | Discharge status: Home / Self Care | Payer: Medicare HMO | Attending: Radiation Oncology | Admitting: Radiation Oncology

## 2017-09-11 ENCOUNTER — Inpatient Hospital Stay (HOSPITAL_COMMUNITY): Payer: Medicare HMO

## 2017-09-11 MED ORDER — DEXAMETHASONE SODIUM PHOSPHATE 4 MG/ML IJ SOLN
4.0000 mg | Freq: Three times a day (TID) | INTRAMUSCULAR | Status: DC
  Administered 2017-09-11 – 2017-09-12 (×3): 4 mg via INTRAVENOUS
  Filled 2017-09-11 (×3): qty 1

## 2017-09-11 NOTE — Clinical Social Work Note (Signed)
Clinical Social Work Assessment  Patient Details  Name: Samantha [name] MRN: [id number] Date of Birth: September 11, 1945  Date of referral:  09/11/17               Reason for consult:  Facility Placement                Permission sought to share information with:  Family Supports Permission granted to share information::  Yes, Verbal Permission Granted  Name::     son Darla Lesches::     Relationship::     Contact Information:     Housing/Transportation Living arrangements for the past 2 months:  Single Family Home Source of Information:  Adult Children Patient Interpreter Needed:  None Criminal Activity/Legal Involvement Pertinent to Current Situation/Hospitalization:  No - Comment as needed Significant Relationships:  Adult Children, Other Family Members, Friend Lives with:  Adult Children Do you feel safe going back to the place where you live?  Yes Need for family participation in patient care:  Yes (Comment)(family assisting in care and decisions)  Care giving concerns: pt resides at home with her son and daughter-in-law. Prior to this illness was largely independent "but declining" per son. Son feels he will struggle to manage pt at home in her current condition. Son shares that family feels torn re: making treatment decisions for pt. States he understands "somewhat but doesn't know totally what we are dealing with." Per record family has decided to pursue treatment of metastatic disease but son notes they are not sure what is best.  States he does want to pursue SNF placement but also shares concern "that she won't be able to do anything there either."   Social Worker assessment / plan:  CSW consulted to assist with SNF placement. Pt has been dx with widely metastatic cancer. Son states family has wavered for several days on whether or not to pursue treatment but are currently deciding they do want to treat. Up until today were planning to have pt come home but pt much less able to  ambulate today during therapy session and son states they would not be able to assist her at home.   CSW and son discussed at length the barriers to rehab placement based on her current condition: questionable prognosis, questions as to whether she will be strong enough to participate in rehab, needing to be able to come to radiation appointments at [facility], needing prior authorization for SNF admission. Son acknowledged understanding and stated that if any barriers come to preclude pt from acceptance to SNF, or if insurance denies SNF request, family would make arrangements to bring pt back home and re-evaluate treatment plan.   Obtained PASSR, completed FL2 and made referrals to area SNFs.  Will follow up with family tomorrow in order to select SNF. Initiated Civil [employer] fast streamer request.   Employment status:  Retired Nurse, adult PT Recommendations:  [place] / Referral to community resources:  [place]  Patient/Family's Response to care:  Son expressed appreciation  Patient/Family's Understanding of and Emotional Response to Diagnosis, Current Treatment, and Prognosis:  Son demonstrates some understanding of pt's current treatment and has questions re: "how the doctors will decide if she can be treated or not." Demonstrates good ability to clearly state family's wishes for pt. Emotionally- admits to feeling overwhelmed by care questions - "this is a lot, I've never been through anything like this before."  Emotional Assessment Appearance:  Appears stated age Attitude/Demeanor/Rapport:  (  drowsy) Affect (typically observed):  Calm Orientation:  Oriented to Self, Oriented to Place Alcohol / Substance use:  Not Applicable Psych involvement (Current and /or in the community):  No (Comment)  Discharge Needs  Concerns to be addressed:  Decision making concerns, Discharge Planning Concerns Readmission within the last 30 days:   No Current discharge risk:  Dependent with Mobility Barriers to Discharge:  Insurance Authorization   Nila Nephew, LCSW 09/11/2017, 4:16 PM  [phone]

## 2017-09-11 NOTE — Progress Notes (Signed)
PROGRESS NOTE    SHERLEY MCKENNEY  ZOX:[id number] DOB: [birth year] DOA: 08/31/2017 PCP: Patient, No Pcp Per   Brief Narrative: Patient is 72 year old female admitted on 08/31/2017 .She was noted to have pain in her right shoulder for 2 weeks and was seen in the emergency department on 07/29/17 and was found to have a lytic lesion in her right humerus as well as lower extremity DVT .she was referred for workup and was seen again in the emergency department on 08/31/17 with increased shoulder pain, shortness of breath and nausea with vomiting.  Patient was found to have what appears to be a widely metastatic malignancy including right lower lobe lung  mass measuring at least 6 cm with associated mediastinal lymphadenopathy, multifocal skeletal lesions including destructive lesion in the right humerus and several vertebral lesions without pathological fracture and  in addition to a new finding of leptomeningeal enhancement on MRI of the brain suggestive of metastatic disease there.  She also had large right pleural effusion. No focal CNS disease at this time.  Transferred from [facility] to [facility] on 09/02/2017 for possible radiation therapy.She has been started on Xarelto for DVT . Cytology from thoracentesis of 09/17/2017 and CSF both showed malignant cells consistent with metastatic adenocarcinoma from lung. Patient has been started on radiation therapy.  Plan is to start on chemotherapy soon. Repeat thoracocentesis was done on 09/08/17 to send the fluid for cytology. Patient has significant decline since last few days.  Patient has been evaluated by physical therapy and  recommended skilled nursing  facility on discharge.    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 Patient has a right lung mass and right pleural effusion . MRI of the thoracic and lumbar spine with and without contrast pending does not indicate additional foci of leptomeningeal disease, however it does show foci of bony metastasis. Started on  dexamethasone .Radiation oncology following.  Patient underwent CT radiation stimulation on 09/05/2017 prior to radiation therapy for leptomeningeal disease.Plan is to start on chemotherapy following radiotherapy MRI result previously reviewed with Dr Annette Stable, neurosurgery, does not appears that patient has any urgent neurosurgical needs.  Pleural Fluid sent after repeat thoracentesis on 09/08/17 to repeat the cytology for adequate tissue diagnosis. Palliative care also following  for pain management. On fentanyl/lidocaine  patch,morphine  Right humerus with multiple lytic lesions: Continue current regimen. On radiation therapy.   Right upper extremity WJX:BJYNWGN resumed  Headaches with nausea and vomiting:Secondary to leptomeningeal disease in the setting of metastatic lung cancer, improved with Decadron.Currently these symptoms have resolved.  Debility/lethargy/ physical decline: Patient had significant decline over the last few days.  Does not participate in communication.  Physical therapy recommended skilled nursing facility now.  Social worker consulted. CT head was done this morning. Report:Mild collecting system dilatation with periventricular white matter hypoattenuation which could represent small vessel chronic ischemic changes or transependymal CSF flow. No other intracranial abnormalities identified.  Social/Ethics:Given intracranial findings/mets and need for radiation therapy, patient and patient's son  concerned about cognitive deficits, patient would like  her son to become her 45,.Pastoral  services and case management consult requested to help facilitate advanced directive and power of attorney documentation/paperwork. D/w Pt's son Mr. Jared Cahn - [phone].   DVT prophylaxis: Xarelto Code Status: Full Family Communication: Son  Disposition Plan: Likely SNF.  Consultants: Radiation oncology, oncology  Procedures: Radiation therapy  Antimicrobials: None  Subjective: Since seen and examined the bedside this morning.  Does not engage in communication.  Morning/groaning. Discussed with oncology and radiation oncology today.  Recommended to follow-up as an outpatient and no need of hospital stay. Social worker has been consulted for skilled nursing facility placement. Given patient's rapid decline, we will discuss with palliative care about goals of care,possibilty of hospice or other options.  Objective: Vitals:   09/10/17 0521 09/10/17 1333 09/10/17 2148 09/11/17 0652  BP: (!) 164/73 140/67 (!) 151/71 127/75  Pulse: 65 60 63 61  Resp: 16 18 18 16   Temp: 97.9 F (36.6 C) 98.2 F (36.8 C) 98.1 F (36.7 C) 97.8 F (36.6 C)  TempSrc: Oral Oral Oral Oral  SpO2: 98% 99% 97% 99%  Weight:    73 kg (160 lb 15 oz)  Height:        Intake/Output Summary (Last 24 hours) at 09/11/2017 1456 Last data filed at 09/10/2017 1745 Gross per 24 hour  Intake 240 ml  Output 900 ml  Net -660 ml   Filed Weights   09/07/17 0459 09/08/17 0438 09/11/17 0652  Weight: 77 kg (169 lb 12.1 oz) 73.5 kg (162 lb 0.6 oz) 73 kg (160 lb 15 oz)    Examination:  General exam: Not in obvious distress but looks uncomfortable  HEENT:PERRL,Oral mucosa moist, Ear/Nose normal on gross exam Respiratory system: Bilateral equal air entry, normal vesicular breath sounds, no wheezes or crackles  Cardiovascular system: S1 & S2 heard, RRR. No JVD, murmurs, rubs, gallops or clicks. No pedal  edema. Gastrointestinal system: Abdomen is nondistended, soft and nontender. No organomegaly or masses felt. Normal bowel sounds heard. Central nervous system: Alert ,No focal neurological deficits. Extremities: No edema, no clubbing ,no cyanosis, distal peripheral pulses palpable. Skin: No rashes, lesions or ulcers,no icterus ,no pallor MSK: Normal muscle bulk,tone ,power Right shoulder tenderness Psychiatry: Looks depressed, does not engage in communication        Data Reviewed: I have personally reviewed following labs and imaging studies  CBC: Recent Labs  Lab 09/05/17 0423 09/06/17 0808 09/07/17 0409 09/08/17 0405 09/10/17 0353  WBC 9.1 12.4* 13.7* 12.8* 10.9*  NEUTROABS  --  11.0*  --   --  9.7*  HGB 12.2 12.4 12.3 11.8* 12.3  HCT 37.6 37.9 37.4 35.7* 36.9  MCV 88.7 87.9 89.0 86.9 87.0  PLT 254 276 262 242 659   Basic Metabolic Panel: Recent Labs  Lab 09/06/17 0410  NA 135  K 4.3  CL 104  CO2 24  GLUCOSE 133*  BUN 25*  CREATININE 0.68  CALCIUM 9.3   GFR: Estimated Creatinine Clearance: 60.7 mL/min (by C-G formula based on SCr of 0.68 mg/dL). Liver Function Tests: No results for input(s): AST, ALT, ALKPHOS, BILITOT, PROT, ALBUMIN in the last 168 hours. No results for input(s): LIPASE, AMYLASE in the last 168 hours. No results for input(s): AMMONIA in the last 168 hours. Coagulation Profile: No results for input(s): INR, PROTIME in the last 168 hours. Cardiac Enzymes: No results for input(s): CKTOTAL, CKMB, CKMBINDEX, TROPONINI in the last 168 hours. BNP (last 3 results) No results for input(s): PROBNP in the  last 8760 hours. HbA1C: No results for input(s): HGBA1C in the last 72 hours. CBG: No results for input(s): GLUCAP in the last 168 hours. Lipid Profile: No results for input(s): CHOL, HDL, LDLCALC, TRIG, CHOLHDL, LDLDIRECT in the last 72 hours. Thyroid Function Tests: No results for input(s): TSH, T4TOTAL, FREET4, T3FREE, THYROIDAB in the last  72 hours. Anemia Panel: No results for input(s): VITAMINB12, FOLATE, FERRITIN, TIBC, IRON, RETICCTPCT in the last 72 hours. Sepsis Labs: No results for input(s): PROCALCITON, LATICACIDVEN in the last 168 hours.  No results found for this or any previous visit (from the past 240 hour(s)).       Radiology Studies: Ct Head Wo Contrast  Result Date: 09/11/2017 CLINICAL DATA:  Altered mental status, question at ethanol/substance abuse, new diagnosis of disseminated metastatic disease, abnormal MR brain demonstrating leptomeningeal enhancement consistent with leptomeningeal spread of tumor EXAM: CT HEAD WITHOUT CONTRAST TECHNIQUE: Contiguous axial images were obtained from the base of the skull through the vertex without intravenous contrast. Sagittal and coronal MPR images reconstructed from axial data set. COMPARISON:  MR brain 09/02/2017 FINDINGS: Brain: Minimally prominent ventricular system for degree of atrophy. Mild asymmetry of lateral ventricles LEFT larger than RIGHT. No midline shift or mass effect. Periventricular white matter hypoattenuation especially LEFT frontal lobe which could be due to small vessel chronic ischemic changes or transependymal CSF flow. No intracranial hemorrhage, discrete mass lesion, or evidence of acute infarction. No definite extra-axial fluid collections are identified. Vascular: Unremarkable Skull: Intact Sinuses/Orbits: Minimal dependent fluid in the sphenoid sinus. Other: N/A IMPRESSION: Mild collecting system dilatation with periventricular white matter hypoattenuation which could represent small vessel chronic ischemic changes or transependymal CSF flow. No other intracranial abnormalities identified. Specifically, no CT evidence of the leptomeningeal tumor spread as noted on prior MR. Electronically Signed   By: Lavonia Dana M.D.   On: 09/11/2017 14:21        Scheduled Meds: . amLODipine  5 mg Oral Daily  . dexamethasone  4 mg Intravenous Q8H  . feeding  supplement (ENSURE ENLIVE)  237 mL Oral BID BM  . fentaNYL  12.5 mcg Transdermal Q72H  . lidocaine  1 patch Transdermal Q24H  . mirtazapine  15 mg Oral QHS  . pantoprazole (PROTONIX) IV  40 mg Intravenous Q24H  . polyethylene glycol  17 g Oral BID  . rivaroxaban  20 mg Oral Daily  . senna  2 tablet Oral BID  . sodium chloride flush  3 mL Intravenous Q12H   Continuous Infusions: . sodium chloride    . sodium chloride 75 mL/hr at 09/11/17 1342     LOS: 11 days    Time spent: 25 mins    Lovey Crupi Jodie Echevaria, MD [facility] Pager [phone]  If 7PM-7AM, please contact night-coverage [email or web address] Password [facility] 09/11/2017, 2:56 PM

## 2017-09-11 NOTE — NC FL2 (Signed)
[place] MEDICAID FL2 LEVEL OF CARE SCREENING TOOL     IDENTIFICATION  Patient Name: Samantha [name] Birthdate: 1946/06/14 Sex: female Admission Date (Current Location): 08/31/2017  [facility] and [place] Number:  Herbalist and Address:  [facility],  [place] [address] [place]      Provider Number: [id number]  Attending Physician Name and Address:  Marene Lenz, MD  Relative Name and Phone Number:       Current Level of Care: Hospital Recommended Level of Care: [place] Prior Approval Number:    Date Approved/Denied:   PASRR Number: [id number]  Discharge Plan: SNF    Current Diagnoses: Patient Active Problem List   Diagnosis Date Noted  . Palliative care by specialist   . Goals of care, counseling/discussion   . Cancer with leptomeningeal spread ([place]) 09/08/2017  . Non-small cell lung cancer (NSCLC) ([place])   . Bone metastases ([place])   . Leptomeningeal metastases ([place])   . Pleural effusion 2017/09/19  . Lytic bone lesions on xray September 19, 2017  . Lung mass 19-Sep-2017  . Intractable nausea and vomiting 08/31/2017  . Community acquired pneumonia 07/12/2013  . Normocytic anemia 07/12/2013    Orientation RESPIRATION BLADDER Height & Weight     Self, Time, Place, Situation  Normal Continent, External catheter Weight: 160 lb 15 oz (73 kg) Height:  5\' 1"  (154.9 cm)  BEHAVIORAL SYMPTOMS/MOOD NEUROLOGICAL BOWEL NUTRITION STATUS      Continent Diet(regular, thin fluids)  AMBULATORY STATUS COMMUNICATION OF NEEDS Skin   Extensive Assist Verbally Normal                       Personal Care Assistance Level of Assistance  Bathing, Feeding, Dressing Bathing Assistance: Limited assistance Feeding assistance: Limited assistance(supervision) Dressing Assistance: Limited assistance     Functional Limitations Info  Sight, Hearing, Speech Sight Info: Adequate Hearing Info: Adequate Speech Info: Adequate    SPECIAL  CARE FACTORS FREQUENCY  PT (By licensed PT), OT (By licensed OT), Speech therapy     PT Frequency: 5x OT Frequency: 5x     Speech Therapy Frequency: 1x      Contractures Contractures Info: Not present    Additional Factors Info  Code Status, Allergies Code Status Info: DNR Allergies Info: nka           Current Medications (09/11/2017):  This is the current hospital active medication list Current Facility-Administered Medications  Medication Dose Route Frequency Provider Last Rate Last Dose  . 0.9 %  sodium chloride infusion  250 mL Intravenous PRN Merton Border, MD      . 0.9 %  sodium chloride infusion   Intravenous Continuous Jodie Echevaria, Amrit, MD 75 mL/hr at 09/11/17 1342    . albuterol (PROVENTIL) (2.5 MG/3ML) 0.083% nebulizer solution 2.5 mg  2.5 mg Nebulization Q2H PRN Merton Border, MD      . amLODipine (NORVASC) tablet 5 mg  5 mg Oral Daily Jodie Echevaria, Amrit, MD   5 mg at 09/11/17 1025  . dexamethasone (DECADRON) injection 4 mg  4 mg Intravenous Q8H Adhikari Bk, Amrit, MD   4 mg at 09/11/17 1343  . feeding supplement (ENSURE ENLIVE) (ENSURE ENLIVE) liquid 237 mL  237 mL Oral BID BM Regalado, Belkys A, MD   237 mL at 09/09/17 1432  . fentaNYL (DURAGESIC - dosed mcg/hr) 12.5 mcg  12.5 mcg Transdermal Q72H Earlie Counts, NP   12.5 mcg at 09/08/17 1833  .  lidocaine (LIDODERM) 5 % 1 patch  1 patch Transdermal Q24H Earlie Counts, NP   1 patch at 09/10/17 1951  . lidocaine (XYLOCAINE) 2 % injection   Infiltration PRN Saverio Danker, PA-C   10 mL at 09-03-2017 0905  . mirtazapine (REMERON SOL-TAB) disintegrating tablet 15 mg  15 mg Oral QHS Earlie Counts, NP   15 mg at 09/10/17 2137  . morphine CONCENTRATE 10 MG/0.5ML oral solution 5 mg  5 mg Oral Q2H PRN Earlie Counts, NP   5 mg at 09/11/17 0520  . ondansetron (ZOFRAN) tablet 4 mg  4 mg Oral Q6H PRN Merton Border, MD       Or  . ondansetron (ZOFRAN) injection 4 mg  4 mg Intravenous Q6H PRN Merton Border, MD      . pantoprazole  (PROTONIX) injection 40 mg  40 mg Intravenous Q24H Earlie Counts, NP   40 mg at 09/10/17 1951  . polyethylene glycol (MIRALAX / GLYCOLAX) packet 17 g  17 g Oral BID Regalado, Belkys A, MD   17 g at 09/11/17 1025  . rivaroxaban (XARELTO) tablet 20 mg  20 mg Oral Daily Jodie Echevaria, Amrit, MD   20 mg at 09/11/17 1025  . senna (SENOKOT) tablet 17.2 mg  2 tablet Oral BID Earlie Counts, NP   17.2 mg at 09/11/17 1025  . sodium chloride flush (NS) 0.9 % injection 3 mL  3 mL Intravenous Q12H Merton Border, MD   3 mL at 09/09/17 2132  . sodium chloride flush (NS) 0.9 % injection 3 mL  3 mL Intravenous PRN Merton Border, MD         Discharge Medications: Please see discharge summary for a list of discharge medications.  Relevant Imaging Results:  Relevant Lab Results:   Additional Information SS# [id number]  Nila Nephew, LCSW

## 2017-09-11 NOTE — Evaluation (Signed)
Clinical/Bedside Swallow Evaluation Patient Details  Name: Samantha [name] MRN: [id number] Date of Birth: [birth year]  Today's Date: 09/11/2017 Time: SLP Start Time (ACUTE ONLY): 1420 SLP Stop Time (ACUTE ONLY): 1440 SLP Time Calculation (min) (ACUTE ONLY): 20 min  Past Medical History:  Past Medical History:  Diagnosis Date  . Pneumonia 07/12/2013   Past Surgical History:  Past Surgical History:  Procedure Laterality Date  . ABDOMINAL HYSTERECTOMY     20 years ago, due to fibroids   . IR THORACENTESIS ASP PLEURAL SPACE W/IMG GUIDE  10-01-2017   HPI:  72 year old female admitted 08/31/17 with nausea/vomiting and decreased po intake. PMH significant for PNA, widely metastatic malignancy. CXR = RLL masslike opacity.    Assessment / Plan / Recommendation Clinical Impression  Pt very lethargic during evaluation, and required ongoing encouragement to participate in exam and po trials. Minimal intake was observed, due to poor participation. No overt s/s aspiration observed on thin liquid, puree or solid trials. RN reports no observed difficulty with po meds given with liquid, however, she indicates minimal po intake. Will continue current (regular) diet and thin liquids to provide more choices for pt. Recommend dietician consult for appropriate supplements. ST to sign off at this time. Please reconsult if needs arise.    SLP Visit Diagnosis: Dysphagia, unspecified (R13.10)    Aspiration Risk  Mild aspiration risk    Diet Recommendation Thin liquid;Regular   Liquid Administration via: Cup;Straw Medication Administration: Whole meds with liquid Supervision: Staff to assist with self feeding;Patient able to self feed Compensations: Minimize environmental distractions;Slow rate;Small sips/bites Postural Changes: Seated upright at 90 degrees    Other  Recommendations Oral Care Recommendations: Oral care BID       Prognosis Prognosis for Safe Diet Advancement: Good      Swallow Study    General Date of Onset: 08/31/17 HPI: 72 year old female admitted 08/31/17 with nausea/vomiting and decreased po intake. PMH significant for widely metastatic malignancy. CXR = RLL masslike opacity.  Type of Study: Bedside Swallow Evaluation Previous Swallow Assessment: no prior ST intervention found Diet Prior to this Study: Regular;Thin liquids Temperature Spikes Noted: No Respiratory Status: Room air History of Recent Intubation: No Behavior/Cognition: Lethargic/Drowsy;Requires cueing Oral Cavity Assessment: Within Functional Limits Oral Care Completed by SLP: No Oral Cavity - Dentition: Adequate natural dentition Self-Feeding Abilities: Total assist Patient Positioning: Upright in bed Baseline Vocal Quality: Low vocal intensity Volitional Cough: Cognitively unable to elicit Volitional Swallow: Unable to elicit    Oral/Motor/Sensory Function Overall Oral Motor/Sensory Function: Within functional limits   Ice Chips Ice chips: Not tested   Thin Liquid Thin Liquid: Within functional limits Presentation: Straw    Nectar Thick Nectar Thick Liquid: Not tested   Honey Thick Honey Thick Liquid: Not tested   Puree Puree: Within functional limits Presentation: Spoon   Solid   GO   Solid: Within functional limits       Samantha [name] [facility], [place] Speech Language Pathologist [phone]  Samantha [name] 09/11/2017,2:45 PM

## 2017-09-11 NOTE — Progress Notes (Signed)
No charge note:   Spoke with patient's son, Samantha [name] by phone.   He is concerned about his ability to care for his Samantha [name] in her current debilitated state in his home. We discussed possible rehab. We further discussed concerns that she may not progress at rehab and this could be beginning of decline.  For now Samantha [name] would like to discharge Samantha [name] to rehab with outpatient Palliative following for continued GOC and assistance with symptom management. If she continues to decline, he would like to transition to Hospice.  Please note recommendation for outpatient Palliative to follow at facility in discharge instructions.   Mariana Kaufman, AGNP-C Palliative Medicine  Please call Palliative Medicine team phone with any questions [phone]. For individual providers please see AMION.

## 2017-09-11 NOTE — Progress Notes (Addendum)
Physical Therapy Treatment Patient Details Name: Samantha [name] MRN: [id number] DOB: 1946/03/30 Today's Date: 09/11/2017    History of Present Illness 72 year old female admitted on 08/31/2017 patient was found to have what appears to be a widely metastatic malignancy including right lower lobe mass measuring at least 6 cm with associated mediastinal lymphadenopathy, multifocal skeletal lesions including destructive lesion in the right humerus and several vertebral lesions without pathological fracture in addition to a new finding of leptomeningeal enhancement on MRI of the brain suggestive of metastatic disease there.     PT Comments    Significant Mobility decline.  Pt amb 400 feet 09/07/17.  Today required + 2 total Assist to get OOB to [facility]  Pt was unable to support self in standing.  Unable to take any steps.  + 2 assist for tranfers.    Follow Up Recommendations  SNF  Equipment Recommendations  None recommended by PT    Recommendations for Other Services       Precautions / Restrictions Precautions Precautions: Fall Restrictions Weight Bearing Restrictions: No    Mobility  Bed Mobility Overal bed mobility: Needs Assistance Bed Mobility: Supine to Sit     Supine to sit: +2 for safety/equipment;+2 for physical assistance;Total assist(pt 15%) Sit to supine: Total assist;+2 for physical assistance;+2 for safety/equipment(pt 5%)   General bed mobility comments: required increased assist   Transfers Overall transfer level: Needs assistance Equipment used: None Transfers: Stand Pivot Transfers   Stand pivot transfers: Max assist;+2 physical assistance;+2 safety/equipment       General transfer comment: pt unable to initiate self rise from elevated bed.  Very slow/groggy/weak.  Assisted from elevated bed to [facility] then from [facility] back to bed.  Pt unable to support self in standing and unable to complete pivot without assist.  Ambulation/Gait             General Gait  Details: unable to attempt due to poor transfer performance   Stairs            Wheelchair Mobility    Modified Rankin (Stroke Patients Only)       Balance                                            Cognition Arousal/Alertness: (sleepy/groggy/slow) Behavior During Therapy: (tiered)                                   General Comments: following commands and aware of current sitiation but slow to respond       Exercises      General Comments        Pertinent Vitals/Pain Faces Pain Scale: Hurts a little bit Pain Location: RUE during activity/adjustment Pain Descriptors / Indicators: Grimacing Pain Intervention(s): Monitored during session    Home Living                      Prior Function            PT Goals (current goals can now be found in the care plan section) Progress towards PT goals: Progressing toward goals    Frequency    Min 3X/week      PT Plan Current plan remains appropriate    Co-evaluation  AM-PAC PT "6 Clicks" Daily Activity  Outcome Measure  Difficulty turning over in bed (including adjusting bedclothes, sheets and blankets)?: None Difficulty moving from lying on back to sitting on the side of the bed? : None Difficulty sitting down on and standing up from a chair with arms (e.g., wheelchair, bedside commode, etc,.)?: A Little Help needed moving to and from a bed to chair (including a wheelchair)?: A Little Help needed walking in hospital room?: A Little Help needed climbing 3-5 steps with a railing? : A Little 6 Click Score: 20    End of Session Equipment Utilized During Treatment: Gait belt Activity Tolerance: Other (comment)(extreme weakness)     PT Visit Diagnosis: Other symptoms and signs involving the nervous system (R29.898);Muscle weakness (generalized) (M62.81)     Time: 6861-6837 PT Time Calculation (min) (ACUTE ONLY): 19 min  Charges:  $Therapeutic  Activity: 8-22 mins                    G Codes:       Rica Koyanagi  PTA [facility]  Acute  Rehab Pager      [phone]

## 2017-09-12 ENCOUNTER — Ambulatory Visit
Admit: 2017-09-12 | Discharge: 2017-09-12 | Disposition: A | Discharge status: Home / Self Care | Payer: Medicare HMO | Attending: Radiation Oncology | Admitting: Radiation Oncology

## 2017-09-12 DIAGNOSIS — Z7189 Other specified counseling: Secondary | ICD-10-CM

## 2017-09-12 MED ORDER — DEXAMETHASONE 4 MG PO TABS
4.0000 mg | ORAL_TABLET | Freq: Three times a day (TID) | ORAL | Status: DC
  Administered 2017-09-12 – 2017-09-15 (×8): 4 mg via ORAL
  Filled 2017-09-12 (×8): qty 1

## 2017-09-12 MED ORDER — DICLOFENAC SODIUM 1 % TD GEL
4.0000 g | Freq: Four times a day (QID) | TRANSDERMAL | Status: DC
Start: 2017-09-12 — End: 2017-09-15
  Administered 2017-09-12 – 2017-09-15 (×10): 4 g via TOPICAL
  Filled 2017-09-12: qty 100

## 2017-09-12 MED ORDER — PANTOPRAZOLE SODIUM 40 MG PO TBEC
40.0000 mg | DELAYED_RELEASE_TABLET | Freq: Every day | ORAL | Status: DC
  Administered 2017-09-12: 40 mg via ORAL
  Filled 2017-09-12: qty 1

## 2017-09-12 NOTE — Plan of Care (Signed)
Goals updated due to functional decline

## 2017-09-12 NOTE — Progress Notes (Signed)
CSW assisting with disposition plan.  Pt referred to SNFs able to offer transportation to [facility] appointments.  Called pt's son this morning to discuss bed offers. Yesterday discussed preference for [facility], which is facility which has offered admission.  Today, unable to leave voicemail on pt's son's phone to confirm facility choice but will continue attempting to reach him. Initiated Ship broker request with [employer] 09/11/17. Auth request was delayed until today as [employer] has pt's name spelling differently than in epic system ("Nismith"). Authorization currently corrected and in progress.   Plan: SNF for rehab with palliative following. Barrier: confirming plan/facility choice with pt's family today and obtaining insurance authorization for admission.     Sharren Bridge, MSW, LCSW Clinical Social Work 09/12/2017 [phone]

## 2017-09-12 NOTE — Progress Notes (Signed)
Occupational Therapy Treatment Patient Details Name: Samantha [name] MRN: [id number] DOB: 03/06/1946 Today's Date: 09/12/2017    History of present illness 72 year old female admitted on 08/31/2017 patient was found to have what appears to be a widely metastatic malignancy including right lower lobe mass measuring at least 6 cm with associated mediastinal lymphadenopathy, multifocal skeletal lesions including destructive lesion in the right humerus and several vertebral lesions without pathological fracture in addition to a new finding of leptomeningeal enhancement on MRI of the brain suggestive of metastatic disease there.    OT comments  Pt presenting with increased fatigue/weakness this session. Pt requiring MaxA+2 for stand pivot to recliner and Max HOH assist for self-feeding. Pt requiring increased time and multimodal cues for responding to questions and completing instructions. Feel SNF recommendation remains appropriate at this time. Will continue to follow acutely to progress Pt's safety and independence with ADLs and mobility.    Follow Up Recommendations  Supervision/Assistance - 24 hour;SNF(vs. if home, HHOT )    Equipment Recommendations  3 in 1 bedside commode          Precautions / Restrictions Precautions Precautions: Fall Restrictions Weight Bearing Restrictions: No       Mobility Bed Mobility Overal bed mobility: Needs Assistance Bed Mobility: Rolling;Sidelying to Sit Rolling: Mod assist;+2 for physical assistance Sidelying to sit: +2 for physical assistance;Max assist       General bed mobility comments: assist for legs off bed and to lift trunk  Transfers Overall transfer level: Needs assistance Equipment used: None Transfers: Stand Pivot Transfers   Stand pivot transfers: Max assist;+2 physical assistance       General transfer comment: pt following minimal commands to assist so much help given for technique including anterior weight shift, lifting  help of 2 from EOB and to make it to chair beside bed    Balance Overall balance assessment: Needs assistance   Sitting balance-Leahy Scale: Poor Sitting balance - Comments: leaning back sitting EOB min A for balance Postural control: Posterior lean   Standing balance-Leahy Scale: Zero Standing balance comment: did not stand during pivot to chair                           ADL either performed or assessed with clinical judgement   ADL Overall ADL's : Needs assistance/impaired Eating/Feeding: Maximal assistance;Sitting Eating/Feeding Details (indicate cue type and reason): Max HOH assist provided to eat/drink this session; Pt requiring encouragement and cues to complete              Upper Body Dressing : Maximal assistance;Sitting Upper Body Dressing Details (indicate cue type and reason): to doff/don new gown                  Functional mobility during ADLs: Maximal assistance;+2 for physical assistance;+2 for safety/equipment General ADL Comments: Pt required MaxA+2 for bed mobility and squat/stand pivot to recliner; required MaxA to complete self-feeding; Pt moving slowly and not very responsive during session                        Cognition Arousal/Alertness: Lethargic Behavior During Therapy: Flat affect Overall Cognitive Status: Impaired/Different from baseline Area of Impairment: Attention;Memory;Safety/judgement;Problem solving                   Current Attention Level: Focused Memory: Decreased short-term memory   Safety/Judgement: Decreased awareness of safety;Decreased awareness of deficits   Problem Solving:  Requires verbal cues;Slow processing;Decreased initiation General Comments: slow to respond, requires multimodal cues                           Pertinent Vitals/ Pain       Pain Assessment: Faces Faces Pain Scale: Hurts little more Pain Location: RUE during activity/adjustment Pain Descriptors / Indicators:  Grimacing Pain Intervention(s): Limited activity within patient's tolerance;Repositioned                                                          Frequency  Min 2X/week        Progress Toward Goals  OT Goals(current goals can now be found in the care plan section)  Progress towards OT goals: Goals drowngraded-see care plan  Acute Rehab OT Goals Patient Stated Goal: To return home OT Goal Formulation: With patient Time For Goal Achievement: 09/19/17 Potential to Achieve Goals: [place] Discharge plan remains appropriate    Co-evaluation    PT/OT/SLP Co-Evaluation/Treatment: Yes Reason for Co-Treatment: For patient/therapist safety;Necessary to address cognition/behavior during functional activity;To address functional/ADL transfers PT goals addressed during session: Mobility/safety with mobility;Balance OT goals addressed during session: ADL's and self-care      AM-PAC PT "6 Clicks" Daily Activity     Outcome Measure   Help from another person eating meals?: A Lot Help from another person taking care of personal grooming?: A Lot Help from another person toileting, which includes using toliet, bedpan, or urinal?: Total Help from another person bathing (including washing, rinsing, drying)?: A Lot Help from another person to put on and taking off regular upper body clothing?: A Lot Help from another person to put on and taking off regular lower body clothing?: Total 6 Click Score: 10    End of Session Equipment Utilized During Treatment: Gait belt  OT Visit Diagnosis: Muscle weakness (generalized) (M62.81)   Activity Tolerance Patient limited by fatigue   Patient Left in chair;with call bell/phone within reach;with family/visitor present   Nurse Communication Mobility status        Time: 0177-9390 OT Time Calculation (min): 27 min  Charges: OT General Charges $OT Visit: 1 Visit OT Treatments $Self Care/Home Management : 8-22  mins  Lou Cal, OT Pager [phone] 09/12/2017    [name] 09/12/2017, 4:05 PM

## 2017-09-12 NOTE — Plan of Care (Signed)
OT goals updated to reflect Pt functional decline.

## 2017-09-12 NOTE — Progress Notes (Signed)
PROGRESS NOTE    Samantha [name]  XBD:[id number] DOB: 11-Dec-1945 DOA: 08/31/2017 PCP: Patient, No Pcp Per   Brief Narrative: Patient is 72 year old female admitted on 08/31/2017 .She was noted to have pain in her right shoulder for 2 weeks and was seen in the emergency department on 07/29/17 and was found to have a lytic lesion in her right humerus as well as lower extremity DVT .she was referred for workup and was seen again in the emergency department on 08/31/17 with increased shoulder pain, shortness of breath and nausea with vomiting.  Patient was found to have what appears to be a widely metastatic malignancy including right lower lobe lung  mass measuring at least 6 cm with associated mediastinal lymphadenopathy, multifocal skeletal lesions including destructive lesion in the right humerus and several vertebral lesions without pathological fracture and  in addition to a new finding of leptomeningeal enhancement on MRI of the brain suggestive of metastatic disease there.  She also had large right pleural effusion. No focal CNS disease at this time.  Transferred from [facility] to [facility] on 09/02/2017 for possible radiation therapy.She has been started on Xarelto for DVT . Cytology from thoracentesis of 2017/09/18 and CSF both showed malignant cells consistent with metastatic adenocarcinoma from lung. Patient has been started on radiation therapy.  Plan is to start on chemotherapy soon. Repeat thoracocentesis was done on 09/08/17 to send the fluid for cytology. Patient has significant decline since last few days.  Patient has been evaluated by physical therapy and  recommended skilled nursing  facility on discharge.    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 Patient has a right lung mass and right pleural effusion . MRI of the thoracic and lumbar spine with and without contrast pending does not indicate additional foci of leptomeningeal disease, however it does show foci of bony metastasis. Started on  dexamethasone .Radiation oncology following.  Patient underwent CT radiation stimulation on 09/05/2017 prior to radiation therapy for leptomeningeal disease.Plan is to start on chemotherapy following radiotherapy MRI result previously reviewed with Dr Annette Stable, neurosurgery, does not appears that patient has any urgent neurosurgical needs.  Pleural Fluid sent after repeat thoracentesis on 09/08/17 again shows adenocarcinoma Palliative care also following  for pain management. On fentanyl/lidocaine  patch,morphine  Right humerus with multiple lytic lesions: Continue current regimen. On radiation therapy.   Right upper extremity STM:[id number] resumed  Headaches with nausea and vomiting:Secondary to leptomeningeal disease in the setting of metastatic lung cancer, improved with Decadron.Currently these symptoms have resolved.  Debility/lethargy/ physical decline: Patient had significant decline over the last few days.  Does not participate in communication.  Physical therapy recommended skilled nursing facility now.  Social worker consulted. CT head was done this morning. Report:Mild collecting system dilatation with periventricular white matter hypoattenuation which could represent small vessel chronic ischemic changes or transependymal CSF flow. No other intracranial abnormalities identified.  Social/Ethics:Given intracranial findings/mets and need for radiation therapy, patient and patient's son concerned about cognitive  deficits, patient would like her son to become her  HCPOA,.Pastoral  services and case management consult requested to help facilitate advanced directive and power of attorney documentation/paperwork. D/w Pt's son Mr. Shantel Helwig - [phone].  Plan is to discharge her to skilled nursing facility after the bed is available and palliative care to follow-up over there.  DVT prophylaxis: Xarelto Code Status: Full Family Communication: Son  Disposition Plan: SNF.  Consultants: Radiation oncology, oncology  Procedures: Radiation therapy  Antimicrobials: None  Subjective: Patient seen and examined the bedside this morning.  No new events/issues since yesterday.  Objective: Vitals:   09/11/17 0652 09/11/17 1621 09/11/17 2128 09/12/17 0459  BP: 127/75 126/68 124/69 (!) 144/79  Pulse: 61 62 66 85  Resp: 16 16 18 20   Temp: 97.8 F (36.6 C) (!) 97.3 F (36.3 C) 98.1 F (36.7 C) 97.8 F (36.6 C)  TempSrc: Oral Oral Oral Oral  SpO2: 99% 99% 100% 100%  Weight: 73 kg (160 lb 15 oz)     Height:        Intake/Output Summary (Last 24 hours) at 09/12/2017 1216 Last data filed at 09/12/2017 0757 Gross per 24 hour  Intake 4823.75 ml  Output 1500 ml  Net 3323.75 ml   Filed Weights   09/07/17 0459 09/08/17 0438 09/11/17 0652  Weight: 77 kg (169 lb 12.1 oz) 73.5 kg (162 lb 0.6 oz) 73 kg (160 lb 15 oz)    Examination:  General exam: Appears calm and comfortable ,Not in distress,average built HEENT:PERRL,Oral mucosa moist, Ear/Nose normal on gross exam Respiratory system: Bilateral equal air entry, normal vesicular breath sounds, no wheezes or crackles  Cardiovascular system: S1 & S2 heard, RRR. No JVD, murmurs, rubs, gallops or clicks. Gastrointestinal system: Abdomen is nondistended, soft and nontender. No organomegaly or masses felt. Normal bowel sounds heard. Central nervous system: Alert and oriented. No focal neurological deficits. Extremities: No edema, no clubbing ,no cyanosis,  distal peripheral pulses palpable. Right shoulder tenderness Skin: No rashes, lesions or ulcers,no icterus ,no pallor MSK: Normal muscle bulk,tone ,power Psychiatry: Judgement and insight appear normal. Mood & affect appropriate.       Data Reviewed: I have personally reviewed following labs and imaging studies  CBC: Recent Labs  Lab 09/06/17 0808 09/07/17 0409 09/08/17 0405 09/10/17 0353  WBC 12.4* 13.7* 12.8* 10.9*  NEUTROABS 11.0*  --   --  9.7*  HGB 12.4 12.3 11.8* 12.3  HCT 37.9 37.4 35.7* 36.9  MCV 87.9 89.0 86.9 87.0  PLT 276 262 242 993   Basic Metabolic Panel: Recent Labs  Lab 09/06/17 0410  NA 135  K 4.3  CL 104  CO2 24  GLUCOSE 133*  BUN 25*  CREATININE 0.68  CALCIUM 9.3   GFR: Estimated Creatinine Clearance: 60.7 mL/min (by C-G formula based on SCr of 0.68 mg/dL). Liver Function Tests: No results for input(s): AST, ALT, ALKPHOS, BILITOT, PROT, ALBUMIN in the last 168 hours. No results for input(s): LIPASE, AMYLASE in the last 168 hours. No results for input(s): AMMONIA in the last 168 hours. Coagulation Profile: No results for input(s): INR, PROTIME in the last 168 hours. Cardiac Enzymes: No results for input(s): CKTOTAL, CKMB, CKMBINDEX, TROPONINI in the last 168 hours. BNP (last 3 results) No results for input(s): PROBNP in the last 8760 hours. HbA1C: No results for input(s): HGBA1C in the last 72 hours. CBG: No results for input(s): GLUCAP in the last 168 hours. Lipid Profile: No results for input(s): CHOL, HDL, LDLCALC, TRIG, CHOLHDL, LDLDIRECT in the last 72 hours. Thyroid Function Tests: No  results for input(s): TSH, T4TOTAL, FREET4, T3FREE, THYROIDAB in the last 72 hours. Anemia Panel: No results for input(s): VITAMINB12, FOLATE, FERRITIN, TIBC, IRON, RETICCTPCT in the last 72 hours. Sepsis Labs: No results for input(s): PROCALCITON, LATICACIDVEN in the last 168 hours.  No results found for this or any previous visit (from the past 240  hour(s)).       Radiology Studies: Ct Head Wo Contrast  Result Date: 09/11/2017 CLINICAL DATA:  Altered mental status, question at ethanol/substance abuse, new diagnosis of disseminated metastatic disease, abnormal MR brain demonstrating leptomeningeal enhancement consistent with leptomeningeal spread of tumor EXAM: CT HEAD WITHOUT CONTRAST TECHNIQUE: Contiguous axial images were obtained from the base of the skull through the vertex without intravenous contrast. Sagittal and coronal MPR images reconstructed from axial data set. COMPARISON:  MR brain 09/02/2017 FINDINGS: Brain: Minimally prominent ventricular system for degree of atrophy. Mild asymmetry of lateral ventricles LEFT larger than RIGHT. No midline shift or mass effect. Periventricular white matter hypoattenuation especially LEFT frontal lobe which could be due to small vessel chronic ischemic changes or transependymal CSF flow. No intracranial hemorrhage, discrete mass lesion, or evidence of acute infarction. No definite extra-axial fluid collections are identified. Vascular: Unremarkable Skull: Intact Sinuses/Orbits: Minimal dependent fluid in the sphenoid sinus. Other: N/A IMPRESSION: Mild collecting system dilatation with periventricular white matter hypoattenuation which could represent small vessel chronic ischemic changes or transependymal CSF flow. No other intracranial abnormalities identified. Specifically, no CT evidence of the leptomeningeal tumor spread as noted on prior MR. Electronically Signed   By: Lavonia Dana M.D.   On: 09/11/2017 14:21        Scheduled Meds: . amLODipine  5 mg Oral Daily  . dexamethasone  4 mg Oral TID WC  . feeding supplement (ENSURE ENLIVE)  237 mL Oral BID BM  . fentaNYL  12.5 mcg Transdermal Q72H  . lidocaine  1 patch Transdermal Q24H  . mirtazapine  15 mg Oral QHS  . pantoprazole  40 mg Oral q1800  . polyethylene glycol  17 g Oral BID  . rivaroxaban  20 mg Oral Daily  . senna  2 tablet  Oral BID  . sodium chloride flush  3 mL Intravenous Q12H   Continuous Infusions: . sodium chloride    . sodium chloride 75 mL/hr at 09/11/17 1342     LOS: 12 days    Time spent: 25 mins    Rubee Vega Jodie Echevaria, MD [facility] Pager [phone]  If 7PM-7AM, please contact night-coverage [email or web address] Password [facility] 09/12/2017, 12:16 PM

## 2017-09-12 NOTE — Progress Notes (Signed)
The patient is receiving Protonix by the intravenous route.  Based on criteria approved by the Pharmacy and [place], the medication is being converted to the equivalent oral dose form.  These criteria include: -No active GI bleeding -Able to tolerate diet of full liquids (or better) or tube feeding -Able to tolerate other medications by the oral or enteral route  If you have any questions about this conversion, please contact the Pharmacy Department (phone [phone]).  Thank you. Eudelia Bunch, Pharm.D. [phone] 09/12/2017 9:51 AM

## 2017-09-12 NOTE — Progress Notes (Addendum)
Daily Progress Note   Patient Name: Samantha [name]       Date: 09/12/2017 DOB: [birth year]  Age: 72 y.o. MRN#: [id number] Attending Physician: Marene Lenz, MD Primary Care Physician: Patient, No Pcp Per Admit Date: 08/31/2017  Reason for Consultation/Follow-up: Establishing goals of care and Pain control  Subjective: Observed PT session. Patient able to minimally participate. Having great pain after being returned to bed. Family at bedside notes that pain is better when patient is at rest- worsens with movement.  Patient does not answer questions. Does not interact with provider.  Discussed with family my concerns that patient is dying. She is not eating, not communicating. Sleeping more than she is awake. These are signs of end of life processes. Family at bedside Deneise Lever, and David's mother in law) notes they are concerned about this too.  Discussed this with patient's son this morning and attempted to call him this afternoon, but VM was not set up. Shanon Brow is struggling with [place] for patient. Gave him support.  For now the plan continues to be to d/c to facility with outpatient Palliative to follow for symptom management and continued GOC.   Review of Systems  Unable to perform ROS: Mental acuity    Length of Stay: 12  Current Medications: Scheduled Meds:  . amLODipine  5 mg Oral Daily  . dexamethasone  4 mg Oral TID WC  . diclofenac sodium  4 g Topical QID  . feeding supplement (ENSURE ENLIVE)  237 mL Oral BID BM  . fentaNYL  12.5 mcg Transdermal Q72H  . lidocaine  1 patch Transdermal Q24H  . mirtazapine  15 mg Oral QHS  . pantoprazole  40 mg Oral q1800  . polyethylene glycol  17 g Oral BID  . rivaroxaban  20 mg Oral Daily  . senna  2 tablet Oral BID  . sodium chloride flush   3 mL Intravenous Q12H    Continuous Infusions: . sodium chloride    . sodium chloride 75 mL/hr at 09/11/17 1342    PRN Meds: sodium chloride, albuterol, lidocaine, morphine CONCENTRATE, ondansetron **OR** ondansetron (ZOFRAN) IV, sodium chloride flush  Physical Exam  Cardiovascular: Normal rate.  Pulmonary/Chest: Effort normal.  Neurological:  Lethargic, opens eyes briefly, does not answer questions or interact with providers  Skin: Skin is warm and dry.  Psychiatric:  Flat affect  Nursing note and vitals reviewed.           Vital Signs: BP 120/67 (BP Location: Left Arm)   Pulse 62   Temp 98 F (36.7 C) (Oral)   Resp 18   Ht 5\' 1"  (1.549 m)   Wt 73 kg (160 lb 15 oz)   SpO2 99%   BMI 30.41 kg/m  SpO2: SpO2: 99 % O2 Device: O2 Device: Not Delivered O2 Flow Rate:    Intake/output summary:   Intake/Output Summary (Last 24 hours) at 09/12/2017 1545 Last data filed at 09/12/2017 0757 Gross per 24 hour  Intake 1247.5 ml  Output 1500 ml  Net -252.5 ml   LBM: Last BM Date: 09/11/17 Baseline Weight: Weight: 78.5 kg (173 lb) Most recent weight: Weight: 73 kg (160 lb 15 oz)       Palliative Assessment/Data: PPS: 20%     Patient Active Problem List   Diagnosis Date Noted  . Palliative care by specialist   . Goals of care, counseling/discussion   . Cancer with leptomeningeal spread ([place]) 09/08/2017  . Non-small cell lung cancer (NSCLC) ([place])   . Bone metastases ([place])   . Leptomeningeal metastases ([place])   . Pleural effusion September 03, 2017  . Lytic bone lesions on xray 2017-09-03  . Lung mass 09/03/2017  . Intractable nausea and vomiting 08/31/2017  . Community acquired pneumonia 07/12/2013  . Normocytic anemia 07/12/2013    Palliative Care Assessment & Plan   Patient Profile: 72 y.o. female  with past medical history of pneumonia, R arm pain with findings of lytic lesion on xray in December admitted on 08/31/2017 with nausea, and increasing R arm pain, and  headache. 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 She was started on palliative radiation therapy to brain, spine and arm with plans to start chemotherapy after discharge. Headache, and nausea have improved, but arm pain persists. Palliative medicine consulted for assistance with pain control and GOC.    Assessment/Recommendations/Plan   D/C IV Morphine  Morphine concentrated solution 5mg  q2hr prn breakthrough pain  Start Voltaren gel to R upper arm and neck QID  Heat therapy to R upper arm and neck  Continue other pain medications and interventions as ordered- pain is increased with movement- will trial shoulder sling for support- also recommend premed before any planned movement including transfer to radiation tx or PT   PMT will continue to follow  Goals of Care and Additional Recommendations:  Limitations on Scope of Treatment: Full Scope Treatment  Code Status:  DNR  Prognosis:   Unable to determine  Discharge Planning:  To Be Determined  Care plan was discussed with patient's family  Thank you for allowing the Palliative Medicine Team to assist in the care of this patient.   Time In: 1430 Time Out: 1530 Total Time 60 mins Prolonged Time Billed yes      Greater than 50%  of this time was spent counseling and coordinating care related to the above assessment and plan.  Mariana Kaufman, AGNP-C Palliative Medicine   Please contact Palliative Medicine Team phone at [phone] for questions and concerns.

## 2017-09-12 NOTE — Progress Notes (Signed)
Attempted to reach pt's son Samantha [name] again this afternoon to discuss DC plans. Unable to leave voicemail.  [facility] Medicare requesting additional clinical information for SNF authorization request as chart indicates pt declining. Faxed information requested.  When contact made with son, need to discuss that it may be becoming unlikely pt will qualify or be appropriate for SNF for rehab.   Will follow.  Samantha [name], MSW, LCSW Clinical Social Work 09/12/2017 [phone]

## 2017-09-12 NOTE — Care Management Important Message (Signed)
Important Message  Patient Details  Name: BRIANNIA LABA MRN: [id number] Date of Birth: [birth year]   Medicare Important Message Given:  Yes    Kerin Salen 09/12/2017, 10:43 AMImportant Message  Patient Details  Name: JANIYAH BEERY MRN: [id number] Date of Birth: 09/28/1945   Medicare Important Message Given:  Yes    Kerin Salen 09/12/2017, 10:43 AM

## 2017-09-12 NOTE — Progress Notes (Signed)
Physical Therapy Treatment Patient Details Name: Samantha [name] MRN: [id number] DOB: 1946-03-14 Today's Date: 09/12/2017    History of Present Illness 72 year old female admitted on 08/31/2017 patient was found to have what appears to be a widely metastatic malignancy including right lower lobe mass measuring at least 6 cm with associated mediastinal lymphadenopathy, multifocal skeletal lesions including destructive lesion in the right humerus and several vertebral lesions without pathological fracture in addition to a new finding of leptomeningeal enhancement on MRI of the brain suggestive of metastatic disease there.     PT Comments    Patient with massive functional decline since PT evaluation a week ago.  Currently unable to sit EOB unsupported, increased time to follow commands with multimodal cues and with poor activity tolerance.  Feel she will need SNF level rehab.  PT to follow as tolerated.  Follow Up Recommendations  SNF     Equipment Recommendations  None recommended by PT    Recommendations for Other Services       Precautions / Restrictions Precautions Precautions: Fall    Mobility  Bed Mobility Overal bed mobility: Needs Assistance Bed Mobility: Rolling;Sidelying to Sit Rolling: Mod assist;+2 for physical assistance Sidelying to sit: +2 for physical assistance;Max assist       General bed mobility comments: assist for legs off bed and to lift trunk  Transfers Overall transfer level: Needs assistance Equipment used: None Transfers: Stand Pivot Transfers   Stand pivot transfers: Max assist;+2 physical assistance       General transfer comment: pt following minimal commands to assist so much help given for technique including anterior weight shift, lifting help of 2 from EOB and to make it to chair beside bed  Ambulation/Gait                 Stairs            Wheelchair Mobility    Modified Rankin (Stroke Patients Only)        Balance Overall balance assessment: Needs assistance   Sitting balance-Leahy Scale: Poor Sitting balance - Comments: leaning back sitting EOB min A for balance Postural control: Posterior lean   Standing balance-Leahy Scale: Zero Standing balance comment: did not stand during pivot to chair                            Cognition   Behavior During Therapy: Flat affect Overall Cognitive Status: Impaired/Different from baseline                     Current Attention Level: Focused Memory: Decreased short-term memory   Safety/Judgement: Decreased awareness of safety;Decreased awareness of deficits   Problem Solving: Requires verbal cues;Slow processing;Decreased initiation        Exercises      General Comments        Pertinent Vitals/Pain Pain Assessment: Faces Faces Pain Scale: Hurts little more Pain Location: RUE during activity/adjustment Pain Descriptors / Indicators: Grimacing Pain Intervention(s): Limited activity within patient's tolerance;Repositioned    Home Living                      Prior Function            PT Goals (current goals can now be found in the care plan section) Progress towards PT goals: Goals downgraded-see care plan    Frequency    Min 3X/week      PT Plan Current plan  remains appropriate    Co-evaluation PT/OT/SLP Co-Evaluation/Treatment: Yes Reason for Co-Treatment: For patient/therapist safety;Necessary to address cognition/behavior during functional activity;To address functional/ADL transfers PT goals addressed during session: Mobility/safety with mobility;Balance        AM-PAC PT "6 Clicks" Daily Activity  Outcome Measure  Difficulty turning over in bed (including adjusting bedclothes, sheets and blankets)?: Unable Difficulty moving from lying on back to sitting on the side of the bed? : Unable Difficulty sitting down on and standing up from a chair with arms (e.g., wheelchair, bedside  commode, etc,.)?: Unable Help needed moving to and from a bed to chair (including a wheelchair)?: Total Help needed walking in hospital room?: Total Help needed climbing 3-5 steps with a railing? : Total 6 Click Score: 6    End of Session Equipment Utilized During Treatment: Gait belt Activity Tolerance: Patient limited by fatigue Patient left: in chair;with family/visitor present;Other (comment)(with OT working on self feeding) Nurse Communication: Mobility status PT Visit Diagnosis: Other symptoms and signs involving the nervous system (R29.898);Muscle weakness (generalized) (M62.81)     Time: 2023-3435 PT Time Calculation (min) (ACUTE ONLY): 27 min  Charges:  $Therapeutic Activity: 8-22 mins                    G CodesMagda Kiel, [place] [phone] 09/12/2017    [name] 09/12/2017, 3:17 PM

## 2017-09-13 ENCOUNTER — Ambulatory Visit
Admit: 2017-09-13 | Discharge: 2017-09-13 | Disposition: A | Discharge status: Home / Self Care | Payer: Medicare HMO | Attending: Radiation Oncology | Admitting: Radiation Oncology

## 2017-09-13 DIAGNOSIS — R519 Headache, unspecified: Secondary | ICD-10-CM

## 2017-09-13 DIAGNOSIS — R51 Headache: Secondary | ICD-10-CM

## 2017-09-13 MED ORDER — BACLOFEN 10 MG PO TABS
5.0000 mg | ORAL_TABLET | Freq: Three times a day (TID) | ORAL | Status: DC
  Administered 2017-09-13: 5 mg via ORAL
  Filled 2017-09-13 (×2): qty 1

## 2017-09-13 NOTE — Progress Notes (Signed)
Daily Progress Note   Patient Name: Samantha [name]       Date: 09/13/2017 DOB: 10/02/1945  Age: 72 y.o. MRN#: [id number] Attending Physician: Marene Lenz, MD Primary Care Physician: Patient, No Pcp Per Admit Date: 08/31/2017  Reason for Consultation/Follow-up: Establishing goals of care  Subjective: Pt in bed. A bit more alert this morning. Said "good morning". Did not answer other questions. Per nursing she does tend to talk and interact more if family is not in the room. She did not eat breakfast. PRN pain use is only 3x per day.  Foundation one testing still pending. She has 7 more scheduled radiation treatments. Has had four so far.    ROS  Length of Stay: 13  Current Medications: Scheduled Meds:  . amLODipine  5 mg Oral Daily  . baclofen  5 mg Oral TID  . dexamethasone  4 mg Oral TID WC  . diclofenac sodium  4 g Topical QID  . feeding supplement (ENSURE ENLIVE)  237 mL Oral BID BM  . fentaNYL  12.5 mcg Transdermal Q72H  . lidocaine  1 patch Transdermal Q24H  . pantoprazole  40 mg Oral q1800  . polyethylene glycol  17 g Oral BID  . rivaroxaban  20 mg Oral Daily  . senna  2 tablet Oral BID  . sodium chloride flush  3 mL Intravenous Q12H    Continuous Infusions: . sodium chloride    . sodium chloride 75 mL/hr at 09/12/17 1706    PRN Meds: sodium chloride, albuterol, lidocaine, morphine CONCENTRATE, ondansetron **OR** ondansetron (ZOFRAN) IV, sodium chloride flush  Physical Exam  Constitutional: She appears well-developed and well-nourished.  Musculoskeletal: She exhibits tenderness.  Neurological:  Awake, lethargic, unable to assess orientation  Skin: Skin is warm and dry.  Nursing note and vitals reviewed.           Vital Signs: BP 140/73   Pulse (!) 58    Temp 98.4 F (36.9 C) (Oral)   Resp 16   Ht 5\' 1"  (1.549 m)   Wt 73 kg (160 lb 15 oz)   SpO2 97%   BMI 30.41 kg/m  SpO2: SpO2: 97 % O2 Device: O2 Device: Not Delivered O2 Flow Rate:    Intake/output summary:   Intake/Output Summary (Last 24 hours) at 09/13/2017 1101 Last data  filed at 09/13/2017 0600 Gross per 24 hour  Intake 973.75 ml  Output 600 ml  Net 373.75 ml   LBM: Last BM Date: 09/11/17 Baseline Weight: Weight: 78.5 kg (173 lb) Most recent weight: Weight: 73 kg (160 lb 15 oz)       Palliative Assessment/Data: PPS: 20%     Patient Active Problem List   Diagnosis Date Noted  . Headache 09/13/2017  . Advance care planning   . Palliative care by specialist   . Goals of care, counseling/discussion   . 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 Non-small cell lung cancer (NSCLC) ([place])   . Bone metastases ([place])   . 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 Pleural effusion Sep 28, 2017  . Lytic bone lesions on xray 09/28/2017  . Lung mass 28-Sep-2017  . Intractable nausea and vomiting 08/31/2017  . Community acquired pneumonia 07/12/2013  . Normocytic anemia 07/12/2013    Palliative Care Assessment & Plan   Patient Profile: 72 y.o.femalewith past medical history of pneumonia, R arm pain with findings of lytic lesion on xray in Decemberadmitted on 1/31/2019with nausea, and increasing R arm pain, and headache.Workup revealed R lung mass, R pleural effusion- pathology positive for adenocarcinoma likely NSLC primary origin, R humerus, T-spine, L-spine lytic lesions, leptomeningeal metastasis, LP positive for malignant cells. She was started on palliative radiation therapy to brain, spine and arm with plans to start chemotherapy after discharge. Headache, and nausea have improved, but arm pain persists. Palliative medicine consulted for assistance with pain control and GOC.  Assessment/Recommendations/Plan   Noted baclofen added by Oncology  Will d/c  mirtazapine in case this is causing more sedation- although I suspect this is unlikely as there has not been much change since starting it  Continue Fentanyl 12.71mcg/hr with sublingual morphine for breakthrough- prn use does not indicate a need to increase  No family at bedside- tried to discuss [place] with patient's son Shanon Brow, but his voicemail did not answer- social work is discussing discharge plans- will continue to try and reach- plan remains to d/c with palliative following with possible transition to hospice  Goals of Care and Additional Recommendations:  Limitations on Scope of Treatment: Full Scope Treatment  Code Status:  DNR  Prognosis:   < 3 months D/t pt with 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  Thank you for allowing the Palliative Medicine Team to assist in the care of this patient.   Time In: 1040 Time Out: 10115 Total Time 35 mins Prolonged Time Billed  no       Greater than 50%  of this time was spent counseling and coordinating care related to the above assessment and plan.  Mariana Kaufman, AGNP-C Palliative Medicine   Please contact Palliative Medicine Team phone at [phone] for questions and concerns.

## 2017-09-13 NOTE — Progress Notes (Signed)
Followup GOC meeting planned today at 3pm with patient's children.  Mariana Kaufman, AGNP-C Palliative Medicine  Please call Palliative Medicine team phone with any questions [phone]. For individual providers please see AMION.

## 2017-09-13 NOTE — Progress Notes (Signed)
Palliative progress note addendum:   Had followup [place] meeting with Joseph Art' and Shanon Brow- patient's children. Shanon Brow states patient was more alert last night and feels it was the best night she has had recently. Discussed concerns re: patient not eating, having overall decline in functional status and cognition. There are concerns that even if patient's Foundation One test shows eligibility for EGFR mutation and tx possibility- with such decline in her functional status- it is likely she will not be a candidate unless she has some improvement. Discussed alternatives of home with hospice and residential hospice.  Shanon Brow also requested I call his uncle- Gerald Stabs as well for discussion. Gerald Stabs stated he felt that patient is declining quickly and would want to be at home and cared for by family members rather than in the hospital or in a facility. He is working to discuss this with his brothers and his nieces who are CNA's. Discussed what assistance hospice would provide in the home. Gerald Stabs phone number is [phone] PMT will continue to follow to offer support and assist with [place].    Mariana Kaufman, AGNP-C Palliative Medicine  Please call Palliative Medicine team phone with any questions [phone]. For individual providers please see AMION.  Time in: 1500 Time Out: 1600

## 2017-09-13 NOTE — Progress Notes (Signed)
PROGRESS NOTE    Samantha [name]  JSE:[id number] DOB: 04/23/1946 DOA: 08/31/2017 PCP: Patient, No Pcp Per   Brief Narrative: Patient is 72 year old female admitted on 08/31/2017 .She was noted to have pain in her right shoulder for 2 weeks and was seen in the emergency department on 07/29/17 and was found to have a lytic lesion in her right humerus as well as lower extremity DVT .she was referred for workup and was seen again in the emergency department on 08/31/17 with increased shoulder pain, shortness of breath and nausea with vomiting.  Patient was found to have what appears to be a widely metastatic malignancy including right lower lobe lung  mass measuring at least 6 cm with associated mediastinal lymphadenopathy, multifocal skeletal lesions including destructive lesion in the right humerus and several vertebral lesions without pathological fracture and  in addition to a new finding of leptomeningeal enhancement on MRI of the brain suggestive of metastatic disease there.  She also had large right pleural effusion. No focal CNS disease at this time.  [facility] to [facility] on 09/02/2017 for possible radiation therapy.She has been started on Xarelto for DVT . Cytology from thoracentesis of 2017-09-11 and CSF both showed malignant cells consistent with metastatic adenocarcinoma from lung. Patient has been started on radiation therapy.  Plan is to start on chemotherapy soon. Repeat thoracocentesis was done on 09/08/17 to send the fluid for cytology. Patient has significant decline since last few days.  Patient has been evaluated by physical therapy and  recommended skilled nursing  facility on discharge.    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 Patient has a right lung mass and right pleural effusion . MRI of the thoracic and lumbar spine with and without contrast pending does not indicate additional foci of leptomeningeal disease, however it does show foci of bony metastasis. Started on  dexamethasone .Radiation oncology following.  Patient underwent CT radiation stimulation on 09/05/2017 prior to radiation therapy for leptomeningeal disease.Plan is to start on chemotherapy following radiotherapy MRI result previously reviewed with Dr Annette Stable, neurosurgery, does not appears that patient has any urgent neurosurgical needs.  Pleural Fluid sent after repeat thoracentesis on 09/08/17 again shows adenocarcinoma Palliative care also following  for pain management. On fentanyl/lidocaine  patch,morphine  Right humerus with multiple lytic lesions: Continue current regimen. On radiation therapy.   Right upper extremity WVP:XTGGYIR resumed  Headaches with nausea and vomiting:Secondary to leptomeningeal disease in the setting of metastatic lung cancer, improved with Decadron.Currently these symptoms have resolved.  Debility/lethargy/ physical decline: Patient had significant decline over the last few days.  Does not participate in communication.  Physical therapy recommended skilled nursing facility now.  Social worker consulted. CT head was done this morning. Report:Mild collecting system dilatation with periventricular white matter hypoattenuation which could represent small vessel chronic ischemic changes or transependymal CSF flow. No other intracranial abnormalities identified.  Social/Ethics:Given intracranial findings/mets and need for radiation therapy, patient and  patient's son concerned about cognitive deficits,  patient would like her son to become her 65,.Pastoral  services and case management consult requested to help facilitate advanced directive and power of attorney documentation/paperwork. D/w Pt's son [name] - [phone].  Plan is to discharge her to skilled nursing facility after the bed is available and palliative care to follow-up over there. Social worker assisting on DC to SNF  DVT prophylaxis: Xarelto Code Status: Full Family Communication: None present at the bed side Disposition Plan: SNF.  Consultants: Radiation oncology, oncology  Procedures: Radiation therapy  Antimicrobials: None  Subjective: Patient seen and examined the bedside this morning.  No new events/issues since yesterday. She says she does not have any problems but her answers are always the same.  Objective: Vitals:   09/12/17 2023 09/13/17 0453 09/13/17 1030 09/13/17 1045  BP: 139/71 139/72 140/73 140/73  Pulse: 62 (!) 58 63   Resp: 18 16 16    Temp: 98 F (36.7 C) 98.4 F (36.9 C) (!) 97.5 F (36.4 C)   TempSrc: Oral Oral Axillary   SpO2: 99% 97% 99%   Weight:      Height:        Intake/Output Summary (Last 24 hours) at 09/13/2017 1435 Last data filed at 09/13/2017 0830 Gross per 24 hour  Intake 973.75 ml  Output 1600 ml  Net -626.25 ml   Filed Weights   09/07/17 0459 09/08/17 0438 09/11/17 0652  Weight: 77 kg (169 lb 12.1 oz) 73.5 kg (162 lb 0.6 oz) 73 kg (160 lb 15 oz)    Examination:  General exam: Appears calm and comfortable ,Not in distress,average built HEENT:PERRL,Oral mucosa moist, Ear/Nose normal on gross exam Respiratory system: Bilateral equal air entry, normal vesicular breath sounds, no wheezes or crackles  Cardiovascular system: S1 & S2 heard, RRR. No JVD, murmurs, rubs, gallops or clicks. Gastrointestinal system: Abdomen is nondistended, soft and nontender. No organomegaly or masses felt. Normal bowel sounds  heard. Central nervous system: Alert and oriented. No focal neurological deficits. Extremities: No edema, no clubbing ,no cyanosis, distal peripheral pulses palpable. Right shoulder tenderness Skin: No rashes, lesions or ulcers,no icterus ,no pallor MSK: Normal muscle bulk,tone ,power Psychiatry: Judgement and insight appear normal. Mood & affect appropriate.         Data Reviewed: I have personally reviewed following labs and imaging studies  CBC: Recent Labs  Lab 09/07/17 0409 09/08/17 0405 09/10/17 0353  WBC 13.7* 12.8* 10.9*  NEUTROABS  --   --  9.7*  HGB 12.3 11.8* 12.3  HCT 37.4 35.7* 36.9  MCV 89.0 86.9 87.0  PLT 262 242 338   Basic Metabolic Panel: No results for input(s): NA, K, CL, CO2, GLUCOSE, BUN, CREATININE, CALCIUM, MG, PHOS in the last 168 hours. GFR: Estimated Creatinine Clearance: 60.7 mL/min (by C-G formula based on SCr of 0.68 mg/dL). Liver Function Tests: No results for input(s): AST, ALT, ALKPHOS, BILITOT, PROT, ALBUMIN in the last 168 hours. No results for input(s): LIPASE, AMYLASE in the last 168 hours. No results for input(s): AMMONIA in the last 168 hours. Coagulation Profile: No results for input(s): INR, PROTIME in the last 168 hours. Cardiac Enzymes: No results for input(s): CKTOTAL, CKMB, CKMBINDEX, TROPONINI in the last 168 hours. BNP (last 3 results) No results for input(s): PROBNP in the last 8760 hours. HbA1C: No results for input(s): HGBA1C in the last 72 hours. CBG: No results for input(s): GLUCAP in the last 168 hours. Lipid Profile: No results for input(s): CHOL, HDL, LDLCALC, TRIG, CHOLHDL, LDLDIRECT in the  last 72 hours. Thyroid Function Tests: No results for input(s): TSH, T4TOTAL, FREET4, T3FREE, THYROIDAB in the last 72 hours. Anemia Panel: No results for input(s): VITAMINB12, FOLATE, FERRITIN, TIBC, IRON, RETICCTPCT in the last 72 hours. Sepsis Labs: No results for input(s): PROCALCITON, LATICACIDVEN in the last 168  hours.  No results found for this or any previous visit (from the past 240 hour(s)).       Radiology Studies: No results found.      Scheduled Meds: . amLODipine  5 mg Oral Daily  . baclofen  5 mg Oral TID  . dexamethasone  4 mg Oral TID WC  . diclofenac sodium  4 g Topical QID  . feeding supplement (ENSURE ENLIVE)  237 mL Oral BID BM  . fentaNYL  12.5 mcg Transdermal Q72H  . lidocaine  1 patch Transdermal Q24H  . pantoprazole  40 mg Oral q1800  . polyethylene glycol  17 g Oral BID  . rivaroxaban  20 mg Oral Daily  . senna  2 tablet Oral BID  . sodium chloride flush  3 mL Intravenous Q12H   Continuous Infusions: . sodium chloride    . sodium chloride 75 mL/hr at 09/12/17 1706     LOS: 13 days    Time spent: 25 mins    Bethanie Bloxom Jodie Echevaria, MD [facility] Pager [phone]  If 7PM-7AM, please contact night-coverage [email or web address] Password [facility] 09/13/2017, 2:35 PM

## 2017-09-14 ENCOUNTER — Ambulatory Visit
Admit: 2017-09-14 | Discharge: 2017-09-14 | Disposition: A | Discharge status: Home / Self Care | Payer: Medicare HMO | Attending: Radiation Oncology | Admitting: Radiation Oncology

## 2017-09-14 LAB — BASIC METABOLIC PANEL
ANION GAP: 8 (ref 5–15)
BUN: 15 mg/dL (ref 6–20)
CO2: 19 mmol/L — ABNORMAL LOW (ref 22–32)
Calcium: 7.8 mg/dL — ABNORMAL LOW (ref 8.9–10.3)
Chloride: 109 mmol/L (ref 101–111)
Creatinine, Ser: 0.47 mg/dL (ref 0.44–1.00)
GFR calc Af Amer: >60 mL/min (ref >60)
GLUCOSE: 97 mg/dL (ref 65–99)
POTASSIUM: 4.9 mmol/L (ref 3.5–5.1)
Sodium: 136 mmol/L (ref 135–145)

## 2017-09-14 LAB — URINALYSIS, ROUTINE W REFLEX MICROSCOPIC
Bilirubin Urine: NEGATIVE
Glucose, UA: NEGATIVE mg/dL
Ketones, ur: 5 mg/dL — AB
Leukocytes, UA: NEGATIVE
Nitrite: NEGATIVE
PROTEIN: NEGATIVE mg/dL
Specific Gravity, Urine: 1.013 (ref 1.005–1.030)
pH: 7 (ref 5.0–8.0)

## 2017-09-14 LAB — CBC WITH DIFFERENTIAL/PLATELET
BASOS ABS: 0 10*3/uL (ref 0.0–0.1)
Basophils Relative: 0 %
EOS PCT: 1 %
Eosinophils Absolute: 0.1 10*3/uL (ref 0.0–0.7)
HCT: 43.4 % (ref 36.0–46.0)
Hemoglobin: 14.8 g/dL (ref 12.0–15.0)
LYMPHS PCT: 4 %
Lymphs Abs: 0.5 10*3/uL — ABNORMAL LOW (ref 0.7–4.0)
MCH: 29.7 pg (ref 26.0–34.0)
MCHC: 34.1 g/dL (ref 30.0–36.0)
MCV: 87.1 fL (ref 78.0–100.0)
Monocytes Absolute: 0.6 10*3/uL (ref 0.1–1.0)
Monocytes Relative: 5 %
NEUTROS ABS: 10.8 10*3/uL — AB (ref 1.7–7.7)
Neutrophils Relative %: 90 %
PLATELETS: 131 10*3/uL — AB (ref 150–400)
RBC: 4.98 MIL/uL (ref 3.87–5.11)
RDW: 13.9 % (ref 11.5–15.5)
WBC: 12.1 10*3/uL — AB (ref 4.0–10.5)

## 2017-09-14 NOTE — Progress Notes (Signed)
PROGRESS NOTE    DARNETTA KESSELMAN  IHK:[id number] DOB: 27-Oct-1945 DOA: 08/31/2017 PCP: Patient, No Pcp Per   Brief Narrative: Patient is 72 year old female admitted on 08/31/2017 .She was noted to have pain in her right shoulder for 2 weeks and was seen in the emergency department on 07/29/17 and was found to have a lytic lesion in her right humerus as well as lower extremity DVT .she was referred for workup and was seen again in the emergency department on 08/31/17 with increased shoulder pain, shortness of breath and nausea with vomiting.  Patient was found to have what appears to be a widely metastatic malignancy including right lower lobe lung  mass measuring at least 6 cm with associated mediastinal lymphadenopathy, multifocal skeletal lesions including destructive lesion in the right humerus and several vertebral lesions without pathological fracture and  in addition to a new finding of leptomeningeal enhancement on MRI of the brain suggestive of metastatic disease there.  She also had large right pleural effusion. No focal CNS disease at this time.  Transferred from [facility] on 09/02/2017 for possible radiation therapy.She has been started on Xarelto for DVT . Cytology from thoracentesis of 09/23/2017 and CSF both showed malignant cells consistent with metastatic adenocarcinoma from lung. Patient has been started on radiation therapy.  Plan is to start on chemotherapy soon. Repeat thoracocentesis was done on 09/08/17 to send the fluid for cytology. Patient has significant decline since last few days.  Patient was evaluated by physical therapy and  recommended skilled nursing  facility on discharge but the current plan is likely discharge to home with hospice.   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 Patient has a right lung mass and right pleural effusion . MRI of the thoracic and lumbar spine with and without contrast pending does not indicate additional foci of leptomeningeal disease, however it does show foci of bony metastasis. Started on  dexamethasone .Radiation oncology following.  Patient underwent CT radiation stimulation on 09/05/2017 prior to radiation therapy for leptomeningeal disease.Plan is to start on chemotherapy following radiotherapy MRI result previously reviewed with Dr Annette Stable, neurosurgery, does not appears that patient has any urgent neurosurgical needs.  Pleural Fluid sent after repeat thoracentesis on 09/08/17 again shows adenocarcinoma Palliative care also following  for pain management. On fentanyl/lidocaine  patch,morphine  Right humerus with multiple lytic lesions: Continue current regimen. On radiation therapy.   Right upper extremity VFI:EPPIRJJ resumed  Headaches with nausea and vomiting:Secondary to leptomeningeal disease in the setting of metastatic lung cancer, improved with Decadron.Currently these symptoms have resolved.  Debility/lethargy/ physical decline: Patient had significant decline over the last few days.  Does not participate in communication.  Physical therapy recommended skilled nursing facility now.  Social worker consulted. CT head was done few days ago Report:Mild collecting system dilatation with periventricular white matter hypoattenuation which could represent small vessel chronic ischemic changes or transependymal CSF flow. No other intracranial abnormalities identified.  Social/Ethics:Given intracranial findings/mets  and need for  radiation therapy, patient and patient's son concerned about cognitive deficits, patient would like her son to become her HCPOA as per the discussion few days ago  Currently Plan is to discharge her to Hospice at home given her rapid decline.  Palliative care following/SW on board  DVT prophylaxis: Xarelto Code Status: Full Family Communication: None present at the bed side Disposition Plan: Home with Hospice vs SNF.  Consultants: Radiation oncology, oncology, palliative care  Procedures: Radiation therapy  Antimicrobials: None  Subjective: Patient seen and examined the bedside this morning.  No new events/issues since yesterday. She says she does not have any problems but her answers are always the same.  Objective: Vitals:   09/13/17 1045 09/13/17 1427 09/13/17 2053 09/14/17 0512  BP: 140/73 137/65 (!) 121/58 135/76  Pulse:  70 66 (!) 114  Resp:  16 16 16   Temp:  98.9 F (37.2 C) 98.2 F (36.8 C) (!) 97.5 F (36.4 C)  TempSrc:  Axillary Oral Oral  SpO2:  92% 98% 100%  Weight:      Height:        Intake/Output Summary (Last 24 hours) at 09/14/2017 1318 Last data filed at 09/14/2017 1110 Gross per 24 hour  Intake 4128 ml  Output 700 ml  Net 3428 ml   Filed Weights   09/07/17 0459 09/08/17 0438 09/11/17 0652  Weight: 77 kg (169 lb 12.1 oz) 73.5 kg (162 lb 0.6 oz) 73 kg (160 lb 15 oz)    Examination:  General exam: Appears calm and comfortable ,Not in distress,average built HEENT:PERRL,Oral mucosa moist, Ear/Nose normal on gross exam Respiratory system: Bilateral equal air entry, normal vesicular breath sounds, no wheezes or crackles  Cardiovascular system: S1 & S2 heard, RRR. No JVD, murmurs, rubs, gallops or clicks. Gastrointestinal system: Abdomen is nondistended, soft and nontender. No organomegaly or masses felt. Normal bowel sounds heard. Central nervous system: Alert and oriented. No focal neurological deficits. Extremities: No edema, no clubbing  ,no cyanosis, distal peripheral pulses palpable. Right shoulder tenderness Skin: No rashes, lesions or ulcers,no icterus ,no pallor MSK: Normal muscle bulk,tone ,power Psychiatry: Judgement and insight appear impaired due to cognitive decline         Data Reviewed: I have personally reviewed following labs and imaging studies  CBC: Recent Labs  Lab 09/08/17 0405 09/10/17 0353 09/14/17 0435  WBC 12.8* 10.9* 12.1*  NEUTROABS  --  9.7* 10.8*  HGB 11.8* 12.3 14.8  HCT 35.7* 36.9 43.4  MCV 86.9 87.0 87.1  PLT 242 204 387*   Basic Metabolic Panel: Recent Labs  Lab 09/14/17 0435  NA 136  K 4.9  CL 109  CO2 19*  GLUCOSE 97  BUN 15  CREATININE 0.47  CALCIUM 7.8*   GFR: Estimated Creatinine Clearance: 60.7 mL/min (by C-G formula based on SCr of 0.47 mg/dL). Liver Function Tests: No results for input(s): AST, ALT, ALKPHOS, BILITOT, PROT, ALBUMIN in the last 168 hours. No results for input(s): LIPASE, AMYLASE in the last 168 hours. No results for input(s): AMMONIA in the last 168 hours. Coagulation Profile: No results for input(s): INR, PROTIME in the last 168 hours. Cardiac Enzymes: No results for input(s): CKTOTAL, CKMB, CKMBINDEX, TROPONINI in the last 168 hours. BNP (last 3 results) No results for input(s): PROBNP in the last 8760 hours. HbA1C: No results for input(s): HGBA1C in the last 72 hours. CBG: No results for input(s): GLUCAP in the last 168 hours. Lipid Profile: No results for input(s): CHOL, HDL, LDLCALC, TRIG, CHOLHDL, LDLDIRECT  in the last 72 hours. Thyroid Function Tests: No results for input(s): TSH, T4TOTAL, FREET4, T3FREE, THYROIDAB in the last 72 hours. Anemia Panel: No results for input(s): VITAMINB12, FOLATE, FERRITIN, TIBC, IRON, RETICCTPCT in the last 72 hours. Sepsis Labs: No results for input(s): PROCALCITON, LATICACIDVEN in the last 168 hours.  No results found for this or any previous visit (from the past 240 hour(s)).        Radiology Studies: No results found.      Scheduled Meds: . amLODipine  5 mg Oral Daily  . dexamethasone  4 mg Oral TID WC  . diclofenac sodium  4 g Topical QID  . feeding supplement (ENSURE ENLIVE)  237 mL Oral BID BM  . fentaNYL  12.5 mcg Transdermal Q72H  . lidocaine  1 patch Transdermal Q24H  . pantoprazole  40 mg Oral q1800  . polyethylene glycol  17 g Oral BID  . rivaroxaban  20 mg Oral Daily  . senna  2 tablet Oral BID  . sodium chloride flush  3 mL Intravenous Q12H   Continuous Infusions: . sodium chloride    . sodium chloride 75 mL/hr at 09/13/17 2041     LOS: 14 days    Time spent: 25 mins    Zhane Donlan Jodie Echevaria, MD [facility] Pager [phone]  If 7PM-7AM, please contact night-coverage [email or web address] Password [email or web address] 09/14/2017, 1:18 PM

## 2017-09-14 NOTE — Progress Notes (Signed)
OT Cancellation Note  Patient Details Name: Samantha [name] MRN: [id number] DOB: 10/29/1945   Cancelled Treatment:    Reason Eval/Treat Not Completed: Other (comment).  Pt with significant functional decline.  Plan is for residential hospice. Will sign off.    Mayola Mcbain 09/14/2017, 2:47 PM  Lesle Chris, OTR/L [phone] 09/14/2017

## 2017-09-14 NOTE — Progress Notes (Signed)
PT Cancellation Note  Patient Details Name: YSELA HETTINGER MRN: [id number] DOB: 1945-09-26   Cancelled Treatment:     pt unable to tolerate with significant decline.  Per chart review.  Plans are for Residential Hospice.    Nathanial Rancher 09/14/2017, 2:05 PM

## 2017-09-14 NOTE — Progress Notes (Signed)
[facility] Liaison: RN visit  Received request from Sharren Bridge, [place] for family interest in [facility]. Chart reviewed and spoke with son, Shanon Brow to acknowledge referral. Unfortunately [facility] not able to offer a room today. Family and CSW are aware the [facility] liaison will follow up with CSW and family tomorrow or sooner if room becomes available. Please do not hesitate to call with questions.   Please call with any hospice related questons.  Thank you for this referral.  Farrel Gordon, RN, [facility] Liaison  [phone]  [facility] liaisons are on AMION

## 2017-09-14 NOTE — Progress Notes (Signed)
Palliative Care Progress Note  Reason for visit: Goals of care and symptom management in light of metastatic disease  I saw and examined Samantha [name].  She was somnolent throughout encounter.  I met today with patient's family, including her son, in conjunction with Alda Lea, NP.  We discussed her current plan for comfort as well as symptom management.    Family reports that she has intermittent pain that improves with PRN medications.    Her son reports wanting to focus on comfort, but also noted waiting for results of most recent testing.  He noted plan is to pursue placement at [facility].  I discussed her case with Dr. Lebron Conners.  We discussed that chance of her having treatable mutation is low.  He did not think that it would be in her best interest to delay transition to hospice in her care as it is a very low chance that she would have mutation that would warrant even consideration of further therapies.   On exam today, no signs of acute pain or distress, but she did become agitated when examined.  She is not waking much and no real PO intake.  Based on acute decline, I believe we are likely in the last couple weeks of life.  Will plan to follow-up again tomorrow.  Total time: 40 minutes Greater than 50%  of this time was spent counseling and coordinating care related to the above assessment and plan.  Micheline Rough, MD [place] Team [phone]

## 2017-09-14 NOTE — Progress Notes (Signed)
Disconnect with drinking via straw or sips this am; noted on NS & DS. Please consider IV meds when in hospital & sq/sl. Holding am POs until discussed with DR.

## 2017-09-14 NOTE — Progress Notes (Signed)
This CM spoke with pt son Shanon Brow at bedside about disposition planning. Son was given information on difference between residential vs home hospice. Per Shanon Brow they are interested in residential hospice, and [facility] is their first choice. CSW alerted of conversation. Son also asked to speak with MD. MD paged to give above information. Marney Doctor RN,BSN,NCM [phone]

## 2017-09-14 NOTE — Progress Notes (Signed)
IP PROGRESS NOTE  Subjective:  Patient's mentation and performance status appeared to be diminishing further.  Family is contemplating transition to hospice care at this point in time.  Objective: Vital signs in last 24 hours: Blood pressure 132/79, pulse 60, temperature 97.9 F (36.6 C), temperature source Axillary, resp. rate 14, height 5\' 1"  (1.549 m), weight 160 lb 15 oz (73 kg), SpO2 96 %.  Intake/Output from previous day: 02/13 0701 - 02/14 0700 In: 4008 [P.O.:240; I.V.:3768] Out: 1700 [Urine:1700]  Physical Exam:  Somnolent with minimal arousal.  HEENT: Anicteric sclera, moist mucous membranes.  PERRLA, EOMI. Lungs: Clear to auscultation bilaterally, no expiratory wheezing Cardiac: S1/S2, regular.  No murmurs/rubs/gallops Abdomen: Soft, nontender, nondistended.  Bowel sounds are normoactive Extremities: No lower extremity edema Neuro: No gross focal neurological deficits.  Significant cognitive deficit as outlined above   Lab Results: Recent Labs    09/14/17 0435  WBC 12.1*  HGB 14.8  HCT 43.4  PLT 131*    BMET Recent Labs    09/14/17 0435  NA 136  K 4.9  CL 109  CO2 19*  GLUCOSE 97  BUN 15  CREATININE 0.47  CALCIUM 7.8*    No results found for: CEA1  Studies/Results: No results found.  Medications: I have reviewed the patient's current medications.  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  No focal CNS disease at this time.  Patient has mild headache that is potentially attributable to the meningeal disease,  but no focal neurological  deficits.  Additional imaging revealed no leptomeningeal or spinal cord disease throughout the cervical, thoracic, or lumbar spine.  Pleural fluid is positive for presence of malignant cells consistent with adenocarcinoma.  Unfortunately, spinal fluid is also positive for presence of adenocarcinoma cells.  Patient has started radiation therapy targeting whole brain, right humerus, and thoracic spine lesions.  Recommendations: -We are still awaiting results of the mutational testing.  Without results, we do not have reasonable options of therapy to proceed with. -If patient's family believes that proceeding with hospice care at this time is not in her best interest, I fully support that decision.  If mutational testing becomes available in time, I will review the findings with the family and let them decide whether attempted treatment is beneficial at that time.    LOS: 14 days   Ardath Sax, MD   09/14/2017, 8:53 PM

## 2017-09-15 ENCOUNTER — Ambulatory Visit
Admit: 2017-09-15 | Discharge: 2017-09-15 | Disposition: A | Discharge status: Home / Self Care | Payer: Medicare HMO | Attending: Radiation Oncology | Admitting: Radiation Oncology

## 2017-09-15 ENCOUNTER — Encounter: Payer: Self-pay | Admitting: Urology

## 2017-09-15 ENCOUNTER — Telehealth: Payer: Self-pay | Admitting: Pharmacist

## 2017-09-15 LAB — MISC LABCORP TEST (SEND OUT): Labcorp test code: 489067

## 2017-09-15 NOTE — Progress Notes (Signed)
Pt has been referred to [facility] for residential hospice care. CSW, CM, and providers have discussed with pt's son Shanon Brow pt's prognosis and son reports he understands electing hospice means they are not pursuing further palliative or curative treatment. States, "I want to to what's best for her, I am overwhelmed and wish we could take her home but we can't take care of her at this level there." Son completed admission paperwork with [facility] liaison this morning. Will arrange [facility] transportation once pt's DC complete.  Report #[phone]  Sharren Bridge, MSW, LCSW Clinical Social Work 09/15/2017 [phone]

## 2017-09-15 NOTE — Discharge Summary (Signed)
 Physician Discharge Summary  Samantha [name] QPY:[id number] DOB: [birth year] DOA: 08/31/2017  PCP: Patient, No Pcp Per  Admit date: 08/31/2017 Discharge date: 09/15/2017  Admitted From:Home Disposition:  Residential hospice  Discharge Condition:Hospice CODE STATUS: Full Diet recommendation:Regular   Brief/Interim Summary:  Patient is 72 year old female admitted on 08/31/2017 .She was noted to have pain in her right shoulder for 2 weeks and was seen in the emergency department on 07/29/17 and was found to have a lytic lesion in her right humerus as well as lower extremity DVT .she was referred for workup and was seen again in the emergency department on 08/31/17 with increased shoulder pain, shortness of breath and nausea with vomiting.  Patient was found to have what appears to be a widely metastatic malignancy including right lower lobe lung  mass measuring at least 6 cm with associated mediastinal lymphadenopathy, multifocal skeletal lesions including destructive lesion in the right humerus and several vertebral lesions without pathological fracture and  in addition to a new finding of leptomeningeal enhancement on MRI of the brain suggestive of metastatic disease there.  She also had large right pleural effusion. No focal CNS disease at this time. Transferred from [facility] to [facility] on 09/02/2017 for possible radiation therapy. Cytology from thoracentesis of 02-06-2019and CSF both showed malignant cells consistent with metastatic adenocarcinoma from lung. Patient has been started on radiation therapy.  Plan was to start on chemotherapy soon. Repeat thoracocentesis was done on 09/08/17 to send the fluid for cytology which also showed adenocarcinoma. Patient has significant decline since last few days. Palliative care consulted .Given her rapid decline and poor performance status ,family have reached to decision to pursue residential hospice care.  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 Patient has a right lung mass and right pleural effusion . MRI of the thoracic and lumbar spine with and without contrast pending does not indicate additional foci of leptomeningeal disease, however it does show foci of bony metastasis. Started on  dexamethasone .Radiation oncology was following.Patient underwent CT radiation stimulation on 09/05/2017 prior toradiation therapy for leptomeningeal disease.MRI result previously reviewed with Dr Annette Stable, neurosurgery, does not appears that patient has any urgent neurosurgical needs.  Pleural Fluid sent after repeat thoracentesis on 09/08/17 again shows adenocarcinoma Palliative care was also following . She was on  fentanyl/lidocaine  patch,morphine. Given her rapid decline and poor performance status ,family have reached to decision to pursue residential hospice care. Oncology will be following the mutation status of the tumor.If its positive and she is decided for initiating chemotherapy ,oncology will follow up with the patient and family. But given her current status,she may not be a candidate for any therapy and hospice might be the only option.  Right humerus with multiple lytic lesions: Was started on radiation therapy here.   Right upper extremity TIW:PYKDXIP resumed here.  Headaches with nausea and vomiting:Secondary to leptomeningeal disease in the setting of metastatic lung cancer, improved with Decadron.Currently these symptoms have resolved.  Debility/lethargy/ physical decline: Patient had significant decline over the last few days.  Does not participate in communication.  CT head was done few days ago. Report:Mild collecting system dilatation with periventricular white matter hypoattenuation which could represent small  vessel chronic ischemic changes or transependymal CSF flow. No other intracranial abnormalities identified.   Discharge Diagnoses:  Active Problems:   Intractable nausea  and vomiting   Pleural effusion   Lytic bone lesions on xray   Lung mass   Leptomeningeal metastases (HCC)   Non-small cell lung cancer (NSCLC) (HCC)   Bone metastases ([place])   Palliative care by specialist   Goals of care, counseling/discussion   Advance care planning   Headache    Discharge Instructions  Discharge Instructions    Diet - low sodium heart healthy   Complete by:  As directed    Increase activity slowly   Complete by:  As directed      Allergies as of 09/15/2017   No Known Allergies     Medication List    TAKE these medications   acetaminophen 500 MG tablet Commonly known as:  TYLENOL Take 500 mg by mouth every 6 (six) hours as needed for moderate pain.   ondansetron 4 MG disintegrating tablet Commonly known as:  ZOFRAN ODT Take 1 tablet (4 mg total) by mouth every 8 (eight) hours as needed for nausea or vomiting.   oxyCODONE-acetaminophen 5-325 MG tablet Commonly known as:  PERCOCET/ROXICET Take 1 tablet by mouth 2 (two) times daily as needed for moderate pain or severe pain.   Rivaroxaban 15 & 20 MG Tbpk Take 1 tablet by mouth 2 (two) times daily. Take as directed on package: Start with one 15mg  tablet by mouth twice a day with food. On Day 22, switch to one 20mg  tablet once a day with food.      Contact information for after-discharge care    Destination    [facility] .   Service:  Skilled Nursing Contact information: [address] [place] [phone]             No Known Allergies  Consultations: Oncology,palliative care  Procedures/Studies: Dg Chest 1 View  Result Date: 09/08/2017 CLINICAL DATA:  Status post thoracentesis. EXAM: CHEST 1 VIEW COMPARISON:  09/07/2017 FINDINGS: The cardiomediastinal  silhouette is unchanged. Lung volumes are low with slight elevation of the right hemidiaphragm. Right pleural effusion has decreased in size following thoracentesis without a sizable amount of residual fluid apparent on this AP radiograph. Masslike opacity is again seen in the basilar right lower lobe, previously evaluated with CT. The left lung is clear. No pneumothorax is identified. A destructive lesion is again noted in the proximal right humerus. IMPRESSION: 1. Decreased size of right pleural effusion following thoracentesis without pneumothorax. 2. Right lower lobe masslike opacity as previously seen. Electronically Signed   By: Logan Bores M.D.   On: 09/08/2017 14:42   Dg Chest 1 View  Result Date: Sep 27, 2017 CLINICAL DATA:  Thoracentesis. EXAM: CHEST 1 VIEW COMPARISON:  CT 08/31/2016. FINDINGS: Mediastinum and hilar structures are normal. Heart size normal. Atelectatic changes right lung base. Small right pleural effusion. No pneumothorax destructive right humeral lesion best identified on CT. IMPRESSION: 1. Persistent atelectasis/mass right lung base. Reference is made to prior CT report 08/31/2016. 2.  Destructive right humeral lesion best identified on prior CT. Electronically Signed   By: Marcello Moores  Register   On: 09/27/2017 09:39   Dg Chest 2 View  Result Date: 09/07/2017 CLINICAL DATA:  Followup right-sided pleural effusion EXAM: CHEST  2 VIEW COMPARISON:  2017-09-27 chest x-ray and chest CT, MRI of the right humerus dated 09/04/2017 FINDINGS: Cardiac shadow is stable. The lungs are well aerated bilaterally. Persistent right lower lobe infiltrate with associated small effusion is noted. The overall appearance is stable from the prior exam. The osseous  changes in the thoracic spine are not well appreciated on this exam. Moth-eaten appearance to the proximal right humerus is noted similar to that seen on prior MRI examination. IMPRESSION: Stable right lower lobe consolidation and associated effusions  similar to that seen on prior exam. Permeative lesion in the proximal humerus consistent with metastatic disease. Electronically Signed   By: Inez Catalina M.D.   On: 09/07/2017 16:32   Ct Head Wo Contrast  Result Date: 09/11/2017 CLINICAL DATA:  Altered mental status, question at ethanol/substance abuse, new diagnosis of disseminated metastatic disease, abnormal MR brain demonstrating leptomeningeal enhancement consistent with leptomeningeal spread of tumor EXAM: CT HEAD WITHOUT CONTRAST TECHNIQUE: Contiguous axial images were obtained from the base of the skull through the vertex without intravenous contrast. Sagittal and coronal MPR images reconstructed from axial data set. COMPARISON:  MR brain 09/02/2017 FINDINGS: Brain: Minimally prominent ventricular system for degree of atrophy. Mild asymmetry of lateral ventricles LEFT larger than RIGHT. No midline shift or mass effect. Periventricular white matter hypoattenuation especially LEFT frontal lobe which could be due to small vessel chronic ischemic changes or transependymal CSF flow. No intracranial hemorrhage, discrete mass lesion, or evidence of acute infarction. No definite extra-axial fluid collections are identified. Vascular: Unremarkable Skull: Intact Sinuses/Orbits: Minimal dependent fluid in the sphenoid sinus. Other: N/A IMPRESSION: Mild collecting system dilatation with periventricular white matter hypoattenuation which could represent small vessel chronic ischemic changes or transependymal CSF flow. No other intracranial abnormalities identified. Specifically, no CT evidence of the leptomeningeal tumor spread as noted on prior MR. Electronically Signed   By: Lavonia Dana M.D.   On: 09/11/2017 14:21   Ct Chest Wo Contrast  Result Date: 14-Sep-2017 CLINICAL DATA:  Status post thoracentesis. Evaluate right lower lobe process. EXAM: CT CHEST WITHOUT CONTRAST TECHNIQUE: Multidetector CT imaging of the chest was performed following the standard  protocol without IV contrast. COMPARISON:  CT chest, abdomen and pelvis 08/31/2017 FINDINGS: Cardiovascular: The heart is normal in size. No pericardial effusion. The aorta is normal in caliber. No atherosclerotic calcifications. No significant coronary artery calcifications. Mediastinum/Nodes: Mediastinal and right hilar lymphadenopathy. Anterior mediastinal node on image number 40 measures 7 mm. 10 mm pretracheal lymph node on image number 43. 9 mm aorticopulmonary window node on image number 43. 13 mm subcarinal lymph node on the right side on image number 57. The esophagus is grossly normal. Lungs/Pleura: Status post right-sided thoracentesis with a small residual pleural effusion. There is a 6 x 5 cm masslike lesion in the right upper lobe worrisome for neoplasm given the adenopathy and the destructive metastatic bone disease. 4 mm nodule noted at the left lung base on image number 86. Do not see any other obvious pulmonary nodules to suggest pulmonary metastatic disease. Upper Abdomen: No obvious hepatic or adrenal gland metastasis. Contrast in the gallbladder related to vicarious excretion. Musculoskeletal: No obvious breast masses, supraclavicular or axillary adenopathy. Enlarged multinodular thyroid goiter is noted. Again demonstrated is lytic metastatic disease involving the thoracic vertebral bodies, a few ribs and possibly the sternum. T7 is the most affected but no definite canal encroachment. MRI thoracic spine may be helpful for further evaluation. IMPRESSION: 1. Status post right thoracentesis. 6 x 5 cm masslike lesion in the right lower lobe worrisome for primary lung neoplasm given the mediastinal and right hilar adenopathy and diffuse osseous metastatic disease. Recommend correlation with recent right pleural fluid. PET-CT may be helpful to assess for safest biopsy route. 2. 4 mm left lower lobe pulmonary nodules but  no other definite pulmonary lesions. 3. No definite upper abdominal metastatic  disease. 4. Multinodular thyroid goiter. 5. No obvious breast masses. Electronically Signed   By: Marijo Sanes M.D.   On:  16:27   Ct Angio Chest Pe W And/or Wo Contrast  Result Date: 08/31/2017 CLINICAL DATA:  Vomiting, chills since this morning. Right-sided body pain. Pulmonary embolism suspected, high pretest probability. EXAM: CT ANGIOGRAPHY CHEST CT ABDOMEN AND PELVIS WITH CONTRAST TECHNIQUE: Multidetector CT imaging of the chest was performed using the standard protocol during bolus administration of intravenous contrast. Multiplanar CT image reconstructions and MIPs were obtained to evaluate the vascular anatomy. Multidetector CT imaging of the abdomen and pelvis was performed using the standard protocol during bolus administration of intravenous contrast. CONTRAST:  144mL ISOVUE-370 IOPAMIDOL (ISOVUE-370) INJECTION 76% COMPARISON:  None. FINDINGS: CTA CHEST FINDINGS Cardiovascular: Some of the most peripheral segmental and subsegmental pulmonary arteries are difficult to definitively characterize due to mild patient breathing motion artifact, however, there is no pulmonary embolism identified within the main, lobar or central segmental pulmonary arteries bilaterally. Heart size is normal. No pericardial effusion. Thoracic aorta is normal in caliber and configuration. No aneurysm or evidence of dissection. Mediastinum/Nodes: Scattered small and mildly prominent lymph nodes within the mediastinum and right hilum. Esophagus appears normal. Trachea and central bronchi are unremarkable. Lungs/Pleura: Right pleural effusion, moderate to large in size, with adjacent compressive atelectasis. Associated edema within the right upper lobe and right lower lobe. Left lung is clear. Musculoskeletal: Destructive changes of the proximal right humerus, incompletely imaged. Additional lytic foci within the T6 and T7 vertebral bodies, consistent with metastases. Probable additional lytic lesion within the medial  aspects of the left tenth rib. Review of the MIP images confirms the above findings. CT ABDOMEN and PELVIS FINDINGS Hepatobiliary: Tiny hypodense foci within the left liver lobe, much too small to definitively characterize, most likely small cysts. Gallbladder appears normal. No bile duct dilatation. Pancreas: Unremarkable. No pancreatic ductal dilatation or surrounding inflammatory changes. Spleen: Normal in size without focal abnormality. Adrenals/Urinary Tract: Adrenal glands are unremarkable. Kidneys are normal, without renal calculi, focal lesion, or hydronephrosis. Bladder is unremarkable. Stomach/Bowel: Bowel is normal in caliber. No bowel wall thickening or evidence of bowel wall inflammation. Appendix is normal. Stomach is unremarkable, partially decompressed. Vascular/Lymphatic: No significant vascular findings are present. No enlarged abdominal or pelvic lymph nodes. Reproductive: Status post hysterectomy. No adnexal masses. Other: No free fluid or abscess collection. No free intraperitoneal air. No soft tissue mass seen. Musculoskeletal: Subtle small lucency within the right inferior pubic ramus, suspicious for additional metastasis. Similar lytic lesion within the upper sacrum, near the midline, again suspicious for metastasis. More convincing metastases are seen within the L4 vertebral body and right iliac bone. Review of the MIP images confirms the above findings. IMPRESSION: 1. Right pleural effusion, moderate to large in size, with adjacent compressive atelectasis. Associated pulmonary edema within the right upper lobe and right lower lobe. Left lung is clear. 2. Heterogeneous low-density areas within the atelectasis at the right lung base, suspicious for neoplastic mass obscured by the surrounding atelectasis, a possible primary carcinoma for the osseous metastases described below. 3. No pulmonary embolism. 4. Destructive changes of the proximal right humerus, almost certainly neoplastic,  metastatic versus primary carcinoma, incompletely imaged. Additional metastases identified within the thoracic spine, lumbar spine and osseous pelvis, as detailed above. 5. Mild lymphadenopathy within the mediastinum and right hilum, most likely reactive, but metastatic lymphadenopathy certainly not excluded given the osseous  metastases described above. 6. No acute intra-abdominal or intrapelvic abnormality. No bowel obstruction or evidence of bowel wall inflammation. No evidence of acute solid organ abnormality. Appendix is normal. These results were called by telephone at the time of interpretation on 08/31/2017 at 8:14 pm to Dr. Blanchie Dessert , who verbally acknowledged these results. Electronically Signed   By: Franki Cabot M.D.   On: 08/31/2017 20:14   Mr Jeri Cos MP Contrast  Result Date: 09/02/2017 CLINICAL DATA:  Neck pain. Possible thoracic neoplasm. Pleural effusion. Lytic bone lesions. Intractable nausea and vomiting. EXAM: MRI HEAD WITHOUT AND WITH CONTRAST MRI CERVICAL SPINE WITHOUT AND WITH CONTRAST TECHNIQUE: Multiplanar, multiecho pulse sequences of the brain and surrounding structures, and cervical spine, to include the craniocervical junction and cervicothoracic junction, were obtained without and with intravenous contrast. CONTRAST:  14mL MULTIHANCE GADOBENATE DIMEGLUMINE 529 MG/ML IV SOLN COMPARISON:  CT chest 08/31/2017. FINDINGS: MRI HEAD FINDINGS Brain: No restricted diffusion, or hemorrhage.  No intra-axial mass. Communicating hydrocephalus, with mild enlargement of the lateral, third, and fourth ventricles. Abnormal FLAIR signal in the CSF over the convexity, RIGHT greater than LEFT, is accompanied by abnormal enhancement in the subarachnoid space, consistent with leptomeningeal spread of tumor. Reference image 43 series 23; similar findings in the posterior fossa are less impressive, for instance on image 11 series 23. There is mild superimposed atrophy. There is mild chronic  microvascular ischemic change in addition to transependymal absorption. No midline shift. No extra-axial fluid. No definite discrete parenchymal metastases although post infusion imaging is mildly motion degraded. Vascular: Flow voids are maintained. Skull and upper cervical spine: Normal marrow signal. Sinuses/Orbits: Slight layering fluid in the sphenoid. Negative orbits. Other: None. MRI CERVICAL SPINE FINDINGS Alignment: Anatomic. Vertebrae: No worrisome osseous lesion. Cord: Normal signal and morphology. No abnormal enhancement of the cord or surrounding meninges. Posterior Fossa, vertebral arteries, paraspinal tissues: Vertebral arteries are patent. LEFT greater than RIGHT thyroid gland enlargement, likely goiter. Consider CT neck with contrast for further characterization. Abnormal enhancement in the posterior fossa, particularly surrounding the LEFT cerebellar tonsil. Disc levels: No disc protrusion or spinal stenosis. 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 This is most prominent over the cerebral convexity on the RIGHT. See discussion above. No definite parenchymal metastases, increased pressure, or midline shift. Unremarkable cervical spine MRI imaging. Electronically Signed   By: Staci Righter M.D.   On: 09/02/2017 14:50   Mr Cervical Spine W Wo Contrast  Result Date: 09/02/2017 CLINICAL DATA:  Neck pain. Possible thoracic neoplasm. Pleural effusion. Lytic bone lesions. Intractable nausea and vomiting. EXAM: MRI HEAD WITHOUT AND WITH CONTRAST MRI CERVICAL SPINE WITHOUT AND WITH CONTRAST TECHNIQUE: Multiplanar, multiecho pulse sequences of the brain and surrounding structures, and cervical spine, to include the craniocervical junction and cervicothoracic junction, were obtained without and with intravenous contrast. CONTRAST:  62mL MULTIHANCE GADOBENATE DIMEGLUMINE 529 MG/ML IV SOLN COMPARISON:  CT chest 08/31/2017. FINDINGS: MRI HEAD  FINDINGS Brain: No restricted diffusion, or hemorrhage.  No intra-axial mass. Communicating hydrocephalus, with mild enlargement of the lateral, third, and fourth ventricles. Abnormal FLAIR signal in the CSF over the convexity, RIGHT greater than LEFT, is accompanied by abnormal enhancement in the subarachnoid space, consistent with leptomeningeal spread of tumor. Reference image 43 series 23; similar findings in the posterior fossa are less impressive, for instance on image 11 series 23. There is mild superimposed atrophy. There is mild chronic microvascular ischemic change in addition to transependymal absorption. No midline shift. No  extra-axial fluid. No definite discrete parenchymal metastases although post infusion imaging is mildly motion degraded. Vascular: Flow voids are maintained. Skull and upper cervical spine: Normal marrow signal. Sinuses/Orbits: Slight layering fluid in the sphenoid. Negative orbits. Other: None. MRI CERVICAL SPINE FINDINGS Alignment: Anatomic. Vertebrae: No worrisome osseous lesion. Cord: Normal signal and morphology. No abnormal enhancement of the cord or surrounding meninges. Posterior Fossa, vertebral arteries, paraspinal tissues: Vertebral arteries are patent. LEFT greater than RIGHT thyroid gland enlargement, likely goiter. Consider CT neck with contrast for further characterization. Abnormal enhancement in the posterior fossa, particularly surrounding the LEFT cerebellar tonsil. Disc levels: No disc protrusion or spinal stenosis. 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 This is most prominent over the cerebral convexity on the RIGHT. See discussion above. No definite parenchymal metastases, increased pressure, or midline shift. Unremarkable cervical spine MRI imaging. Electronically Signed   By: Staci Righter M.D.   On: 09/02/2017 14:50   Mr Thoracic Spine W Wo Contrast  Result Date: 09/04/2017 CLINICAL DATA:   Widely metastatic malignancy, newly diagnosed. EXAM: MRI THORACIC AND LUMBAR SPINE WITHOUT AND WITH CONTRAST TECHNIQUE: Multiplanar and multiecho pulse sequences of the thoracic and lumbar spine were obtained without and with intravenous contrast. CONTRAST:  84mL MULTIHANCE GADOBENATE DIMEGLUMINE 529 MG/ML IV SOLN COMPARISON:  CT chest, abdomen, and pelvis 08/31/2017 FINDINGS: MRI THORACIC SPINE FINDINGS Alignment:  Normal. Vertebrae: Multiple osseous metastases as seen on recent CT, the largest measuring approximately 1.7 cm in the left half of the T6 vertebral body. A 1 cm lesion is present in the T4 vertebral body. Scattered smaller vertebral body and posterior element lesions are noted. No epidural or other extraosseous tumor is identified. Vertebral body heights are preserved without a fracture identified. A posteromedial left tenth rib lesion is also again noted. Cord: Normal signal and morphology. No abnormal intradural enhancement. Paraspinal and other soft tissues: Multinodular thyroid goiter. Right pleural effusion, suspected right lower lobe lung mass, and thoracic lymphadenopathy more fully evaluated on recent chest CT. Disc levels: Mild thoracic spondylosis with only minimal disc bulging in the midthoracic spine. No sizable focal disc herniation, spinal stenosis, or neural foraminal stenosis. MRI LUMBAR SPINE FINDINGS Segmentation:  Standard. Alignment:  Normal. Vertebrae: L4 and L5 vertebral body enhancing metastases measuring approximately 2 cm each. Smaller enhancing lesions in the L1, L2, L4, and S1 vertebral bodies. Bilateral L4-5 facet enhancement is favored to be degenerative. A 5 mm sclerotic lesion is partially visualized in the posterior left ilium. Vertebral body heights are preserved without a fracture identified. No epidural or other extraosseous tumor is identified. Conus medullaris: Extends to the L1-2 level and appears normal. Normal appearance of the cauda equina without nerve root  thickening or enhancement. Paraspinal and other soft tissues: Unremarkable. Disc levels: L1-2 through L3-4: Normal discs. No stenosis. Mild facet arthrosis at L3-4. L4-5: Disc desiccation. Mild disc bulging and mild facet and ligamentum flavum hypertrophy result in mild bilateral lateral recess stenosis and borderline bilateral neural foraminal stenosis. Small left foraminal annular fissure. L5-S1: Disc desiccation. Mild disc bulging with right subarticular annular fissure. Disc approaches the S1 nerve roots in the lateral recesses but does not result in significant stenosis. IMPRESSION: 1. 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 No evidence of epidural or leptomeningeal spinal disease. 2. Mild lower lumbar disc and facet degeneration. Mild lateral recess stenosis at L4-5. Electronically Signed   By: Logan Bores M.D.   On: 09/04/2017 14:55  Mr Lumbar Spine W Wo Contrast  Result Date: 09/04/2017 CLINICAL DATA:  Widely metastatic malignancy, newly diagnosed. EXAM: MRI THORACIC AND LUMBAR SPINE WITHOUT AND WITH CONTRAST TECHNIQUE: Multiplanar and multiecho pulse sequences of the thoracic and lumbar spine were obtained without and with intravenous contrast. CONTRAST:  45mL MULTIHANCE GADOBENATE DIMEGLUMINE 529 MG/ML IV SOLN COMPARISON:  CT chest, abdomen, and pelvis 08/31/2017 FINDINGS: MRI THORACIC SPINE FINDINGS Alignment:  Normal. Vertebrae: Multiple osseous metastases as seen on recent CT, the largest measuring approximately 1.7 cm in the left half of the T6 vertebral body. A 1 cm lesion is present in the T4 vertebral body. Scattered smaller vertebral body and posterior element lesions are noted. No epidural or other extraosseous tumor is identified. Vertebral body heights are preserved without a fracture identified. A posteromedial left tenth rib lesion is also again noted. Cord: Normal signal and morphology. No abnormal intradural enhancement. Paraspinal and  other soft tissues: Multinodular thyroid goiter. Right pleural effusion, suspected right lower lobe lung mass, and thoracic lymphadenopathy more fully evaluated on recent chest CT. Disc levels: Mild thoracic spondylosis with only minimal disc bulging in the midthoracic spine. No sizable focal disc herniation, spinal stenosis, or neural foraminal stenosis. MRI LUMBAR SPINE FINDINGS Segmentation:  Standard. Alignment:  Normal. Vertebrae: L4 and L5 vertebral body enhancing metastases measuring approximately 2 cm each. Smaller enhancing lesions in the L1, L2, L4, and S1 vertebral bodies. Bilateral L4-5 facet enhancement is favored to be degenerative. A 5 mm sclerotic lesion is partially visualized in the posterior left ilium. Vertebral body heights are preserved without a fracture identified. No epidural or other extraosseous tumor is identified. Conus medullaris: Extends to the L1-2 level and appears normal. Normal appearance of the cauda equina without nerve root thickening or enhancement. Paraspinal and other soft tissues: Unremarkable. Disc levels: L1-2 through L3-4: Normal discs. No stenosis. Mild facet arthrosis at L3-4. L4-5: Disc desiccation. Mild disc bulging and mild facet and ligamentum flavum hypertrophy result in mild bilateral lateral recess stenosis and borderline bilateral neural foraminal stenosis. Small left foraminal annular fissure. L5-S1: Disc desiccation. Mild disc bulging with right subarticular annular fissure. Disc approaches the S1 nerve roots in the lateral recesses but does not result in significant stenosis. IMPRESSION: 1. 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 No evidence of epidural or leptomeningeal spinal disease. 2. Mild lower lumbar disc and facet degeneration. Mild lateral recess stenosis at L4-5. Electronically Signed   By: Logan Bores M.D.   On: 09/04/2017 14:55   Mr Humerus Right W Wo Contrast  Result Date: 09/04/2017 CLINICAL  DATA:  Lytic lesion of the humerus. EXAM: MRI OF THE RIGHT HUMERUS WITHOUT AND WITH CONTRAST TECHNIQUE: Multiplanar, multisequence MR imaging of the right humerus was performed before and after the administration of intravenous contrast. CONTRAST:  15 cc MultiHance intravenous contrast. COMPARISON:  CT chest dated 2017-09-11. Right shoulder x-rays dated July 29, 2017. FINDINGS: Bones/Joint/Cartilage There is a large, destructive, enhancing marrow replacing lesion in the proximal humeral metadiaphysis with associated periosteal reaction. The craniocaudal length of involved bone measures approximately 10 cm and extends to the mid humeral shaft. No pathologic fracture. No additional marrow replacing lesion seen. Small glenohumeral joint effusion. Muscles and Tendons Faint muscle edema in the proximal subscapularis and infraspinatus muscles. Small interstitial tear at the infraspinatus myotendinous junction. Soft tissues Mild soft tissue inflammatory changes surrounding the osseous metastasis. Partially visualized small right pleural effusion and mass-like consolidation in the right lower lobe.  IMPRESSION: 1. Large, destructive, enhancing marrow replacing lesion in the proximal humeral metadiaphysis, consistent with osseous metastasis. No pathologic fracture. 2. Partially visualized small right pleural effusion and mass-like consolidation in the right lower lobe, unchanged. Electronically Signed   By: Titus Dubin M.D.   On: 09/04/2017 14:27   Ct Abdomen Pelvis W Contrast  Result Date: 08/31/2017 CLINICAL DATA:  Vomiting, chills since this morning. Right-sided body pain. Pulmonary embolism suspected, high pretest probability. EXAM: CT ANGIOGRAPHY CHEST CT ABDOMEN AND PELVIS WITH CONTRAST TECHNIQUE: Multidetector CT imaging of the chest was performed using the standard protocol during bolus administration of intravenous contrast. Multiplanar CT image reconstructions and MIPs were obtained to evaluate the  vascular anatomy. Multidetector CT imaging of the abdomen and pelvis was performed using the standard protocol during bolus administration of intravenous contrast. CONTRAST:  127mL ISOVUE-370 IOPAMIDOL (ISOVUE-370) INJECTION 76% COMPARISON:  None. FINDINGS: CTA CHEST FINDINGS Cardiovascular: Some of the most peripheral segmental and subsegmental pulmonary arteries are difficult to definitively characterize due to mild patient breathing motion artifact, however, there is no pulmonary embolism identified within the main, lobar or central segmental pulmonary arteries bilaterally. Heart size is normal. No pericardial effusion. Thoracic aorta is normal in caliber and configuration. No aneurysm or evidence of dissection. Mediastinum/Nodes: Scattered small and mildly prominent lymph nodes within the mediastinum and right hilum. Esophagus appears normal. Trachea and central bronchi are unremarkable. Lungs/Pleura: Right pleural effusion, moderate to large in size, with adjacent compressive atelectasis. Associated edema within the right upper lobe and right lower lobe. Left lung is clear. Musculoskeletal: Destructive changes of the proximal right humerus, incompletely imaged. Additional lytic foci within the T6 and T7 vertebral bodies, consistent with metastases. Probable additional lytic lesion within the medial aspects of the left tenth rib. Review of the MIP images confirms the above findings. CT ABDOMEN and PELVIS FINDINGS Hepatobiliary: Tiny hypodense foci within the left liver lobe, much too small to definitively characterize, most likely small cysts. Gallbladder appears normal. No bile duct dilatation. Pancreas: Unremarkable. No pancreatic ductal dilatation or surrounding inflammatory changes. Spleen: Normal in size without focal abnormality. Adrenals/Urinary Tract: Adrenal glands are unremarkable. Kidneys are normal, without renal calculi, focal lesion, or hydronephrosis. Bladder is unremarkable. Stomach/Bowel: Bowel  is normal in caliber. No bowel wall thickening or evidence of bowel wall inflammation. Appendix is normal. Stomach is unremarkable, partially decompressed. Vascular/Lymphatic: No significant vascular findings are present. No enlarged abdominal or pelvic lymph nodes. Reproductive: Status post hysterectomy. No adnexal masses. Other: No free fluid or abscess collection. No free intraperitoneal air. No soft tissue mass seen. Musculoskeletal: Subtle small lucency within the right inferior pubic ramus, suspicious for additional metastasis. Similar lytic lesion within the upper sacrum, near the midline, again suspicious for metastasis. More convincing metastases are seen within the L4 vertebral body and right iliac bone. Review of the MIP images confirms the above findings. IMPRESSION: 1. Right pleural effusion, moderate to large in size, with adjacent compressive atelectasis. Associated pulmonary edema within the right upper lobe and right lower lobe. Left lung is clear. 2. Heterogeneous low-density areas within the atelectasis at the right lung base, suspicious for neoplastic mass obscured by the surrounding atelectasis, a possible primary carcinoma for the osseous metastases described below. 3. No pulmonary embolism. 4. Destructive changes of the proximal right humerus, almost certainly neoplastic, metastatic versus primary carcinoma, incompletely imaged. Additional metastases identified within the thoracic spine, lumbar spine and osseous pelvis, as detailed above. 5. Mild lymphadenopathy within the mediastinum and right hilum, most  likely reactive, but metastatic lymphadenopathy certainly not excluded given the osseous metastases described above. 6. No acute intra-abdominal or intrapelvic abnormality. No bowel obstruction or evidence of bowel wall inflammation. No evidence of acute solid organ abnormality. Appendix is normal. These results were called by telephone at the time of interpretation on 08/31/2017 at 8:14 pm  to Dr. Blanchie Dessert , who verbally acknowledged these results. Electronically Signed   By: Franki Cabot M.D.   On: 08/31/2017 20:14   Ir Thoracentesis Asp Pleural Space W/img Guide  Result Date: 21-Sep-2017 INDICATION: Patient with a right humeral lytic lesion along with a right lung lesion and a right pleural effusion. Request is made for diagnostic and therapeutic thoracentesis. EXAM: ULTRASOUND GUIDED DIAGNOSTIC AND THERAPEUTIC THORACENTESIS MEDICATIONS: 2% lidocaine COMPLICATIONS: None immediate. PROCEDURE: An ultrasound guided thoracentesis was thoroughly discussed with the patient and questions answered. The benefits, risks, alternatives and complications were also discussed. The patient understands and wishes to proceed with the procedure. Written consent was obtained. Ultrasound was performed to localize and mark an adequate pocket of fluid in the right chest. The area was then prepped and draped in the normal sterile fashion. 2% Lidocaine was used for local anesthesia. Under ultrasound guidance a Safe-T-Centesis catheter was introduced. Thoracentesis was performed. The catheter was removed and a dressing applied. FINDINGS: A total of approximately 0.45 L of serous fluid was removed. Samples were sent to the laboratory as requested by the clinical team. IMPRESSION: Successful ultrasound guided right thoracentesis yielding 0.45 L of pleural fluid. Read by: Saverio Danker, PA-C Electronically Signed   By: Sandi Mariscal M.D.   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 Request made for diagnostic and therapeutic right thoracentesis. EXAM: ULTRASOUND GUIDED DIAGNOSTIC AND THERAPEUTIC RIGHT THORACENTESIS MEDICATIONS: None. COMPLICATIONS: None immediate. PROCEDURE: An ultrasound guided thoracentesis was thoroughly discussed  with the patient and questions answered. The benefits, risks, alternatives and complications were also discussed. The patient understands and wishes to proceed with the procedure. Written consent was obtained. Ultrasound was performed to localize and mark an adequate pocket of fluid in the right chest. The area was then prepped and draped in the normal sterile fashion. 1% Lidocaine was used for local anesthesia. Under ultrasound guidance a 6 Fr Safe-T-Centesis catheter was introduced. Thoracentesis was performed. The catheter was removed and a dressing applied. FINDINGS: A total of approximately 200 cc of slightly hazy, yellow fluid was removed. Samples were sent to the laboratory as requested by the clinical team. The right pleural effusion was small by today's ultrasound. IMPRESSION: Successful ultrasound guided diagnostic and therapeutic right thoracentesis yielding 200 cc of pleural fluid. Follow-up chest x-ray revealed no pneumothorax. Read by: Rowe Robert, PA-C Electronically Signed   By: Corrie Mckusick D.O.   On: 09/08/2017 14:58   Dg Fluoro Guide Lumbar Puncture  Result Date: 09/04/2017 CLINICAL DATA:  Leptomeningeal disease, metastasis EXAM: DIAGNOSTIC LUMBAR PUNCTURE UNDER FLUOROSCOPIC GUIDANCE FLUOROSCOPY TIME:  Fluoroscopy Time:  0.22 min Radiation Exposure Index (if provided by the fluoroscopic device): 7.84 mGy Number of Acquired Spot Images: 0 PROCEDURE: Informed consent was obtained from the patient prior to the procedure, including potential complications of headache, allergy, and pain. With the patient prone, the lower back was prepped with Betadine. 1% Lidocaine was used for local anesthesia. Lumbar puncture was performed at the L3-4 level using a 20 gauge needle with  return of clear CSF with an opening pressure of 30 cm water. 10 ml of CSF were obtained for laboratory studies. The patient tolerated the procedure well and there were no apparent complications. IMPRESSION: Successful lumbar  puncture under fluoroscopic guidance as described above. Electronically Signed   By: Rolm Baptise M.D.   On: 09/04/2017 15:29    (Echo, Carotid, EGD, Colonoscopy, ERCP)    Subjective:   Discharge Exam: Vitals:   09/14/17 1530 09/15/17 0411  BP: 132/79 130/63  Pulse: 60 (!) 58  Resp: 14 14  Temp: 97.9 F (36.6 C) 98 F (36.7 C)  SpO2: 96% 99%   Vitals:   09/13/17 2053 09/14/17 0512 09/14/17 1530 09/15/17 0411  BP: (!) 121/58 135/76 132/79 130/63  Pulse: 66 (!) 114 60 (!) 58  Resp: 16 16 14 14   Temp: 98.2 F (36.8 C) (!) 97.5 F (36.4 C) 97.9 F (36.6 C) 98 F (36.7 C)  TempSrc: Oral Oral Axillary Oral  SpO2: 98% 100% 96% 99%  Weight:      Height:        General: Pt is alert, awake, lethargic and weak Cardiovascular: RRR, S1/S2 +, no rubs, no gallops Respiratory: Bilateral decreased air entry on the bases Abdominal: Soft, NT, ND, bowel sounds + Extremities: no edema, no cyanosis    The results of significant diagnostics from this hospitalization (including imaging, microbiology, ancillary and laboratory) are listed below for reference.     Microbiology: No results found for this or any previous visit (from the past 240 hour(s)).   Labs: BNP (last 3 results) No results for input(s): BNP in the last 8760 hours. Basic Metabolic Panel: Recent Labs  Lab 09/14/17 0435  NA 136  K 4.9  CL 109  CO2 19*  GLUCOSE 97  BUN 15  CREATININE 0.47  CALCIUM 7.8*   Liver Function Tests: No results for input(s): AST, ALT, ALKPHOS, BILITOT, PROT, ALBUMIN in the last 168 hours. No results for input(s): LIPASE, AMYLASE in the last 168 hours. No results for input(s): AMMONIA in the last 168 hours. CBC: Recent Labs  Lab 09/10/17 0353 09/14/17 0435  WBC 10.9* 12.1*  NEUTROABS 9.7* 10.8*  HGB 12.3 14.8  HCT 36.9 43.4  MCV 87.0 87.1  PLT 204 131*   Cardiac Enzymes: No results for input(s): CKTOTAL, CKMB, CKMBINDEX, TROPONINI in the last 168 hours. BNP: Invalid  input(s): POCBNP CBG: No results for input(s): GLUCAP in the last 168 hours. D-Dimer No results for input(s): DDIMER in the last 72 hours. Hgb A1c No results for input(s): HGBA1C in the last 72 hours. Lipid Profile No results for input(s): CHOL, HDL, LDLCALC, TRIG, CHOLHDL, LDLDIRECT in the last 72 hours. Thyroid function studies No results for input(s): TSH, T4TOTAL, T3FREE, THYROIDAB in the last 72 hours.  Invalid input(s): FREET3 Anemia work up No results for input(s): VITAMINB12, FOLATE, FERRITIN, TIBC, IRON, RETICCTPCT in the last 72 hours. Urinalysis    Component Value Date/Time   COLORURINE YELLOW 09/14/2017 0454   APPEARANCEUR HAZY (A) 09/14/2017 0454   LABSPEC 1.013 09/14/2017 0454   PHURINE 7.0 09/14/2017 0454   GLUCOSEU NEGATIVE 09/14/2017 0454   HGBUR SMALL (A) 09/14/2017 0454   BILIRUBINUR NEGATIVE 09/14/2017 0454   KETONESUR 5 (A) 09/14/2017 0454   PROTEINUR NEGATIVE 09/14/2017 0454   UROBILINOGEN 4.0 (H) 07/13/2013 0634   NITRITE NEGATIVE 09/14/2017 0454   LEUKOCYTESUR NEGATIVE 09/14/2017 0454   Sepsis Labs Invalid input(s): PROCALCITONIN,  WBC,  LACTICIDVEN Microbiology No results found for this  or any previous visit (from the past 240 hour(s)).   Time coordinating discharge: Over 30 minutes  SIGNED:   Marene Lenz, MD  [facility] 09/15/2017, 10:50 AM Pager [phone]  If 7PM-7AM, please contact night-coverage [email or web address] Password [email or web address]

## 2017-09-15 NOTE — Progress Notes (Signed)
[facility] hospital liaison: RN note  Received request from Sharren Bridge, [place] for family interest in [facility] with request for transfer today. Chart reviewed. Met with son, Shanon Brow to confirm interest and explain services. Family is agreeable to transfer today. CSW is aware. Registration paperwork completed. Dr. Orpah Melter to assume care per family request. Please fax discharge summary to [phone]. RN please call report to [phone]. Please arrange transport for patient to arrive before noon if possible.  Thank you,  Farrel Gordon, RN, [facility] Liaison  [phone]  [facility] liaisons are on [place]

## 2017-09-15 NOTE — Telephone Encounter (Signed)
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  Labs from Epic assessed, [place] for treatment. 08/09/17 QTC 426 msec, safe for treatment initiation.  Current medication list in Epic reviewed, no significant DDIs with tagrisso identified, however, med list may be trimmed down as patient has been referred to hospice.  MD plans to bring the family into the office on 09/18/17 to discuss treatment options.  Urgent prior authorization has been submitted to [facility] [phone]) at 4:45pm on 09/15/17 [id number] Rational for '160mg'$  daily dose provided to insurance representative.  Oral Oncology Clinic will continue to follow for insurance authorization, copayment issues, initial counseling and start date.  Johny Drilling, PharmD, BCPS, BCOP 09/15/2017 4:48 PM Oral Oncology Clinic [phone]

## 2017-09-15 NOTE — Progress Notes (Signed)
[facility] Therapy Treatment Record Phone [phone]   Radiation Therapy was administered to Samantha [name] on: 09/15/2017  9:32 AM and was treatment # 8 out of a planned course of 10 treatments.  Radiation Treatment  1). Beam photons with 6-10 energy  2). Brachytherapy None  3). Stereotactic Radiosurgery None  4). Other Radiation None     Paulla Fore, RT (T)

## 2017-09-18 ENCOUNTER — Ambulatory Visit: Admission: RE | Admit: 2017-09-18 | Payer: Medicare HMO | Source: Ambulatory Visit

## 2017-09-18 ENCOUNTER — Inpatient Hospital Stay: Payer: Medicare HMO | Admitting: Hematology and Oncology

## 2017-09-18 NOTE — Telephone Encounter (Signed)
Oral Oncology Patient Advocate Encounter  Prior Authorization for Samantha [name] has been approved.    Effective dates: 09/15/2017 through 03/15/2018   Samantha [name]. Samantha [name], Samantha [name] Patient [place] [phone] 09/18/2017 2:22 PM

## 2017-09-18 NOTE — Progress Notes (Signed)
80 Called spoke with granddaughter about today's radiation appointment Samantha [name]  is on the way to see Dr. Lebron [name] and will  not have her radiation treatment today.  After her appointment with Dr. Lebron [name] a family member will call the radiation therapy area and let them know if my grandmother will continue her radiation treatment since starting residential hospice after her discharge from the hospital 09-15-17.  Given the number [phone] to call told to have a great day and I wished her grandmother the best. [phone] L1 made aware of the above discussion.

## 2017-09-19 ENCOUNTER — Ambulatory Visit: Payer: Medicare HMO

## 2017-09-20 ENCOUNTER — Encounter: Payer: Medicare HMO | Admitting: Oncology

## 2017-09-20 ENCOUNTER — Ambulatory Visit: Payer: Medicare HMO

## 2017-09-20 NOTE — Telephone Encounter (Signed)
 Oral Oncology Pharmacist Encounter  Received notification from MD that patient's family has decided against treatment. No further needs from [facility] identified at this time. Oral Oncology Clinic will sign off. Please let us know if we can be of assistance in the future.  Johny Drilling, PharmD, BCPS, BCOP  9:24 AM Oral Oncology Clinic [phone]

## 2017-09-28 ENCOUNTER — Encounter (HOSPITAL_COMMUNITY): Payer: Self-pay

## 2017-09-29 NOTE — Plan of Care (Signed)
  Coping: Level of anxiety will decrease 2017/09/05 1048 - Progressing by Williams Che, RN   Elimination: Will not experience complications related to bowel motility 05-Sep-2017 1048 - Progressing by Williams Che, RN   Pain Managment: General experience of comfort will improve September 05, 2017 1048 - Progressing by Williams Che, RN   Safety: Ability to remain free from injury will improve September 05, 2017 1048 - Progressing by Williams Che, RN

## 2017-09-29 NOTE — Procedures (Signed)
Ultrasound-guided diagnostic and therapeutic right thoracentesis performed yielding 0.45 liters of serous colored fluid. No immediate complications. Follow-up chest x-ray pending.       Samantha [name] 9:15 AM 07-Sep-2017

## 2017-09-29 NOTE — Progress Notes (Signed)
PROGRESS NOTE    Samantha [name]  GGY:[id number] DOB: [birth year] DOA: 08/31/2017 PCP: Patient, No Pcp Per   Brief Narrative: Samantha [name]  is a 72 y.o. 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  Patient could not make it to her MRI appointment due to the pain and weakness and had a CT of the chest and abdomen and the emergency room which showed right pleural effusion probably obscuring bronchogenic cancer which is the source of the right shoulder lytic lesion and other lesions noted in the spine and the pelvis.  I was called to admit for pain control and intractable nausea and vomiting.     Assessment & Plan:   Active Problems:   Intractable nausea and vomiting   Pleural effusion   Lytic bone lesions on xray   Lung mass   1-Right side pleural effusion and possible right lung mass;  Underwent thoracentesis 2-01. Discussed case with Dr Alvy Bimler, she recommend pulmonary consult, for patient to establish care and also pleural fluid cytology has low yield, and probably patient will need further tissue sample. Pulmonology consulted.  -Will order MRI brain of brain patient complaining of headaches.   2-Right humerus multiple lytic lesions.  Will get MRI humerus to further evaluate, might need orthopedic evaluation if impending fracture.  Patient also complaint of neck pain, check also MRI cervical spine.   3-Right upper extremity DVT; was on xarelto. No on heparin in case she required further procedure.   4-Uncontrolled right shoulder pain; continue with oxycodone.   5-Nausea vomiting. Per patient resolved.      DVT prophylaxis: heparin  Code Status: full code.  Family Communication: son who was at bedside.  Disposition Plan: to be determine. PT evaluation.   Consultants:   Oncology  CCM     Procedures:   MRI brain;    Antimicrobials:  none   Subjective: She is lying down in bed, in fetal position, appears uncomfortable, in pain .   Objective: Vitals:   September 02, 2017 0409 02-Sep-2017 0902 09-02-2017 0912 09-02-17 1318  BP: (!) 143/73 (!) 142/76 130/74 (!) 134/7  Pulse: 84   86  Resp: 18   18  Temp: 98.7 F (37.1 C)   98.2 F (36.8 C)  TempSrc: Oral   Oral  SpO2: 93%   95%  Weight:      Height:        Intake/Output Summary (Last 24 hours) at 02-Sep-2017 1454 Last data filed at 09-02-17 1300 Gross per 24 hour  Intake 240 ml  Output -  Net 240 ml   Filed Weights   08/31/17 1929  Weight: 78.5 kg (173 lb)    Examination:  General exam: appears in pain  Respiratory system: Clear to auscultation. Respiratory effort normal. Cardiovascular system: S1 & S2 heard, RRR. No JVD, murmurs, rubs, gallops or clicks. No pedal edema. Gastrointestinal system: Abdomen is nondistended, soft and nontender. No organomegaly or masses felt. Normal bowel sounds heard. Central nervous system: Alert and oriented. No focal neurological deficits. Extremities: right arm unable to move it, unclear if related to pain. Others extremities good strenghn.  Skin: No rashes, lesions or ulcers   Data Reviewed: I have personally reviewed following labs and imaging studies  CBC: Recent Labs  Lab 08/31/17 1446 02-Sep-2017 0551  WBC 7.9 6.3  HGB 13.1 12.3  HCT 39.1 37.5  MCV 88.3 88.4  PLT 229  321   Basic Metabolic Panel: Recent Labs  Lab 08/31/17 1446  NA 136  K 4.1  CL 103  CO2 20*  GLUCOSE 115*  BUN 12  CREATININE 0.79  CALCIUM 9.6   GFR: Estimated Creatinine Clearance: 63 mL/min (by C-G formula based on SCr of 0.79 mg/dL). Liver Function Tests: Recent Labs  Lab 08/31/17 1446  AST 20  ALT 14  ALKPHOS 107  BILITOT 0.6  PROT 7.3  ALBUMIN 3.9   Recent Labs  Lab 08/31/17 1446 September 29, 2017 0946  LIPASE 26  --   AMYLASE  --  351*   No results for input(s): AMMONIA in the last 168 hours. Coagulation Profile: No results for  input(s): INR, PROTIME in the last 168 hours. Cardiac Enzymes: No results for input(s): CKTOTAL, CKMB, CKMBINDEX, TROPONINI in the last 168 hours. BNP (last 3 results) No results for input(s): PROBNP in the last 8760 hours. HbA1C: No results for input(s): HGBA1C in the last 72 hours. CBG: No results for input(s): GLUCAP in the last 168 hours. Lipid Profile: No results for input(s): CHOL, HDL, LDLCALC, TRIG, CHOLHDL, LDLDIRECT in the last 72 hours. Thyroid Function Tests: No results for input(s): TSH, T4TOTAL, FREET4, T3FREE, THYROIDAB in the last 72 hours. Anemia Panel: No results for input(s): VITAMINB12, FOLATE, FERRITIN, TIBC, IRON, RETICCTPCT in the last 72 hours. Sepsis Labs: Recent Labs  Lab 08/31/17 1900  LATICACIDVEN 0.76    Recent Results (from the past 240 hour(s))  Gram stain     Status: None   Collection Time: 09-29-2017  9:21 AM  Result Value Ref Range Status   Specimen Description PLEURAL RIGHT  Final   Special Requests NONE  Final   Gram Stain   Final    RARE WBC PRESENT, PREDOMINANTLY MONONUCLEAR NO ORGANISMS SEEN Performed at [facility], [address], [place], [place] [address]    Report Status 09-29-17 FINAL  Final         Radiology Studies: Dg Chest 1 View  Result Date: 29-Sep-2017 CLINICAL DATA:  Thoracentesis. EXAM: CHEST 1 VIEW COMPARISON:  CT 08/31/2016. FINDINGS: Mediastinum and hilar structures are normal. Heart size normal. Atelectatic changes right lung base. Small right pleural effusion. No pneumothorax destructive right humeral lesion best identified on CT. IMPRESSION: 1. Persistent atelectasis/mass right lung base. Reference is made to prior CT report 08/31/2016. 2.  Destructive right humeral lesion best identified on prior CT. Electronically Signed   By: Marcello Moores  Register   On: 2017-09-29 09:39   Ct Angio Chest Pe W And/or Wo Contrast  Result Date: 08/31/2017 CLINICAL DATA:  Vomiting, chills since this morning. Right-sided body  pain. Pulmonary embolism suspected, high pretest probability. EXAM: CT ANGIOGRAPHY CHEST CT ABDOMEN AND PELVIS WITH CONTRAST TECHNIQUE: Multidetector CT imaging of the chest was performed using the standard protocol during bolus administration of intravenous contrast. Multiplanar CT image reconstructions and MIPs were obtained to evaluate the vascular anatomy. Multidetector CT imaging of the abdomen and pelvis was performed using the standard protocol during bolus administration of intravenous contrast. CONTRAST:  142mL ISOVUE-370 IOPAMIDOL (ISOVUE-370) INJECTION 76% COMPARISON:  None. FINDINGS: CTA CHEST FINDINGS Cardiovascular: Some of the most peripheral segmental and subsegmental pulmonary arteries are difficult to definitively characterize due to mild patient breathing motion artifact, however, there is no pulmonary embolism identified within the main, lobar or central segmental pulmonary arteries bilaterally. Heart size is normal. No pericardial effusion. Thoracic aorta is normal in caliber and configuration. No aneurysm or evidence of dissection. Mediastinum/Nodes: Scattered small  and mildly prominent lymph nodes within the mediastinum and right hilum. Esophagus appears normal. Trachea and central bronchi are unremarkable. Lungs/Pleura: Right pleural effusion, moderate to large in size, with adjacent compressive atelectasis. Associated edema within the right upper lobe and right lower lobe. Left lung is clear. Musculoskeletal: Destructive changes of the proximal right humerus, incompletely imaged. Additional lytic foci within the T6 and T7 vertebral bodies, consistent with metastases. Probable additional lytic lesion within the medial aspects of the left tenth rib. Review of the MIP images confirms the above findings. CT ABDOMEN and PELVIS FINDINGS Hepatobiliary: Tiny hypodense foci within the left liver lobe, much too small to definitively characterize, most likely small cysts. Gallbladder appears normal. No  bile duct dilatation. Pancreas: Unremarkable. No pancreatic ductal dilatation or surrounding inflammatory changes. Spleen: Normal in size without focal abnormality. Adrenals/Urinary Tract: Adrenal glands are unremarkable. Kidneys are normal, without renal calculi, focal lesion, or hydronephrosis. Bladder is unremarkable. Stomach/Bowel: Bowel is normal in caliber. No bowel wall thickening or evidence of bowel wall inflammation. Appendix is normal. Stomach is unremarkable, partially decompressed. Vascular/Lymphatic: No significant vascular findings are present. No enlarged abdominal or pelvic lymph nodes. Reproductive: Status post hysterectomy. No adnexal masses. Other: No free fluid or abscess collection. No free intraperitoneal air. No soft tissue mass seen. Musculoskeletal: Subtle small lucency within the right inferior pubic ramus, suspicious for additional metastasis. Similar lytic lesion within the upper sacrum, near the midline, again suspicious for metastasis. More convincing metastases are seen within the L4 vertebral body and right iliac bone. Review of the MIP images confirms the above findings. IMPRESSION: 1. Right pleural effusion, moderate to large in size, with adjacent compressive atelectasis. Associated pulmonary edema within the right upper lobe and right lower lobe. Left lung is clear. 2. Heterogeneous low-density areas within the atelectasis at the right lung base, suspicious for neoplastic mass obscured by the surrounding atelectasis, a possible primary carcinoma for the osseous metastases described below. 3. No pulmonary embolism. 4. Destructive changes of the proximal right humerus, almost certainly neoplastic, metastatic versus primary carcinoma, incompletely imaged. Additional metastases identified within the thoracic spine, lumbar spine and osseous pelvis, as detailed above. 5. Mild lymphadenopathy within the mediastinum and right hilum, most likely reactive, but metastatic lymphadenopathy  certainly not excluded given the osseous metastases described above. 6. No acute intra-abdominal or intrapelvic abnormality. No bowel obstruction or evidence of bowel wall inflammation. No evidence of acute solid organ abnormality. Appendix is normal. These results were called by telephone at the time of interpretation on 08/31/2017 at 8:14 pm to Dr. Blanchie Dessert , who verbally acknowledged these results. Electronically Signed   By: Franki Cabot M.D.   On: 08/31/2017 20:14   Ct Abdomen Pelvis W Contrast  Result Date: 08/31/2017 CLINICAL DATA:  Vomiting, chills since this morning. Right-sided body pain. Pulmonary embolism suspected, high pretest probability. EXAM: CT ANGIOGRAPHY CHEST CT ABDOMEN AND PELVIS WITH CONTRAST TECHNIQUE: Multidetector CT imaging of the chest was performed using the standard protocol during bolus administration of intravenous contrast. Multiplanar CT image reconstructions and MIPs were obtained to evaluate the vascular anatomy. Multidetector CT imaging of the abdomen and pelvis was performed using the standard protocol during bolus administration of intravenous contrast. CONTRAST:  160mL ISOVUE-370 IOPAMIDOL (ISOVUE-370) INJECTION 76% COMPARISON:  None. FINDINGS: CTA CHEST FINDINGS Cardiovascular: Some of the most peripheral segmental and subsegmental pulmonary arteries are difficult to definitively characterize due to mild patient breathing motion artifact, however, there is no pulmonary embolism identified within the main, lobar  or central segmental pulmonary arteries bilaterally. Heart size is normal. No pericardial effusion. Thoracic aorta is normal in caliber and configuration. No aneurysm or evidence of dissection. Mediastinum/Nodes: Scattered small and mildly prominent lymph nodes within the mediastinum and right hilum. Esophagus appears normal. Trachea and central bronchi are unremarkable. Lungs/Pleura: Right pleural effusion, moderate to large in size, with adjacent  compressive atelectasis. Associated edema within the right upper lobe and right lower lobe. Left lung is clear. Musculoskeletal: Destructive changes of the proximal right humerus, incompletely imaged. Additional lytic foci within the T6 and T7 vertebral bodies, consistent with metastases. Probable additional lytic lesion within the medial aspects of the left tenth rib. Review of the MIP images confirms the above findings. CT ABDOMEN and PELVIS FINDINGS Hepatobiliary: Tiny hypodense foci within the left liver lobe, much too small to definitively characterize, most likely small cysts. Gallbladder appears normal. No bile duct dilatation. Pancreas: Unremarkable. No pancreatic ductal dilatation or surrounding inflammatory changes. Spleen: Normal in size without focal abnormality. Adrenals/Urinary Tract: Adrenal glands are unremarkable. Kidneys are normal, without renal calculi, focal lesion, or hydronephrosis. Bladder is unremarkable. Stomach/Bowel: Bowel is normal in caliber. No bowel wall thickening or evidence of bowel wall inflammation. Appendix is normal. Stomach is unremarkable, partially decompressed. Vascular/Lymphatic: No significant vascular findings are present. No enlarged abdominal or pelvic lymph nodes. Reproductive: Status post hysterectomy. No adnexal masses. Other: No free fluid or abscess collection. No free intraperitoneal air. No soft tissue mass seen. Musculoskeletal: Subtle small lucency within the right inferior pubic ramus, suspicious for additional metastasis. Similar lytic lesion within the upper sacrum, near the midline, again suspicious for metastasis. More convincing metastases are seen within the L4 vertebral body and right iliac bone. Review of the MIP images confirms the above findings. IMPRESSION: 1. Right pleural effusion, moderate to large in size, with adjacent compressive atelectasis. Associated pulmonary edema within the right upper lobe and right lower lobe. Left lung is clear. 2.  Heterogeneous low-density areas within the atelectasis at the right lung base, suspicious for neoplastic mass obscured by the surrounding atelectasis, a possible primary carcinoma for the osseous metastases described below. 3. No pulmonary embolism. 4. Destructive changes of the proximal right humerus, almost certainly neoplastic, metastatic versus primary carcinoma, incompletely imaged. Additional metastases identified within the thoracic spine, lumbar spine and osseous pelvis, as detailed above. 5. Mild lymphadenopathy within the mediastinum and right hilum, most likely reactive, but metastatic lymphadenopathy certainly not excluded given the osseous metastases described above. 6. No acute intra-abdominal or intrapelvic abnormality. No bowel obstruction or evidence of bowel wall inflammation. No evidence of acute solid organ abnormality. Appendix is normal. These results were called by telephone at the time of interpretation on 08/31/2017 at 8:14 pm to Dr. Blanchie Dessert , who verbally acknowledged these results. Electronically Signed   By: Franki Cabot M.D.   On: 08/31/2017 20:14   Ir Thoracentesis Asp Pleural Space W/img Guide  Result Date: September 14, 2017 INDICATION: Patient with a right humeral lytic lesion along with a right lung lesion and a right pleural effusion. Request is made for diagnostic and therapeutic thoracentesis. EXAM: ULTRASOUND GUIDED DIAGNOSTIC AND THERAPEUTIC THORACENTESIS MEDICATIONS: 2% lidocaine COMPLICATIONS: None immediate. PROCEDURE: An ultrasound guided thoracentesis was thoroughly discussed with the patient and questions answered. The benefits, risks, alternatives and complications were also discussed. The patient understands and wishes to proceed with the procedure. Written consent was obtained. Ultrasound was performed to localize and mark an adequate pocket of fluid in the right chest. The area was  then prepped and draped in the normal sterile fashion. 2% Lidocaine was used for  local anesthesia. Under ultrasound guidance a Safe-T-Centesis catheter was introduced. Thoracentesis was performed. The catheter was removed and a dressing applied. FINDINGS: A total of approximately 0.45 L of serous fluid was removed. Samples were sent to the laboratory as requested by the clinical team. IMPRESSION: Successful ultrasound guided right thoracentesis yielding 0.45 L of pleural fluid. Read by: Saverio Danker, PA-C Electronically Signed   By: Sandi Mariscal M.D.   On: 09/29/17 09:58        Scheduled Meds: . albuterol  2.5 mg Nebulization Q6H  . ipratropium  0.5 mg Nebulization Q6H  . lidocaine      . sodium chloride flush  3 mL Intravenous Q12H   Continuous Infusions: . sodium chloride    . heparin 1,100 Units/hr (September 29, 2017 1359)     LOS: 1 day    Time spent: 35 minutes.     Elmarie Shiley, MD [facility] Pager [phone]  If 7PM-7AM, please contact night-coverage [email or web address] Password [email or web address] 2017-09-29, 2:54 PM

## 2017-09-29 NOTE — Progress Notes (Signed)
  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     Indication for treatment:  Palliative       Radiation treatment dates:   09/06/17 - 09/15/17  Site/dose:   Patient was planned to receive 30 Gy in 10 fxs of 3 Gy each to the whole brain, T6 and right humerus , however, her radiotherapy was discontinued early, after 8 fractions, due to a rapid decline in her health.  She was discharged home from the hospital on 09/15/17 with Hospice care.  Beams/energy:   15X/Static to T6, 6X/10X Static to the right humerus and 6X/Static to the whole brain  Narrative: The patient tolerated radiation treatment relatively well.   She did not experience any significant side effects with treatment.  She remained an inpatient throughout her treatment and unfortunately, had a rapid decline in her condition and was discharged home from the hospital under the care of hospice.  Plan: The patient has completed radiation treatment. She was discharged home from the hospital under hospice care and therefore, we will plan to see her back on an as-needed basis only.  She was advised to call with any questions or concerns related to her recovery or treatment. ________________________________  Sheral Apley. Tammi Klippel, M.D.

## 2017-09-29 NOTE — Telephone Encounter (Signed)
I spoke with the hospitalist. Workup is in progress.  The patient probably has primary lung cancer. She has appointment to see Dr. Alen Blew Dr. Alen Blew is informed of this admission

## 2017-09-29 NOTE — Progress Notes (Signed)
ANTICOAGULATION CONSULT NOTE - Initial Consult  Pharmacy Consult for heparin Indication: DVT treatment  No Known Allergies  Patient Measurements: Height: 5\' 1"  (154.9 cm) Weight: 173 lb (78.5 kg) IBW/kg (Calculated) : 47.8 Heparin Dosing Weight: 65.4 kg  Assessment: 72 yo F presents on 1/31 with R pleural effusion and lung lesion. Was on Xarelto PTA for R arm DVT. Only on the second week of treatment dosing. Last dose was 1/31 at 0800. Held for thoracentesis on 2/1. Now plan to start heparin. Hgb 12.3, plts wnl.  Goal of Therapy:  APTT 66-102 seconds Heparin level 0.3-0.7 units/ml Monitor platelets by anticoagulation protocol: Yes   Plan:  No heparin bolus due to thoracentesis / recent Xarelto use Start heparin gtt at 1,100 units/hr Monitor daily aPTT / heparin level, CBC, s/s of bleed  Kathleen Tamm J 26-Sep-2017,1:18 PM

## 2017-09-29 NOTE — Progress Notes (Signed)
Initial Nutrition Assessment  DOCUMENTATION CODES:   Obesity unspecified  INTERVENTION:    Ensure Enlive po BID, each supplement provides 350 kcal and 20 grams of protein  NUTRITION DIAGNOSIS:   Increased nutrient needs related to catabolic illness as evidenced by estimated needs  GOAL:   Patient will meet greater than or equal to 90% of their needs  MONITOR:   PO intake, Supplement acceptance, Labs, Skin, Weight trends  REASON FOR ASSESSMENT:   Malnutrition Screening Tool  ASSESSMENT:   72 y.o. Female with newly diagnosed right arm lytic lesion and DVT on Xarelto presenting today with 2 days history of increased right arm pain, nausea and vomiting.    CT of the chest and abdomen and the ER which showed R pleural effusion with compressive lung atelectasis; heterogeneous low-density areas at right lung base suspicious for primary source of carcinoma.  S/p thoracentesis 2/1 per IR. RD unable to obtain nutrition hx at this time. PO intake poor at 30% per flowsheet records. Labs and medications reviewed. CBG 115.  NUTRITION - FOCUSED PHYSICAL EXAM:  Unable to complete at this time. Pt in pain.  Diet Order:  Diet Heart Room service appropriate? Yes; Fluid consistency: Thin  EDUCATION NEEDS:   No education needs have been identified at this time  Skin:  Skin Assessment: Reviewed RN Assessment  Last BM:  1/29  Height:   Ht Readings from Last 1 Encounters:  08/31/17 5\' 1"  (1.549 m)    Weight:   Wt Readings from Last 1 Encounters:  08/31/17 173 lb (78.5 kg)    Ideal Body Weight:  47.7 kg  BMI:  Body mass index is 32.69 kg/m.  Estimated Nutritional Needs:   Kcal:  2000-2200  Protein:  100-115 gm  Fluid:  2.0-2.2 L  Arthur Holms, RD, LDN Pager #: [phone] After-Hours Pager #: [phone]

## 2017-09-29 NOTE — Progress Notes (Signed)
ANTICOAGULATION CONSULT NOTE - Follow Up Consult  Pharmacy Consult for Heparin Indication: DVT treatment  No Known Allergies  Patient Measurements: Height: 5\' 1"  (154.9 cm) Weight: 173 lb (78.5 kg) IBW/kg (Calculated) : 47.8 Heparin Dosing Weight: 65.4 kg  Vital Signs: Temp: 99.7 F (37.6 C) (02/01 2013) Temp Source: Oral (02/01 2013) BP: 137/79 (02/01 2013) Pulse Rate: 95 (02/01 2013)  Labs: Recent Labs    08/31/17 1446 13-Sep-2017 0551 09/13/2017 2054  HGB 13.1 12.3  --   HCT 39.1 37.5  --   PLT 229 223  --   APTT  --   --  57*  HEPARINUNFRC  --   --  0.41  CREATININE 0.79  --   --     Estimated Creatinine Clearance: 63 mL/min (by C-G formula based on SCr of 0.79 mg/dL).  Assessment:  72 yo F presents on 1/31 with R pleural effusion and lung lesion. Was on Xarelto PTA for R arm DVT. Only on the second week of treatment dosing. Last Xarelto dose was 1/31 at 0800. Held for thoracentesis on 2/1.     IV heparin begun this afternoon.  First heparin level is 0.41 and aPTT 57 seconds. Heparin level likely falsely elevated due to recent Xarelto doses. aPTT is below goal.  Goal of Therapy:  Heparin level 0.3-0.7 units/ml aPTT 66-102 seconds Monitor platelets by anticoagulation protocol: Yes   Plan:   Increase heparin drip to 1250 units/hr.  Next heparin level, aPTT and CBC in am.  Xarelto on hold.  Arty Baumgartner, [place] Pager: [phone] Sep 13, 2017,10:25 PM

## 2017-09-29 NOTE — Consult Note (Addendum)
Name: Samantha [name] MRN: [id number] DOB: 30-Sep-1945    ADMISSION DATE:  08/31/2017 CONSULTATION DATE:  09-18-2017  REFERRING MD :  Dr. Tyrell Antonio  CHIEF COMPLAINT:  Nausea/ vomiting/ R arm pain  HISTORY OF PRESENT ILLNESS:   72 year old female with no significant past medical history, non-smoker, who was admitted to [facility] for intractable nausea and vomiting, and uncontrolled pain.  Patient developed right arm/ shoulder pain and nausea with vomiting in December 2018.  Treated for a UTI and found to have a lytic lesion to her right humerus.  Additionally found to have DVT in right arm and placed on Xarelto.  She is scheduled for her initial oncology visit with Dr. Alen Blew on February 20.    She presented to the ER on 1/31 with severe right arm pain despite taking percocet, nausea, vomiting, and generalized weakness. CTA  chest showed large right pleural effusion with compressive lung atelectasis with heterogeneous low-density areas at right lung base suspicious for primary source of carcinoma; additionally, lytic lesions noted on right humerus, thoracic and lumbar spine, and right pelvis.  She underwent a right thoracentesis on 2/1 with cytology pending.  Pulmonary consulted for right lung mass.    PAST MEDICAL HISTORY :   has a past medical history of Pneumonia (07/12/2013).  has a past surgical history that includes Abdominal hysterectomy and IR THORACENTESIS ASP PLEURAL SPACE W/IMG GUIDE (2017-09-18). Prior to Admission medications   Medication Sig Start Date End Date Taking? Authorizing Provider  acetaminophen (TYLENOL) 500 MG tablet Take 500 mg by mouth every 6 (six) hours as needed for moderate pain.   Yes [provider]  ondansetron (ZOFRAN ODT) 4 MG disintegrating tablet Take 1 tablet (4 mg total) by mouth every 8 (eight) hours as needed for nausea or vomiting. 08/07/17  Yes Gareth Morgan, MD  oxyCODONE-acetaminophen (PERCOCET/ROXICET) 5-325 MG tablet Take 1 tablet by mouth 2  (two) times daily as needed for moderate pain or severe pain.   Yes [provider]  Rivaroxaban 15 & 20 MG TBPK Take 1 tablet by mouth 2 (two) times daily. Take as directed on package: Start with one 15mg  tablet by mouth twice a day with food. On Day 22, switch to one 20mg  tablet once a day with food.   Yes [provider]   No Known Allergies  FAMILY HISTORY:  family history includes Diabetes Mellitus II in her mother; Hypertension in her mother; Prostate cancer in her father; Stroke in her mother. SOCIAL HISTORY:  reports that  has never smoked. she has never used smokeless tobacco. She reports that she does not drink alcohol or use drugs.  REVIEW OF SYSTEMS:   Constitutional: Negative for fever, chills, weight loss, malaise/fatigue and diaphoresis.  Respiratory: Negative for cough, hemoptysis, sputum production, shortness of breath, wheezing Cardiovascular: Negative for chest pain, palpitations, orthopnea Gastrointestinal: Negative for nausea, vomiting, abdominal pain, diarrhea, constipation, blood in stool  Musculoskeletal: Negative for right arm and shoulder pain, myalgias, back pain, joint pain and falls.  Skin: Negative for itching and rash.  Neurological: Negative for dizziness, sensory change, speech change, focal weakness, seizures, loss of consciousness, generalized weakness and headaches.  Heme:  Does not bruise/bleed easily.  SUBJECTIVE:   VITAL SIGNS: Temp:  [98.2 F (36.8 C)-98.7 F (37.1 C)] 98.2 F (36.8 C) (02/01 1318) Pulse Rate:  [81-91] 86 (02/01 1318) Resp:  [16-23] 18 (02/01 1318) BP: (130-158)/(7-94) 134/7 (02/01 1318) SpO2:  [93 %-100 %] 95 % (02/01 1318) Weight:  [  173 lb (78.5 kg)] 173 lb (78.5 kg) (01/31 1929)  PHYSICAL EXAMINATION: General:  Acutely ill appearing female in right sided shoulder guarding Neuro:  Alert and interactive, moving all ext to command HEENT:  [place]/AT, PERRL, EOM-I and MMM Cardiovascular:  RRR, Nl S1/S2 and  -M/R/G. Lungs:  CTA bilaterally, decreased BS on the right however Abdomen:  Soft, NT, ND and +BS Musculoskeletal:  -edema and -tenderness Skin:  Intac  Recent Labs  Lab 08/31/17 1446  NA 136  K 4.1  CL 103  CO2 20*  BUN 12  CREATININE 0.79  GLUCOSE 115*   Recent Labs  Lab 08/31/17 1446 09/13/2017 0551  HGB 13.1 12.3  HCT 39.1 37.5  WBC 7.9 6.3  PLT 229 223   Dg Chest 1 View  Result Date: 09-13-2017 CLINICAL DATA:  Thoracentesis. EXAM: CHEST 1 VIEW COMPARISON:  CT 08/31/2016. FINDINGS: Mediastinum and hilar structures are normal. Heart size normal. Atelectatic changes right lung base. Small right pleural effusion. No pneumothorax destructive right humeral lesion best identified on CT. IMPRESSION: 1. Persistent atelectasis/mass right lung base. Reference is made to prior CT report 08/31/2016. 2.  Destructive right humeral lesion best identified on prior CT. Electronically Signed   By: Marcello Moores  Register   On: 2017-09-13 09:39   Ct Angio Chest Pe W And/or Wo Contrast  Result Date: 08/31/2017 CLINICAL DATA:  Vomiting, chills since this morning. Right-sided body pain. Pulmonary embolism suspected, high pretest probability. EXAM: CT ANGIOGRAPHY CHEST CT ABDOMEN AND PELVIS WITH CONTRAST TECHNIQUE: Multidetector CT imaging of the chest was performed using the standard protocol during bolus administration of intravenous contrast. Multiplanar CT image reconstructions and MIPs were obtained to evaluate the vascular anatomy. Multidetector CT imaging of the abdomen and pelvis was performed using the standard protocol during bolus administration of intravenous contrast. CONTRAST:  163mL ISOVUE-370 IOPAMIDOL (ISOVUE-370) INJECTION 76% COMPARISON:  None. FINDINGS: CTA CHEST FINDINGS Cardiovascular: Some of the most peripheral segmental and subsegmental pulmonary arteries are difficult to definitively characterize due to mild patient breathing motion artifact, however, there is no pulmonary embolism  identified within the main, lobar or central segmental pulmonary arteries bilaterally. Heart size is normal. No pericardial effusion. Thoracic aorta is normal in caliber and configuration. No aneurysm or evidence of dissection. Mediastinum/Nodes: Scattered small and mildly prominent lymph nodes within the mediastinum and right hilum. Esophagus appears normal. Trachea and central bronchi are unremarkable. Lungs/Pleura: Right pleural effusion, moderate to large in size, with adjacent compressive atelectasis. Associated edema within the right upper lobe and right lower lobe. Left lung is clear. Musculoskeletal: Destructive changes of the proximal right humerus, incompletely imaged. Additional lytic foci within the T6 and T7 vertebral bodies, consistent with metastases. Probable additional lytic lesion within the medial aspects of the left tenth rib. Review of the MIP images confirms the above findings. CT ABDOMEN and PELVIS FINDINGS Hepatobiliary: Tiny hypodense foci within the left liver lobe, much too small to definitively characterize, most likely small cysts. Gallbladder appears normal. No bile duct dilatation. Pancreas: Unremarkable. No pancreatic ductal dilatation or surrounding inflammatory changes. Spleen: Normal in size without focal abnormality. Adrenals/Urinary Tract: Adrenal glands are unremarkable. Kidneys are normal, without renal calculi, focal lesion, or hydronephrosis. Bladder is unremarkable. Stomach/Bowel: Bowel is normal in caliber. No bowel wall thickening or evidence of bowel wall inflammation. Appendix is normal. Stomach is unremarkable, partially decompressed. Vascular/Lymphatic: No significant vascular findings are present. No enlarged abdominal or pelvic lymph nodes. Reproductive: Status post hysterectomy. No adnexal masses. Other: No free fluid  or abscess collection. No free intraperitoneal air. No soft tissue mass seen. Musculoskeletal: Subtle small lucency within the right inferior pubic  ramus, suspicious for additional metastasis. Similar lytic lesion within the upper sacrum, near the midline, again suspicious for metastasis. More convincing metastases are seen within the L4 vertebral body and right iliac bone. Review of the MIP images confirms the above findings. IMPRESSION: 1. Right pleural effusion, moderate to large in size, with adjacent compressive atelectasis. Associated pulmonary edema within the right upper lobe and right lower lobe. Left lung is clear. 2. Heterogeneous low-density areas within the atelectasis at the right lung base, suspicious for neoplastic mass obscured by the surrounding atelectasis, a possible primary carcinoma for the osseous metastases described below. 3. No pulmonary embolism. 4. Destructive changes of the proximal right humerus, almost certainly neoplastic, metastatic versus primary carcinoma, incompletely imaged. Additional metastases identified within the thoracic spine, lumbar spine and osseous pelvis, as detailed above. 5. Mild lymphadenopathy within the mediastinum and right hilum, most likely reactive, but metastatic lymphadenopathy certainly not excluded given the osseous metastases described above. 6. No acute intra-abdominal or intrapelvic abnormality. No bowel obstruction or evidence of bowel wall inflammation. No evidence of acute solid organ abnormality. Appendix is normal. These results were called by telephone at the time of interpretation on 08/31/2017 at 8:14 pm to Dr. Blanchie Dessert , who verbally acknowledged these results. Electronically Signed   By: Franki Cabot M.D.   On: 08/31/2017 20:14   Ct Abdomen Pelvis W Contrast  Result Date: 08/31/2017 CLINICAL DATA:  Vomiting, chills since this morning. Right-sided body pain. Pulmonary embolism suspected, high pretest probability. EXAM: CT ANGIOGRAPHY CHEST CT ABDOMEN AND PELVIS WITH CONTRAST TECHNIQUE: Multidetector CT imaging of the chest was performed using the standard protocol during bolus  administration of intravenous contrast. Multiplanar CT image reconstructions and MIPs were obtained to evaluate the vascular anatomy. Multidetector CT imaging of the abdomen and pelvis was performed using the standard protocol during bolus administration of intravenous contrast. CONTRAST:  166mL ISOVUE-370 IOPAMIDOL (ISOVUE-370) INJECTION 76% COMPARISON:  None. FINDINGS: CTA CHEST FINDINGS Cardiovascular: Some of the most peripheral segmental and subsegmental pulmonary arteries are difficult to definitively characterize due to mild patient breathing motion artifact, however, there is no pulmonary embolism identified within the main, lobar or central segmental pulmonary arteries bilaterally. Heart size is normal. No pericardial effusion. Thoracic aorta is normal in caliber and configuration. No aneurysm or evidence of dissection. Mediastinum/Nodes: Scattered small and mildly prominent lymph nodes within the mediastinum and right hilum. Esophagus appears normal. Trachea and central bronchi are unremarkable. Lungs/Pleura: Right pleural effusion, moderate to large in size, with adjacent compressive atelectasis. Associated edema within the right upper lobe and right lower lobe. Left lung is clear. Musculoskeletal: Destructive changes of the proximal right humerus, incompletely imaged. Additional lytic foci within the T6 and T7 vertebral bodies, consistent with metastases. Probable additional lytic lesion within the medial aspects of the left tenth rib. Review of the MIP images confirms the above findings. CT ABDOMEN and PELVIS FINDINGS Hepatobiliary: Tiny hypodense foci within the left liver lobe, much too small to definitively characterize, most likely small cysts. Gallbladder appears normal. No bile duct dilatation. Pancreas: Unremarkable. No pancreatic ductal dilatation or surrounding inflammatory changes. Spleen: Normal in size without focal abnormality. Adrenals/Urinary Tract: Adrenal glands are unremarkable.  Kidneys are normal, without renal calculi, focal lesion, or hydronephrosis. Bladder is unremarkable. Stomach/Bowel: Bowel is normal in caliber. No bowel wall thickening or evidence of bowel wall inflammation. Appendix is  normal. Stomach is unremarkable, partially decompressed. Vascular/Lymphatic: No significant vascular findings are present. No enlarged abdominal or pelvic lymph nodes. Reproductive: Status post hysterectomy. No adnexal masses. Other: No free fluid or abscess collection. No free intraperitoneal air. No soft tissue mass seen. Musculoskeletal: Subtle small lucency within the right inferior pubic ramus, suspicious for additional metastasis. Similar lytic lesion within the upper sacrum, near the midline, again suspicious for metastasis. More convincing metastases are seen within the L4 vertebral body and right iliac bone. Review of the MIP images confirms the above findings. IMPRESSION: 1. Right pleural effusion, moderate to large in size, with adjacent compressive atelectasis. Associated pulmonary edema within the right upper lobe and right lower lobe. Left lung is clear. 2. Heterogeneous low-density areas within the atelectasis at the right lung base, suspicious for neoplastic mass obscured by the surrounding atelectasis, a possible primary carcinoma for the osseous metastases described below. 3. No pulmonary embolism. 4. Destructive changes of the proximal right humerus, almost certainly neoplastic, metastatic versus primary carcinoma, incompletely imaged. Additional metastases identified within the thoracic spine, lumbar spine and osseous pelvis, as detailed above. 5. Mild lymphadenopathy within the mediastinum and right hilum, most likely reactive, but metastatic lymphadenopathy certainly not excluded given the osseous metastases described above. 6. No acute intra-abdominal or intrapelvic abnormality. No bowel obstruction or evidence of bowel wall inflammation. No evidence of acute solid organ  abnormality. Appendix is normal. These results were called by telephone at the time of interpretation on 08/31/2017 at 8:14 pm to Dr. Blanchie Dessert , who verbally acknowledged these results. Electronically Signed   By: Franki Cabot M.D.   On: 08/31/2017 20:14   Ir Thoracentesis Asp Pleural Space W/img Guide  Result Date: 2017/09/13 INDICATION: Patient with a right humeral lytic lesion along with a right lung lesion and a right pleural effusion. Request is made for diagnostic and therapeutic thoracentesis. EXAM: ULTRASOUND GUIDED DIAGNOSTIC AND THERAPEUTIC THORACENTESIS MEDICATIONS: 2% lidocaine COMPLICATIONS: None immediate. PROCEDURE: An ultrasound guided thoracentesis was thoroughly discussed with the patient and questions answered. The benefits, risks, alternatives and complications were also discussed. The patient understands and wishes to proceed with the procedure. Written consent was obtained. Ultrasound was performed to localize and mark an adequate pocket of fluid in the right chest. The area was then prepped and draped in the normal sterile fashion. 2% Lidocaine was used for local anesthesia. Under ultrasound guidance a Safe-T-Centesis catheter was introduced. Thoracentesis was performed. The catheter was removed and a dressing applied. FINDINGS: A total of approximately 0.45 L of serous fluid was removed. Samples were sent to the laboratory as requested by the clinical team. IMPRESSION: Successful ultrasound guided right thoracentesis yielding 0.45 L of pleural fluid. Read by: Saverio Danker, PA-C Electronically Signed   By: Sandi Mariscal M.D.   On: 09-13-2017 09:58   I reviewed CXR myself, mass evident post tap on the right lower lung  ASSESSMENT / PLAN:  Lung mass - Concern for primary site of cancer - S/p right thoracentesis on 2/1 by IR with 0.45 L of serous colored fluid P:  Cytology pending Will defer further testing till cytology results pended Repeat CT of the chest now that the  fluid has been removed Hold off bronchoscopy for now  Pleural effusion: P: No CT for now Hold further thoras for now F/u on cytology Evaluate CT for ?loculation  Hypoxemia: P: Titrate O2 for sat of 88-92%  Multiple lytic lesions Intractable nausea and vomiting Intractable pain DVT right arm  P:  Per primary team   Discussed with PCCM-NP.  Rush Farmer, M.D. [facility] Pulmonary/Critical Care Medicine. Pager: [phone]. After hours pager: [phone].  Sep 15, 2017, 1:38 PM

## 2017-09-29 DEATH — deceased

## 2017-10-04 DIAGNOSIS — C799 Secondary malignant neoplasm of unspecified site: Secondary | ICD-10-CM

## 2017-10-14 LAB — ACID FAST CULTURE WITH REFLEXED SENSITIVITIES

## 2017-10-14 LAB — ACID FAST CULTURE WITH REFLEXED SENSITIVITIES (MYCOBACTERIA): Acid Fast Culture: NEGATIVE

## 2018-11-07 IMAGING — CT CT HEAD W/O CM
3 series · 15 of 46 positions shown, 18 images · non-contrast
Comparison: MR brain 09/02/2017

CLINICAL DATA: Altered mental status, question at ethanol/substance
abuse, new diagnosis of disseminated metastatic disease, abnormal MR
brain demonstrating leptomeningeal enhancement consistent with
leptomeningeal spread of tumor

EXAM:
CT HEAD WITHOUT CONTRAST
TECHNIQUE: Contiguous axial images were obtained from the base of the skull
through the vertex without intravenous contrast. Sagittal and
coronal MPR images reconstructed from axial data set.

[Series 2: head wo · axial · 0.47mm/px · z∈[-151,-31]mm · 9 of 29 slices shown, 12 images]
[im 3/29  brain]
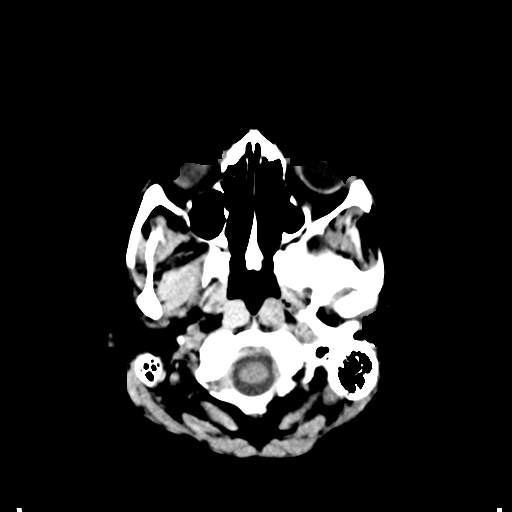
[im 3/29  bone]
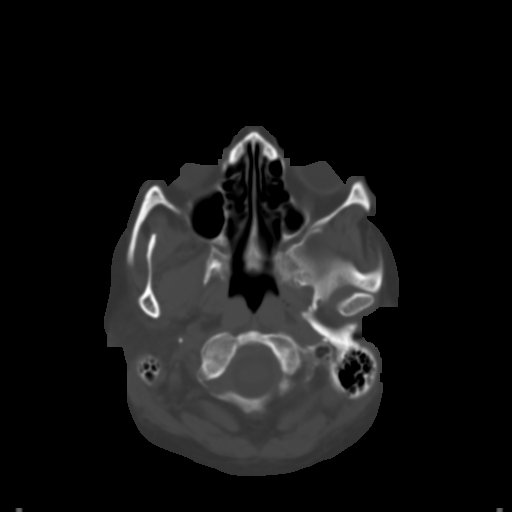
[im 6/29  brain]
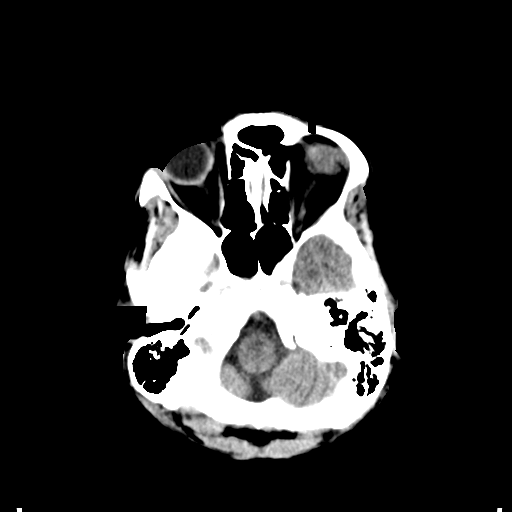
[im 9/29  brain]
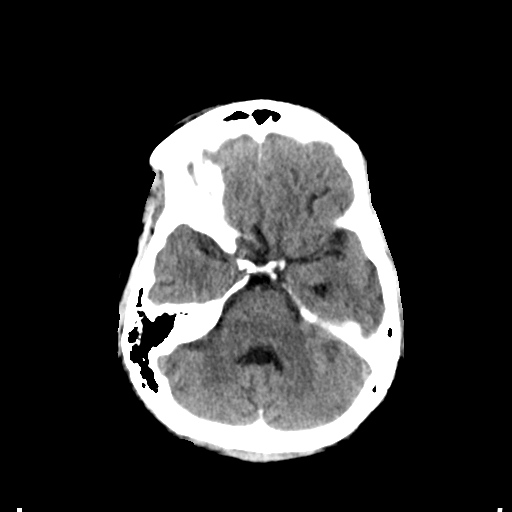
[im 12/29  brain]
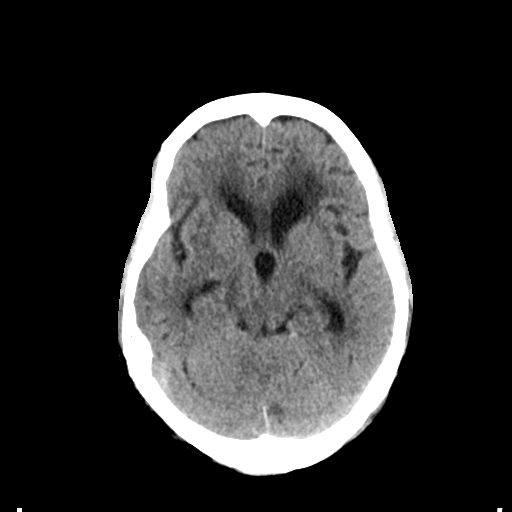
[im 15/29  brain]
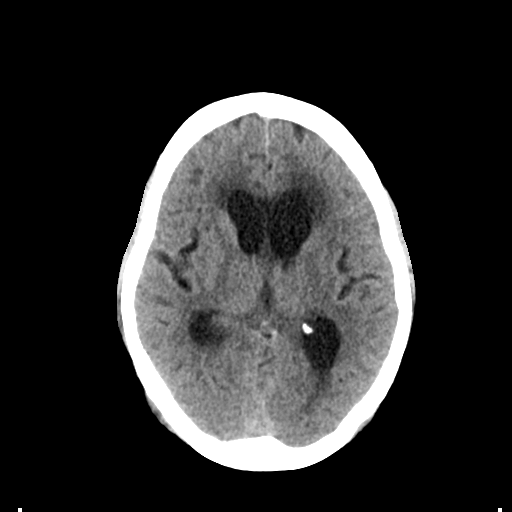
[im 15/29  bone]
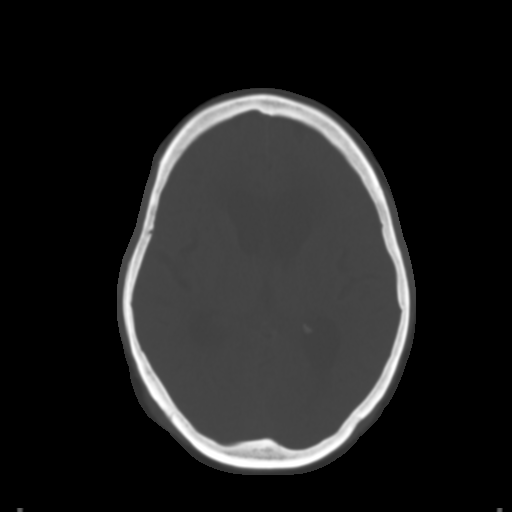
[im 18/29  brain]
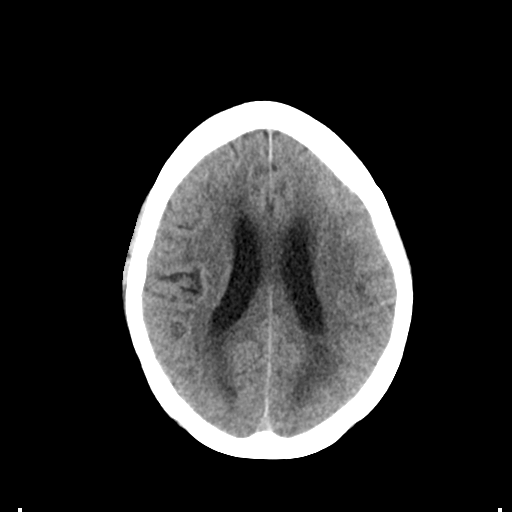
[im 21/29  brain]
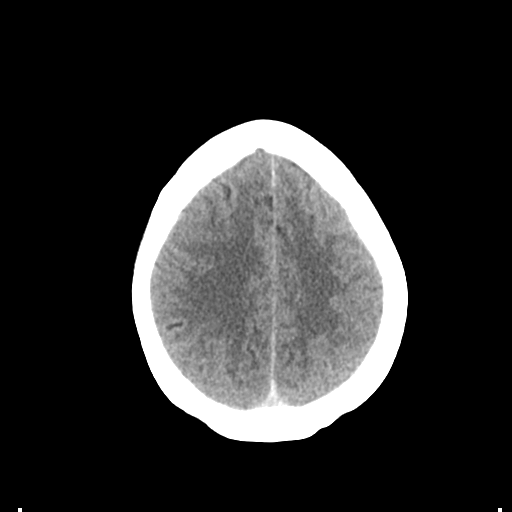
[im 24/29  brain]
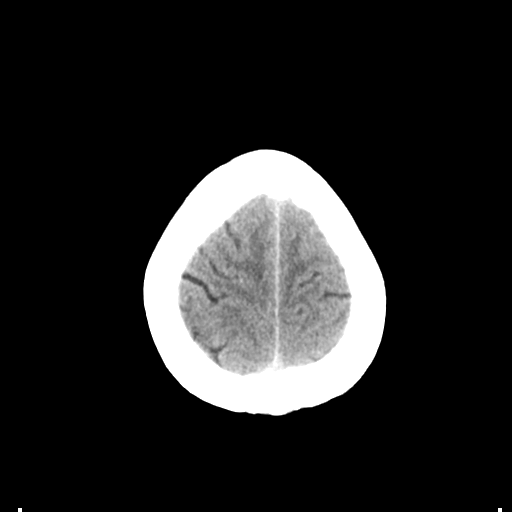
[im 27/29  brain]
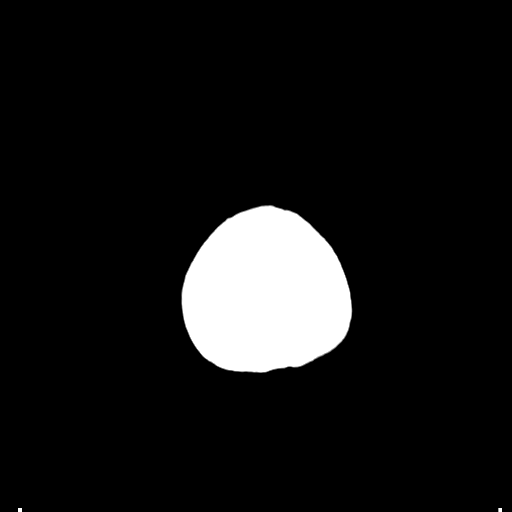
[im 27/29  bone]
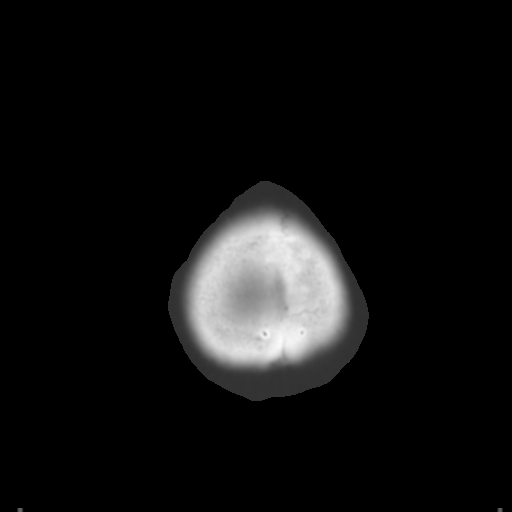

[Series 4: coronal soft tissue · coronal · 0.35mm/px · 3 of 63 slices shown]
[im 21/63  brain]
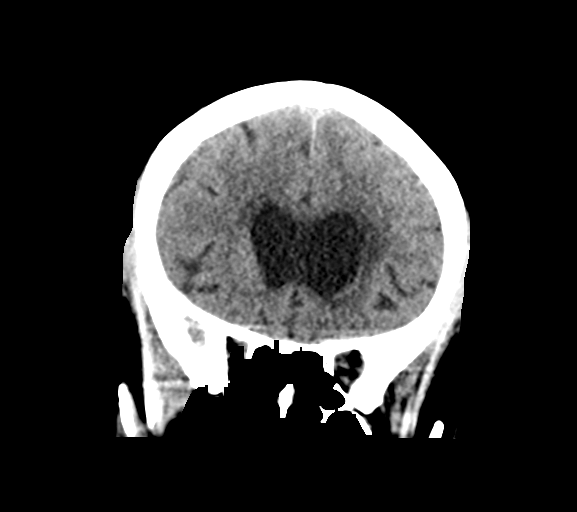
[im 28/63  brain]
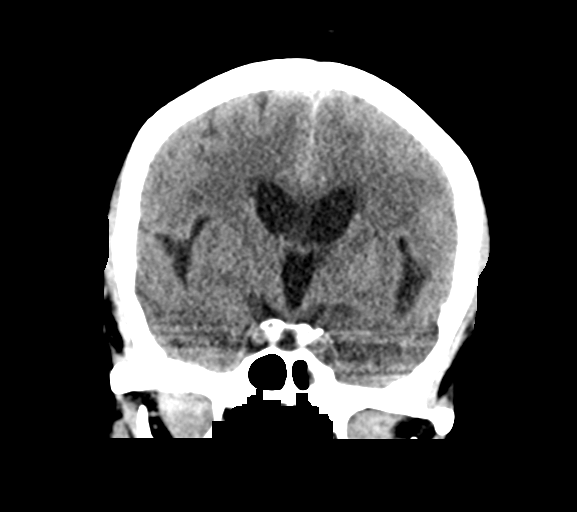
[im 35/63  brain]
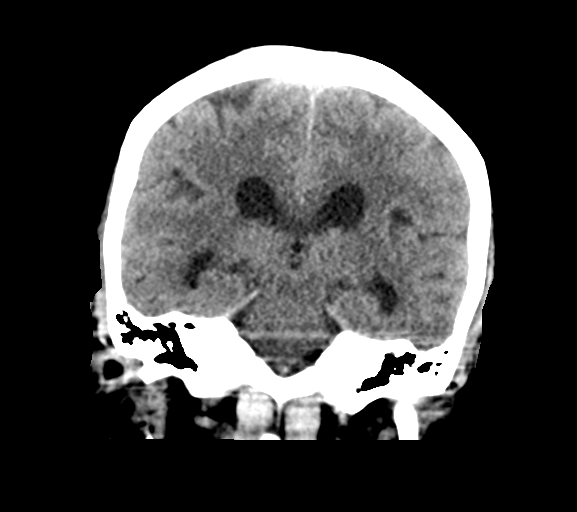

[Series 5: sagittal soft tissue · sagittal · 0.35mm/px · 3 of 48 slices shown]
[im 16/48  brain]
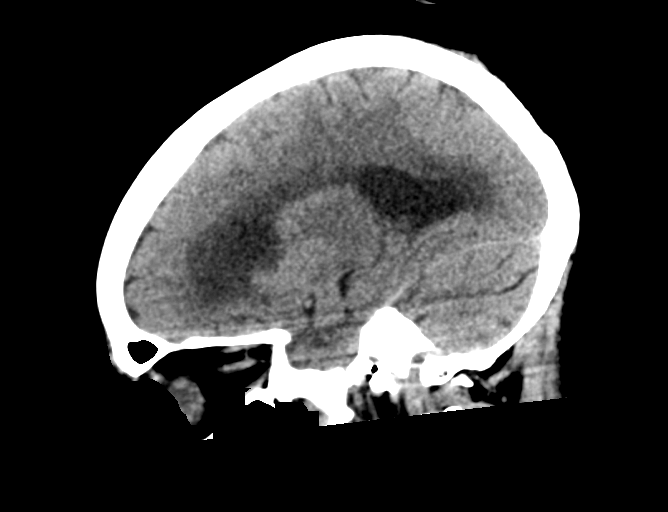
[im 24/48  brain]
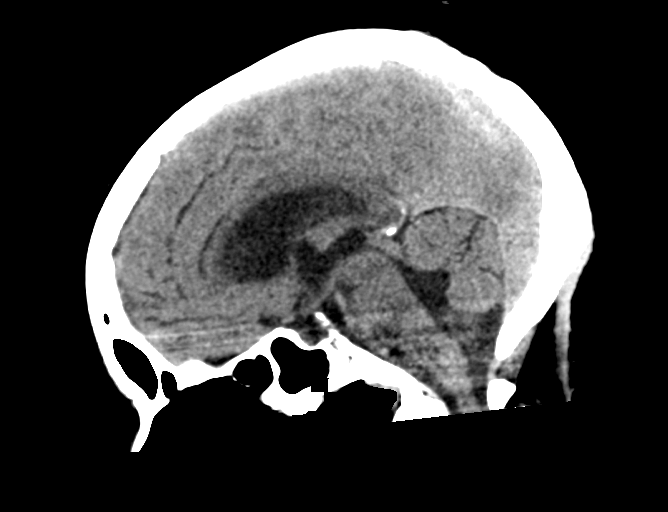
[im 32/48  brain]
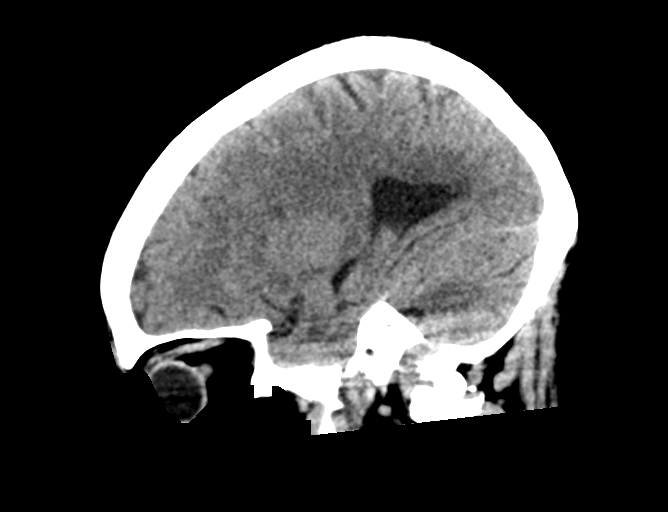

[15 of 46 positions shown; findings below may reference images not displayed]

FINDINGS: Brain: Minimally prominent ventricular system for degree of atrophy.
Mild asymmetry of lateral ventricles LEFT larger than RIGHT. No
midline shift or mass effect. Periventricular white matter
hypoattenuation especially LEFT frontal lobe which could be due to
small vessel chronic ischemic changes or transependymal CSF flow. No
intracranial hemorrhage, discrete mass lesion, or evidence of acute
infarction. No definite extra-axial fluid collections are
identified.

Vascular: Unremarkable

Skull: Intact

Sinuses/Orbits: Minimal dependent fluid in the sphenoid sinus.

Other: N/A
IMPRESSION: Mild collecting system dilatation with periventricular white matter
hypoattenuation which could represent small vessel chronic ischemic
changes or transependymal CSF flow.

No other intracranial abnormalities identified.

Specifically, no CT evidence of the leptomeningeal tumor spread as
noted on prior MR.
